# Patient Record
Sex: Female | Born: 1960
Health system: Southern US, Community
[De-identification: ages and names within clinical notes are randomized; demographics above are authoritative.]

## PROBLEM LIST (undated history)

## (undated) DIAGNOSIS — T7840XA Allergy, unspecified, initial encounter: Secondary | ICD-10-CM

## (undated) DIAGNOSIS — E079 Disorder of thyroid, unspecified: Secondary | ICD-10-CM

## (undated) DIAGNOSIS — E890 Postprocedural hypothyroidism: Secondary | ICD-10-CM

## (undated) DIAGNOSIS — G43909 Migraine, unspecified, not intractable, without status migrainosus: Secondary | ICD-10-CM

## (undated) DIAGNOSIS — E559 Vitamin D deficiency, unspecified: Secondary | ICD-10-CM

## (undated) DIAGNOSIS — K635 Polyp of colon: Secondary | ICD-10-CM

## (undated) DIAGNOSIS — E782 Mixed hyperlipidemia: Secondary | ICD-10-CM

## (undated) DIAGNOSIS — E042 Nontoxic multinodular goiter: Secondary | ICD-10-CM

## (undated) DIAGNOSIS — R7303 Prediabetes: Secondary | ICD-10-CM

## (undated) DIAGNOSIS — E049 Nontoxic goiter, unspecified: Secondary | ICD-10-CM

## (undated) DIAGNOSIS — Z8489 Family history of other specified conditions: Secondary | ICD-10-CM

## (undated) DIAGNOSIS — D649 Anemia, unspecified: Secondary | ICD-10-CM

## (undated) DIAGNOSIS — Z87442 Personal history of urinary calculi: Secondary | ICD-10-CM

## (undated) DIAGNOSIS — E0500 Thyrotoxicosis with diffuse goiter without thyrotoxic crisis or storm: Secondary | ICD-10-CM

## (undated) DIAGNOSIS — C3411 Malignant neoplasm of upper lobe, right bronchus or lung: Secondary | ICD-10-CM

## (undated) DIAGNOSIS — N200 Calculus of kidney: Secondary | ICD-10-CM

## (undated) HISTORY — DX: Nontoxic goiter, unspecified: E04.9

## (undated) HISTORY — DX: Disorder of thyroid, unspecified: E07.9

## (undated) HISTORY — PX: CHOLECYSTECTOMY: SHX55

## (undated) HISTORY — PX: ABDOMINAL HYSTERECTOMY: SHX81

## (undated) HISTORY — DX: Migraine, unspecified, not intractable, without status migrainosus: G43.909

## (undated) HISTORY — PX: LITHOTRIPSY: SUR834

## (undated) HISTORY — DX: Calculus of kidney: N20.0

## (undated) HISTORY — DX: Allergy, unspecified, initial encounter: T78.40XA

## (undated) HISTORY — PX: COLONOSCOPY: SHX174

---

## 1986-04-05 DIAGNOSIS — N200 Calculus of kidney: Secondary | ICD-10-CM

## 1986-04-05 HISTORY — DX: Calculus of kidney: N20.0

## 1998-04-05 HISTORY — PX: CHOLECYSTECTOMY: SHX55

## 2005-08-10 ENCOUNTER — Ambulatory Visit: Payer: Self-pay

## 2005-10-26 ENCOUNTER — Ambulatory Visit: Payer: Self-pay | Admitting: Gastroenterology

## 2006-09-15 ENCOUNTER — Ambulatory Visit: Payer: Self-pay

## 2007-09-19 ENCOUNTER — Ambulatory Visit: Payer: Self-pay

## 2008-09-24 ENCOUNTER — Ambulatory Visit: Payer: Self-pay

## 2009-03-10 ENCOUNTER — Ambulatory Visit: Payer: Self-pay | Admitting: Gastroenterology

## 2009-11-11 ENCOUNTER — Ambulatory Visit: Payer: Self-pay

## 2010-02-12 ENCOUNTER — Ambulatory Visit: Payer: Self-pay

## 2010-03-31 ENCOUNTER — Ambulatory Visit: Payer: Self-pay

## 2010-11-12 ENCOUNTER — Ambulatory Visit: Payer: Self-pay

## 2010-11-20 ENCOUNTER — Ambulatory Visit: Payer: Self-pay

## 2011-02-03 ENCOUNTER — Ambulatory Visit: Payer: Self-pay

## 2012-07-12 ENCOUNTER — Ambulatory Visit: Payer: Self-pay

## 2013-11-07 LAB — HM PAP SMEAR

## 2014-03-14 ENCOUNTER — Ambulatory Visit: Payer: Self-pay | Admitting: Gastroenterology

## 2014-03-14 LAB — HM COLONOSCOPY

## 2014-07-29 LAB — SURGICAL PATHOLOGY

## 2014-11-18 ENCOUNTER — Ambulatory Visit (INDEPENDENT_AMBULATORY_CARE_PROVIDER_SITE_OTHER): Payer: 59 | Admitting: Unknown Physician Specialty

## 2014-11-18 ENCOUNTER — Encounter: Payer: Self-pay | Admitting: Unknown Physician Specialty

## 2014-11-18 VITALS — BP 126/81 | HR 62 | Temp 98.2°F | Ht 67.1 in | Wt 292.0 lb

## 2014-11-18 DIAGNOSIS — R51 Headache: Secondary | ICD-10-CM

## 2014-11-18 DIAGNOSIS — E049 Nontoxic goiter, unspecified: Secondary | ICD-10-CM | POA: Insufficient documentation

## 2014-11-18 DIAGNOSIS — E039 Hypothyroidism, unspecified: Secondary | ICD-10-CM | POA: Insufficient documentation

## 2014-11-18 DIAGNOSIS — R519 Headache, unspecified: Secondary | ICD-10-CM | POA: Insufficient documentation

## 2014-11-18 DIAGNOSIS — G43909 Migraine, unspecified, not intractable, without status migrainosus: Secondary | ICD-10-CM | POA: Insufficient documentation

## 2014-11-18 DIAGNOSIS — E669 Obesity, unspecified: Secondary | ICD-10-CM

## 2014-11-18 DIAGNOSIS — K635 Polyp of colon: Secondary | ICD-10-CM

## 2014-11-18 NOTE — Progress Notes (Signed)
   BP 126/81 mmHg  Pulse 62  Temp(Src) 98.2 F (36.8 C)  Ht 5' 7.1" (1.704 m)  Wt 292 lb (132.45 kg)  BMI 45.62 kg/m2  SpO2 98%  LMP 09/17/2013 (Approximate)   Subjective:    Patient ID: Ann Robinson, female    DOB: 1960/08/14, 54 y.o.   MRN: [id number]  HPI: Ann Robinson is a 54 y.o. female  Chief Complaint  Patient presents with  . Weight Loss    pt wants to get help with weight loss   Obesity Pt has been trying to lose weight.  She has been trying since 2009.  She lost 30 pounds in the last year but 20 of it came back on.  She lost it by following weight watchers and dancing.  Her diet is now Oatmeal in the AM Lunch is a sandwich/fruit/cottage cheese, Supper is a meat and 2 vegetables and bread.     Relevant past medical, surgical, family and social history reviewed and updated as indicated. Interim medical history since our last visit reviewed. Allergies and medications reviewed and updated.  Review of Systems  Constitutional: Negative.   HENT: Negative.   Eyes: Negative.   Respiratory: Negative.   Cardiovascular: Negative.   Gastrointestinal: Negative.   Endocrine: Negative.   Genitourinary: Negative.   Musculoskeletal: Negative.   Skin: Negative.   Allergic/Immunologic: Negative.   Neurological: Negative.   Hematological: Negative.   Psychiatric/Behavioral: Negative.     Per HPI unless specifically indicated above     Objective:    BP 126/81 mmHg  Pulse 62  Temp(Src) 98.2 F (36.8 C)  Ht 5' 7.1" (1.704 m)  Wt 292 lb (132.45 kg)  BMI 45.62 kg/m2  SpO2 98%  LMP 09/17/2013 (Approximate)  Wt Readings from Last 3 Encounters:  11/18/14 292 lb (132.45 kg)  11/07/13 273 lb (123.832 kg)    Physical Exam  Constitutional: She is oriented to person, place, and time. She appears well-developed and well-nourished. No distress.  HENT:  Head: Normocephalic and atraumatic.  Eyes: Conjunctivae and lids are normal. Right eye exhibits no discharge. Left  eye exhibits no discharge. No scleral icterus.  Cardiovascular: Normal rate, regular rhythm and normal heart sounds.   Pulmonary/Chest: Effort normal and breath sounds normal. No respiratory distress.  Abdominal: Soft. Normal appearance and bowel sounds are normal. She exhibits no distension. There is no splenomegaly or hepatomegaly. There is no tenderness.  Musculoskeletal: Normal range of motion.  Neurological: She is alert and oriented to person, place, and time.  Skin: Skin is intact. No rash noted. No pallor.  Psychiatric: She has a normal mood and affect. Her behavior is normal. Judgment and thought content normal.      Assessment & Plan:   Problem List Items Addressed This Visit      Unprioritized   Obesity - Primary    Discussed diet changes with pt.  Handout given on HITT training.            Follow up plan: Return in about 3 months (around 02/18/2015) for f/u.

## 2014-11-18 NOTE — Assessment & Plan Note (Signed)
Discussed diet changes with pt.  Handout given on HITT training.

## 2014-11-18 NOTE — Patient Instructions (Signed)
Consider intermittent fasting.  Pick 2 days a week and eat 600 calories.   Consider just counting carbs and try to stay below 30 grams/day.    Think you're too busy to work out? We have the workout for you. In minutes, high-intensity interval training (H.I.I.T.) will have you sweating, breathing hard and maximizing the health benefits of exercise without the time commitment. Best of all, it's scientifically proven to work.  What Is H.I.I.T.? SHORT WORKOUTS 101 High-intensity interval training - referred to as H.I.I.T. - is based on the idea that short bursts of strenuous exercise can have a big impact on the body. If moderate exercise - like a 20-minute jog - is good for your heart, lungs and metabolism, H.I.I.T. packs the benefits of that workout and more into a few minutes. It may sound too good to be true, but learning this exercise technique and adapting it to your life can mean saving hours at the gym. If you think you don't have time to exercise, H.I.I.T. may be the workout for you.  You can try it with any aerobic activity you like. The principles of H.I.I.T. can be applied to running, biking, stair climbing, swimming, jumping rope, rowing, even hopping or skipping. (Yes, skipping!)  The downside? Even though H.I.I.T. lasts only minutes, the workouts are tough, requiring you to push your body near its limit.  HOW INTENSE IS HIGH INTENSITY? High-intensity exercise is obviously not a casual stroll down the street, but it's not a run-till-your-lungs-pop explosion, either. Think breathless, not winded. Heart-pounding, not exploding. Legs pumping, but not uncontrolled.  You don't need any fancy heart rate monitors to do these workouts. Use cues from your body as a guide. In the middle of a high-intensity workout you should be able to say single words, but not complete whole sentences. So, if you can keep chatting to your workout partner during this workout, pump it up a few notches.  01-22-29  Training This simple program will help you make the most of a short workout by improving heart health and endurance. Try it with your favorite cardiovascular activity. The essentials of 01-22-29 training are simple. Run, ride or perhaps row on a rowing machine gently for 30 seconds, accelerate to a moderate pace for 20 seconds, then sprint as hard as you can for 10 seconds. (It should be called 30-20-10 training, obviously, but that is not as catchy.) Repeat.  You don't even need a stopwatch to monitor the 30-, 20-, and 10-second time changes. You can just count to yourself, which seems to make the intervals pass more quickly.  Best of all? The grueling, all-out portion of the workout lasts for only 10 seconds. C'mon, you can do anything for 10 seconds, right?  Got 10 Minutes? A solitary minute of hard work buried in 10 minutes of activity can make a big difference.  The 10-Minute Workout If you like to run, bike, row or swim - just a little bit - this workout is a great option for you. Step 1 Warm up for 2 minutes Step 2 Pedal, run or swim all-out for 20 seconds. Repeat 2 more times Warm down for 3 minutes    GET STARTED To benefit the most from really, really short workouts, you need to build the habit of doing them into your hectic life. Ideally, you'll complete the workout three times a week. The best way to build that habit is to start small and be willing to tweak your schedule where you can  to accommodate your new workout.  First set up a spot in your house for your workout, equipped with whatever you need to get the job done: sneakers, a chair, a towel, etc. Then slot your workout in before you would normally shower. (You can even do it in the bathroom.) Or wake up five minutes earlier and do it first thing in the morning, so you can head off to work feeling accomplished. Or do it during your lunch hour. Run up your office's stairs or grab a private conference room for just a few  minutes. Or work it into your commute. If you walk or bike to work, add some heavy intervals on the way home.  GET A BOOST FROM MUSIC Creating a workout playlist of high-energy tunes you love will not make your workout feel easier, but it may cause you to exercise harder without even realizing it. Best of all, if you are doing a really short workout, you need only one or two great tunes to get you through. If you are willing to try something a bit different, make your own music as you exercise. Sing, hum, clap your hands, whatever you can do to jam along to your playlist. It may give you an extra boost to finish strong.  Find a song or podcast that's the length of your really, really short workout. By the time the song is over, you're done.  Excerpted from the [employer] Well column http://www.nytimes.com/well/guides/really-really-short-workouts?smid=fb-nytwell&smtyp=pay

## 2015-02-18 ENCOUNTER — Ambulatory Visit: Payer: 59 | Admitting: Unknown Physician Specialty

## 2015-02-25 ENCOUNTER — Ambulatory Visit (INDEPENDENT_AMBULATORY_CARE_PROVIDER_SITE_OTHER): Payer: 59 | Admitting: Unknown Physician Specialty

## 2015-02-25 ENCOUNTER — Encounter: Payer: Self-pay | Admitting: Unknown Physician Specialty

## 2015-02-25 VITALS — BP 118/73 | HR 64 | Temp 97.6°F | Ht 67.7 in | Wt 294.0 lb

## 2015-02-25 DIAGNOSIS — E669 Obesity, unspecified: Secondary | ICD-10-CM | POA: Diagnosis not present

## 2015-02-25 DIAGNOSIS — Z23 Encounter for immunization: Secondary | ICD-10-CM

## 2015-02-25 NOTE — Patient Instructions (Addendum)
Eat Fat Get Thin by Dr Mauro Kaufmann Why we get Fat and What to do About it Aleene Davidson Ketogenic diet

## 2015-02-25 NOTE — Assessment & Plan Note (Signed)
Discussed diets

## 2015-02-25 NOTE — Progress Notes (Signed)
   BP 118/73 mmHg  Pulse 64  Temp(Src) 97.6 F (36.4 C)  Ht 5' 7.7" (1.72 m)  Wt 294 lb (133.358 kg)  BMI 45.08 kg/m2  SpO2 99%  LMP 09/17/2013 (Approximate)   Subjective:    Patient ID: Ann Robinson, female    DOB: May 25, 1960, 54 y.o.   MRN: [id number]  HPI: Ann Robinson is a 54 y.o. female  Chief Complaint  Patient presents with  . Hypothyroidism   Hypothyroid Cared for by Dr Gabriel Carina  Weight Having a hard time losing weight.  She states she has had some foot problems and having trouble exercising.  She is planning on joining weight watchers.   She does not want medications.      Relevant past medical, surgical, family and social history reviewed and updated as indicated. Interim medical history since our last visit reviewed. Allergies and medications reviewed and updated.  Review of Systems  Constitutional: Negative.   HENT: Negative.   Eyes: Negative.   Respiratory: Negative.   Cardiovascular: Negative.   Gastrointestinal: Negative.   Endocrine: Negative.   Genitourinary: Negative.   Musculoskeletal: Negative.   Skin: Negative.   Allergic/Immunologic: Negative.   Neurological: Negative.   Hematological: Negative.   Psychiatric/Behavioral: Negative.     Per HPI unless specifically indicated above     Objective:    BP 118/73 mmHg  Pulse 64  Temp(Src) 97.6 F (36.4 C)  Ht 5' 7.7" (1.72 m)  Wt 294 lb (133.358 kg)  BMI 45.08 kg/m2  SpO2 99%  LMP 09/17/2013 (Approximate)  Wt Readings from Last 3 Encounters:  02/25/15 294 lb (133.358 kg)  11/18/14 292 lb (132.45 kg)  11/07/13 273 lb (123.832 kg)    Physical Exam  Constitutional: She is oriented to person, place, and time. She appears well-developed and well-nourished. No distress.  HENT:  Head: Normocephalic and atraumatic.  Eyes: Conjunctivae and lids are normal. Right eye exhibits no discharge. Left eye exhibits no discharge. No scleral icterus.  Cardiovascular: Normal rate and regular  rhythm.   Pulmonary/Chest: Effort normal and breath sounds normal. No respiratory distress.  Abdominal: Normal appearance. There is no splenomegaly or hepatomegaly.  Musculoskeletal: Normal range of motion.  Neurological: She is alert and oriented to person, place, and time.  Skin: Skin is intact. No rash noted. No pallor.  Psychiatric: She has a normal mood and affect. Her behavior is normal. Judgment and thought content normal.    Results for orders placed or performed in visit on 11/18/14  HM PAP SMEAR  Result Value Ref Range   HM Pap smear from [facility]   HM COLONOSCOPY  Result Value Ref Range   HM Colonoscopy from [facility]       Assessment & Plan:   Problem List Items Addressed This Visit      Unprioritized   Obesity    Discussed diets       Other Visit Diagnoses    Immunization due    -  Primary    Relevant Orders    Flu Vaccine QUAD 36+ mos IM (Completed)         Follow up plan: Return for schedule PE  .

## 2015-04-23 ENCOUNTER — Ambulatory Visit (INDEPENDENT_AMBULATORY_CARE_PROVIDER_SITE_OTHER): Payer: 59 | Admitting: Unknown Physician Specialty

## 2015-04-23 ENCOUNTER — Encounter: Payer: Self-pay | Admitting: Unknown Physician Specialty

## 2015-04-23 VITALS — BP 132/82 | HR 61 | Temp 97.5°F | Ht 67.2 in | Wt 304.4 lb

## 2015-04-23 DIAGNOSIS — E669 Obesity, unspecified: Secondary | ICD-10-CM | POA: Diagnosis not present

## 2015-04-23 DIAGNOSIS — E039 Hypothyroidism, unspecified: Secondary | ICD-10-CM | POA: Diagnosis not present

## 2015-04-23 DIAGNOSIS — H9192 Unspecified hearing loss, left ear: Secondary | ICD-10-CM

## 2015-04-23 DIAGNOSIS — Z0000 Encounter for general adult medical examination without abnormal findings: Secondary | ICD-10-CM

## 2015-04-23 NOTE — Assessment & Plan Note (Addendum)
Difficulty staying on diet.  Pt ed on HITT training

## 2015-04-23 NOTE — Patient Instructions (Signed)
Think you're too busy to work out? We have the workout for you. In minutes, high-intensity interval training (H.I.I.T.) will have you sweating, breathing hard and maximizing the health benefits of exercise without the time commitment. Best of all, it's scientifically proven to work.  What Is H.I.I.T.? SHORT WORKOUTS 101 High-intensity interval training - referred to as H.I.I.T. - is based on the idea that short bursts of strenuous exercise can have a big impact on the body. If moderate exercise - like a 20-minute jog - is good for your heart, lungs and metabolism, H.I.I.T. packs the benefits of that workout and more into a few minutes. It may sound too good to be true, but learning this exercise technique and adapting it to your life can mean saving hours at the gym. If you think you don't have time to exercise, H.I.I.T. may be the workout for you.  You can try it with any aerobic activity you like. The principles of H.I.I.T. can be applied to running, biking, stair climbing, swimming, jumping rope, rowing, even hopping or skipping. (Yes, skipping!)  The downside? Even though H.I.I.T. lasts only minutes, the workouts are tough, requiring you to push your body near its limit.  HOW INTENSE IS HIGH INTENSITY? High-intensity exercise is obviously not a casual stroll down the street, but it's not a run-till-your-lungs-pop explosion, either. Think breathless, not winded. Heart-pounding, not exploding. Legs pumping, but not uncontrolled.  You don't need any fancy heart rate monitors to do these workouts. Use cues from your body as a guide. In the middle of a high-intensity workout you should be able to say single words, but not complete whole sentences. So, if you can keep chatting to your workout partner during this workout, pump it up a few notches.  01-22-29 Training This simple program will help you make the most of a short workout by improving heart health and endurance. Try it with your favorite  cardiovascular activity. The essentials of 01-22-29 training are simple. Run, ride or perhaps row on a rowing machine gently for 30 seconds, accelerate to a moderate pace for 20 seconds, then sprint as hard as you can for 10 seconds. (It should be called 30-20-10 training, obviously, but that is not as catchy.) Repeat.  You don't even need a stopwatch to monitor the 30-, 20-, and 10-second time changes. You can just count to yourself, which seems to make the intervals pass more quickly.  Best of all? The grueling, all-out portion of the workout lasts for only 10 seconds. C'mon, you can do anything for 10 seconds, right?  Got 10 Minutes? A solitary minute of hard work buried in 10 minutes of activity can make a big difference.  The 10-Minute Workout If you like to run, bike, row or swim - just a little bit - this workout is a great option for you. Step 1 Warm up for 2 minutes Step 2 Pedal, run or swim all-out for 20 seconds. Repeat 2 more times Warm down for 3 minutes    GET STARTED To benefit the most from really, really short workouts, you need to build the habit of doing them into your hectic life. Ideally, you'll complete the workout three times a week. The best way to build that habit is to start small and be willing to tweak your schedule where you can to accommodate your new workout.  First set up a spot in your house for your workout, equipped with whatever you need to get the job done: sneakers, a  chair, a towel, etc. Then slot your workout in before you would normally shower. (You can even do it in the bathroom.) Or wake up five minutes earlier and do it first thing in the morning, so you can head off to work feeling accomplished. Or do it during your lunch hour. Run up your office's stairs or grab a private conference room for just a few minutes. Or work it into your commute. If you walk or bike to work, add some heavy intervals on the way home.  GET A BOOST FROM MUSIC Creating a  workout playlist of high-energy tunes you love will not make your workout feel easier, but it may cause you to exercise harder without even realizing it. Best of all, if you are doing a really short workout, you need only one or two great tunes to get you through. If you are willing to try something a bit different, make your own music as you exercise. Sing, hum, clap your hands, whatever you can do to jam along to your playlist. It may give you an extra boost to finish strong.  Find a song or podcast that's the length of your really, really short workout. By the time the song is over, you're done.  Excerpted from the [employer] Well column http://www.nytimes.com/well/guides/really-really-short-workouts?smid=fb-nytwell&smtyp=pay  

## 2015-04-23 NOTE — Progress Notes (Signed)
--+++++++++++++-  BP 132/82 mmHg  Pulse 61  Temp(Src) 97.5 F (36.4 C)  Ht 5' 7.2" (1.707 m)  Wt 304 lb 6.4 oz (138.075 kg)  BMI 47.39 kg/m2  SpO2 98%  LMP 09/17/2013 (Approximate)   Subjective:    Patient ID: Ann Robinson, female    DOB: 1961-02-03, 55 y.o.   MRN: [id number]  HPI: Ann Robinson is a 55 y.o. female  Chief Complaint  Patient presents with  . Annual Exam    pt states she has been having trouble hearing out of her left ear    Relevant past medical, surgical, family and social history reviewed and updated as indicated. Interim medical history since our last visit reviewed. Allergies and medications reviewed and updated.  Review of Systems  Constitutional: Positive for unexpected weight change.       Wants thyroid checked again  HENT:       Decreased hearing right ear  Eyes: Negative.   Respiratory: Negative.   Cardiovascular: Negative.   Gastrointestinal: Negative.   Endocrine: Negative.   Genitourinary: Negative.   Musculoskeletal: Negative.   Skin: Negative.   Allergic/Immunologic: Negative.   Neurological: Negative.   Hematological: Negative.   Psychiatric/Behavioral: Negative.     Per HPI unless specifically indicated above     Objective:    BP 132/82 mmHg  Pulse 61  Temp(Src) 97.5 F (36.4 C)  Ht 5' 7.2" (1.707 m)  Wt 304 lb 6.4 oz (138.075 kg)  BMI 47.39 kg/m2  SpO2 98%  LMP 09/17/2013 (Approximate)  Wt Readings from Last 3 Encounters:  04/23/15 304 lb 6.4 oz (138.075 kg)  02/25/15 294 lb (133.358 kg)  11/18/14 292 lb (132.45 kg)    Physical Exam  Constitutional: She is oriented to person, place, and time. She appears well-developed and well-nourished.  HENT:  Head: Normocephalic and atraumatic.  Eyes: Pupils are equal, round, and reactive to light. Right eye exhibits no discharge. Left eye exhibits no discharge. No scleral icterus.  Neck: Normal range of motion. Neck supple. Carotid bruit is not present. No thyromegaly  present.  Cardiovascular: Normal rate, regular rhythm and normal heart sounds.  Exam reveals no gallop and no friction rub.   No murmur heard. Pulmonary/Chest: Effort normal and breath sounds normal. No respiratory distress. She has no wheezes. She has no rales.  Abdominal: Soft. Bowel sounds are normal. There is no tenderness. There is no rebound.  Genitourinary: No breast swelling, tenderness or discharge.  Musculoskeletal: Normal range of motion.  Lymphadenopathy:    She has no cervical adenopathy.  Neurological: She is alert and oriented to person, place, and time.  Skin: Skin is warm, dry and intact. No rash noted.  Psychiatric: She has a normal mood and affect. Her speech is normal and behavior is normal. Judgment and thought content normal. Cognition and memory are normal.    Results for orders placed or performed in visit on 11/18/14  HM PAP SMEAR  Result Value Ref Range   HM Pap smear from [facility]   HM COLONOSCOPY  Result Value Ref Range   HM Colonoscopy from [facility]       Assessment & Plan:   Problem List Items Addressed This Visit      Unprioritized   Hypothyroidism   Relevant Orders   TSH   Obesity    Difficulty staying on diet.  Pt ed on HITT training       Other Visit Diagnoses    Routine general medical examination at a  health care facility    -  Primary    Relevant Orders    Hepatitis C antibody    HIV antibody    CBC with Differential/Platelet    Comprehensive metabolic panel    TSH    Lipid Panel w/o Chol/HDL Ratio    Hearing loss, left        Refer to ENT with normal ear exam    Relevant Orders    Ambulatory referral to ENT        Follow up plan: Return in about 1 year (around 04/22/2016).

## 2015-04-24 LAB — COMPREHENSIVE METABOLIC PANEL
ALBUMIN: 3.9 g/dL (ref 3.5–5.5)
ALK PHOS: 78 IU/L (ref 39–117)
ALT: 14 IU/L (ref 0–32)
AST: 15 IU/L (ref 0–40)
Albumin/Globulin Ratio: 1.5 (ref 1.1–2.5)
BUN/Creatinine Ratio: 10 (ref 9–23)
BUN: 6 mg/dL (ref 6–24)
Bilirubin Total: 0.2 mg/dL (ref 0.0–1.2)
CALCIUM: 9.3 mg/dL (ref 8.7–10.2)
CO2: 24 mmol/L (ref 18–29)
CREATININE: 0.59 mg/dL (ref 0.57–1.00)
Chloride: 102 mmol/L (ref 96–106)
GFR calc Af Amer: 119 mL/min/{1.73_m2} (ref >59)
GFR, EST NON AFRICAN AMERICAN: 104 mL/min/{1.73_m2} (ref >59)
GLOBULIN, TOTAL: 2.6 g/dL (ref 1.5–4.5)
GLUCOSE: 81 mg/dL (ref 65–99)
Potassium: 4.2 mmol/L (ref 3.5–5.2)
Sodium: 142 mmol/L (ref 134–144)
Total Protein: 6.5 g/dL (ref 6.0–8.5)

## 2015-04-24 LAB — CBC WITH DIFFERENTIAL/PLATELET
BASOS ABS: 0 10*3/uL (ref 0.0–0.2)
Basos: 1 %
EOS (ABSOLUTE): 0.2 10*3/uL (ref 0.0–0.4)
Eos: 3 %
HEMOGLOBIN: 13.1 g/dL (ref 11.1–15.9)
Hematocrit: 40.4 % (ref 34.0–46.6)
IMMATURE GRANS (ABS): 0 10*3/uL (ref 0.0–0.1)
Immature Granulocytes: 0 %
LYMPHS: 37 %
Lymphocytes Absolute: 2.5 10*3/uL (ref 0.7–3.1)
MCH: 26.7 pg (ref 26.6–33.0)
MCHC: 32.4 g/dL (ref 31.5–35.7)
MCV: 82 fL (ref 79–97)
MONOCYTES: 5 %
Monocytes Absolute: 0.3 10*3/uL (ref 0.1–0.9)
NEUTROS ABS: 3.7 10*3/uL (ref 1.4–7.0)
Neutrophils: 54 %
Platelets: 347 10*3/uL (ref 150–379)
RBC: 4.9 x10E6/uL (ref 3.77–5.28)
RDW: 16.1 % — ABNORMAL HIGH (ref 12.3–15.4)
WBC: 6.6 10*3/uL (ref 3.4–10.8)

## 2015-04-24 LAB — LIPID PANEL W/O CHOL/HDL RATIO
CHOLESTEROL TOTAL: 269 mg/dL — AB (ref 100–199)
HDL: 65 mg/dL (ref >39)
LDL Calculated: 192 mg/dL — ABNORMAL HIGH (ref 0–99)
TRIGLYCERIDES: 59 mg/dL (ref 0–149)
VLDL CHOLESTEROL CAL: 12 mg/dL (ref 5–40)

## 2015-04-24 LAB — HIV ANTIBODY (ROUTINE TESTING W REFLEX): HIV SCREEN 4TH GENERATION: NONREACTIVE

## 2015-04-24 LAB — TSH: TSH: 7.28 u[IU]/mL — AB (ref 0.450–4.500)

## 2015-04-24 LAB — HEPATITIS C ANTIBODY

## 2015-06-09 ENCOUNTER — Encounter: Payer: Self-pay | Admitting: Family Medicine

## 2015-06-09 ENCOUNTER — Ambulatory Visit (INDEPENDENT_AMBULATORY_CARE_PROVIDER_SITE_OTHER): Payer: 59 | Admitting: Family Medicine

## 2015-06-09 VITALS — BP 114/75 | HR 67 | Temp 98.0°F | Ht 67.9 in | Wt 308.0 lb

## 2015-06-09 DIAGNOSIS — J019 Acute sinusitis, unspecified: Secondary | ICD-10-CM

## 2015-06-09 MED ORDER — AMOXICILLIN 875 MG PO TABS: 875.0000 mg | ORAL_TABLET | Freq: Two times a day (BID) | ORAL | Status: DC

## 2015-06-09 NOTE — Progress Notes (Signed)
BP 114/75 mmHg  Pulse 67  Temp(Src) 98 F (36.7 C)  Ht 5' 7.9" (1.725 m)  Wt 308 lb (139.708 kg)  BMI 46.95 kg/m2  SpO2 99%  LMP 09/17/2013 (Approximate)   Subjective:    Patient ID: Ann Robinson, female    DOB: 1961/03/23, 55 y.o.   MRN: [id number]  HPI: Ann Robinson is a 55 y.o. female  Chief Complaint  Patient presents with  . URI    x 1 week - worsened over weekend   patient with sinus infection had pressure when bending over sloshing-type sensation a lot of congestion drainage cough cold fever or chills symptoms started To Slowly with Head Cold and Then Got Worse over This Last Week  Relevant past medical, surgical, family and social history reviewed and updated as indicated. Interim medical history since our last visit reviewed. Allergies and medications reviewed and updated.  Review of Systems  Constitutional: Positive for fever, chills, diaphoresis and fatigue.  HENT: Positive for congestion, postnasal drip, rhinorrhea, sinus pressure, sneezing and sore throat.   Respiratory: Positive for cough. Negative for shortness of breath.   Cardiovascular: Negative.     Per HPI unless specifically indicated above     Objective:    BP 114/75 mmHg  Pulse 67  Temp(Src) 98 F (36.7 C)  Ht 5' 7.9" (1.725 m)  Wt 308 lb (139.708 kg)  BMI 46.95 kg/m2  SpO2 99%  LMP 09/17/2013 (Approximate)  Wt Readings from Last 3 Encounters:  06/09/15 308 lb (139.708 kg)  04/23/15 304 lb 6.4 oz (138.075 kg)  02/25/15 294 lb (133.358 kg)    Physical Exam  Constitutional: She is oriented to person, place, and time. She appears well-developed and well-nourished. No distress.  HENT:  Head: Normocephalic and atraumatic.  Right Ear: Hearing and external ear normal.  Left Ear: Hearing and external ear normal.  Nose: Nose normal.  Mouth/Throat: Oropharyngeal exudate present.  Eyes: Conjunctivae and lids are normal. Right eye exhibits no discharge. Left eye exhibits no  discharge. No scleral icterus.  Neck: No tracheal deviation present. No thyromegaly present.  Cardiovascular: Normal rate, regular rhythm and normal heart sounds.   Pulmonary/Chest: Effort normal and breath sounds normal. No respiratory distress.  Musculoskeletal: Normal range of motion.  Lymphadenopathy:    She has no cervical adenopathy.  Neurological: She is alert and oriented to person, place, and time.  Skin: Skin is intact. No rash noted.  Psychiatric: She has a normal mood and affect. Her speech is normal and behavior is normal. Judgment and thought content normal. Cognition and memory are normal.    Results for orders placed or performed in visit on 04/23/15  Hepatitis C antibody  Result Value Ref Range   Hep C Virus Ab <0.1 0.0 - 0.9 s/co ratio  HIV antibody  Result Value Ref Range   HIV Screen 4th Generation wRfx Non Reactive Non Reactive  CBC with Differential/Platelet  Result Value Ref Range   WBC 6.6 3.4 - 10.8 x10E3/uL   RBC 4.90 3.77 - 5.28 x10E6/uL   Hemoglobin 13.1 11.1 - 15.9 g/dL   Hematocrit 40.4 34.0 - 46.6 %   MCV 82 79 - 97 fL   MCH 26.7 26.6 - 33.0 pg   MCHC 32.4 31.5 - 35.7 g/dL   RDW 16.1 (H) 12.3 - 15.4 %   Platelets 347 150 - 379 x10E3/uL   Neutrophils 54 %   Lymphs 37 %   Monocytes 5 %   Eos 3 %  Basos 1 %   Neutrophils Absolute 3.7 1.4 - 7.0 x10E3/uL   Lymphocytes Absolute 2.5 0.7 - 3.1 x10E3/uL   Monocytes Absolute 0.3 0.1 - 0.9 x10E3/uL   EOS (ABSOLUTE) 0.2 0.0 - 0.4 x10E3/uL   Basophils Absolute 0.0 0.0 - 0.2 x10E3/uL   Immature Granulocytes 0 %   Immature Grans (Abs) 0.0 0.0 - 0.1 x10E3/uL  Comprehensive metabolic panel  Result Value Ref Range   Glucose 81 65 - 99 mg/dL   BUN 6 6 - 24 mg/dL   Creatinine, Ser 0.59 0.57 - 1.00 mg/dL   GFR calc non Af Amer 104 >59 mL/min/1.73   GFR calc Af Amer 119 >59 mL/min/1.73   BUN/Creatinine Ratio 10 9 - 23   Sodium 142 134 - 144 mmol/L   Potassium 4.2 3.5 - 5.2 mmol/L   Chloride 102 96 - 106  mmol/L   CO2 24 18 - 29 mmol/L   Calcium 9.3 8.7 - 10.2 mg/dL   Total Protein 6.5 6.0 - 8.5 g/dL   Albumin 3.9 3.5 - 5.5 g/dL   Globulin, Total 2.6 1.5 - 4.5 g/dL   Albumin/Globulin Ratio 1.5 1.1 - 2.5   Bilirubin Total 0.2 0.0 - 1.2 mg/dL   Alkaline Phosphatase 78 39 - 117 IU/L   AST 15 0 - 40 IU/L   ALT 14 0 - 32 IU/L  TSH  Result Value Ref Range   TSH 7.280 (H) 0.450 - 4.500 uIU/mL  Lipid Panel w/o Chol/HDL Ratio  Result Value Ref Range   Cholesterol, Total 269 (H) 100 - 199 mg/dL   Triglycerides 59 0 - 149 mg/dL   HDL 65 >39 mg/dL   VLDL Cholesterol Cal 12 5 - 40 mg/dL   LDL Calculated 192 (H) 0 - 99 mg/dL   Comment: Comment       Assessment & Plan:   Problem List Items Addressed This Visit    None    Visit Diagnoses    Acute sinusitis, recurrence not specified, unspecified location    -  Primary    Relevant Medications    amoxicillin (AMOXIL) 875 MG tablet        Follow up plan: Return for As scheduled.

## 2015-12-12 ENCOUNTER — Ambulatory Visit: Payer: 59 | Admitting: Family Medicine

## 2015-12-15 ENCOUNTER — Ambulatory Visit: Payer: 59 | Admitting: Family Medicine

## 2015-12-17 ENCOUNTER — Ambulatory Visit (INDEPENDENT_AMBULATORY_CARE_PROVIDER_SITE_OTHER): Payer: 59 | Admitting: Family Medicine

## 2015-12-17 ENCOUNTER — Encounter: Payer: Self-pay | Admitting: Family Medicine

## 2015-12-17 VITALS — BP 103/68 | HR 58 | Temp 97.5°F | Ht 68.0 in | Wt 307.0 lb

## 2015-12-17 DIAGNOSIS — E669 Obesity, unspecified: Secondary | ICD-10-CM | POA: Diagnosis not present

## 2015-12-17 DIAGNOSIS — Z23 Encounter for immunization: Secondary | ICD-10-CM

## 2015-12-17 NOTE — Patient Instructions (Addendum)

## 2015-12-17 NOTE — Progress Notes (Signed)
BP 103/68 (BP Location: Right Arm, Patient Position: Sitting, Cuff Size: Large)   Pulse (!) 58   Temp 97.5 F (36.4 C)   Ht 5\' 8"  (1.727 m)   Wt (!) 307 lb (139.3 kg)   LMP 09/17/2013 (Approximate)   SpO2 100%   BMI 46.68 kg/m    Subjective:    Patient ID: Ann Robinson, female    DOB: [birth year], 55 y.o.   MRN: [id number]  HPI: Ann Robinson is a 55 y.o. female  Chief Complaint  Patient presents with  . weight management    pt states she needs a form signed for her job about her weight    Patient presents to discuss weight management. Has been trying to lose weight for several years with little success. Can't keep weight off when she does lose it historically. Started [employer] last week and that is going well so far. Has lost 5-7 lb over the past few weeks so far. Has not been doing much as far as physical activity due to her work hours. Is going to try and start back on the elliptical and swing dancing several times weekly. Lives alone. Does have a coworker who is also working on her weight, so they keep each other accountable and encourage each other.  Does feel fatigued and achy most of the time, but states she is already starting to have more energy with the modest weight loss she's had the past few weeks. Recently had thyroid medicine adjusted with endocrinology as well which could be playing a part.   Relevant past medical, surgical, family and social history reviewed and updated as indicated. Interim medical history since our last visit reviewed. Allergies and medications reviewed and updated.  Review of Systems  Constitutional: Positive for fatigue.  HENT: Negative.   Respiratory: Negative.   Cardiovascular: Negative.   Gastrointestinal: Negative.   Musculoskeletal: Negative.   Skin: Negative.   Neurological: Negative.   Psychiatric/Behavioral: Negative.     Per HPI unless specifically indicated above     Objective:    BP 103/68 (BP Location: Right  Arm, Patient Position: Sitting, Cuff Size: Large)   Pulse (!) 58   Temp 97.5 F (36.4 C)   Ht 5\' 8"  (1.727 m)   Wt (!) 307 lb (139.3 kg)   LMP 09/17/2013 (Approximate)   SpO2 100%   BMI 46.68 kg/m   Wt Readings from Last 3 Encounters:  12/17/15 (!) 307 lb (139.3 kg)  06/09/15 (!) 308 lb (139.7 kg)  04/23/15 (!) 304 lb 6.4 oz (138.1 kg)    Physical Exam  Constitutional: She is oriented to person, place, and time. She appears well-developed and well-nourished.  HENT:  Head: Atraumatic.  Eyes: Conjunctivae are normal. No scleral icterus.  Neck: Normal range of motion. Neck supple.  Cardiovascular: Normal rate, regular rhythm and normal heart sounds.   Pulmonary/Chest: Effort normal and breath sounds normal. No respiratory distress.  Musculoskeletal: Normal range of motion.  Neurological: She is alert and oriented to person, place, and time.  Skin: Skin is warm and dry.  Psychiatric: She has a normal mood and affect. Her behavior is normal.  Nursing note and vitals reviewed.     Assessment & Plan:   Problem List Items Addressed This Visit      Other   Obesity - Primary    Filled out paperwork for work regarding her plans to reduce her BMI. Discussed reasonable diet and exercise strategies, patient motivated and eager. Will be  getting back into dancing and using the elliptical, and discussed HIIT workouts for days when work prevents going to work out in gym.        Other Visit Diagnoses    Immunization due       Relevant Orders   Flu Vaccine QUAD 36+ mos IM (Completed)       Follow up plan: Return for as scheduled.

## 2015-12-17 NOTE — Assessment & Plan Note (Signed)
Filled out paperwork for work regarding her plans to reduce her BMI. Discussed reasonable diet and exercise strategies, patient motivated and eager. Will be getting back into dancing and using the elliptical, and discussed HIIT workouts for days when work prevents going to work out in gym.

## 2016-03-05 DIAGNOSIS — N8502 Endometrial intraepithelial neoplasia [EIN]: Secondary | ICD-10-CM

## 2016-03-05 HISTORY — DX: Endometrial intraepithelial neoplasia (EIN): N85.02

## 2016-03-17 ENCOUNTER — Inpatient Hospital Stay: Payer: 59

## 2016-03-17 ENCOUNTER — Inpatient Hospital Stay: Payer: 59 | Attending: Obstetrics and Gynecology | Admitting: Obstetrics and Gynecology

## 2016-03-17 VITALS — BP 143/95 | HR 63 | Temp 97.6°F | Ht 68.0 in | Wt 299.8 lb

## 2016-03-17 DIAGNOSIS — Z6841 Body Mass Index (BMI) 40.0 and over, adult: Secondary | ICD-10-CM | POA: Insufficient documentation

## 2016-03-17 DIAGNOSIS — Z79899 Other long term (current) drug therapy: Secondary | ICD-10-CM | POA: Diagnosis not present

## 2016-03-17 DIAGNOSIS — E049 Nontoxic goiter, unspecified: Secondary | ICD-10-CM

## 2016-03-17 DIAGNOSIS — N8502 Endometrial intraepithelial neoplasia [EIN]: Secondary | ICD-10-CM

## 2016-03-17 DIAGNOSIS — E669 Obesity, unspecified: Secondary | ICD-10-CM | POA: Diagnosis not present

## 2016-03-17 DIAGNOSIS — E039 Hypothyroidism, unspecified: Secondary | ICD-10-CM | POA: Diagnosis not present

## 2016-03-17 NOTE — H&P (Signed)
Gynecologic Oncology Consult Visit   Referring Provider: Dr. Malachy Mood, MD [facility]  Chief Concern: complex hyperplasia with atypia.  Subjective:  Ann Robinson is a 55 y.o. female who is seen in consultation from Dr. Georgianne Fick for complex hyperplasia with atypia.  She presented with postmenopausal vaginal bleeding. Evaluation included pelvic ultrasound on that was abnormal with thickened endometrial stripe. Uterus 7x4x5 cm. EMBx 9.65 mm. Left ovary normal size. Right ovary not visualized.   EMBx 03/02/2016: focal complex atypical hyperplasia probably arising in an endometrial polyp  She presents today to review management options.    Problem List: Patient Active Problem List   Diagnosis Date Noted  . Complex atypical endometrial hyperplasia 03/17/2016  . Polyp of colon 11/18/2014  . Hypothyroidism 11/18/2014  . Goiter 11/18/2014  . Headache 11/18/2014  . Migraine 11/18/2014  . Obesity 11/18/2014    Past Medical History: Past Medical History:  Diagnosis Date  . Goiter   . Migraines   . Nephrolithiasis 1988  . Thyroid disease     Past Surgical History: Past Surgical History:  Procedure Laterality Date  . CHOLECYSTECTOMY      Past Gynecologic History:  Menarche: 11 Last pap: 11/16/11 Pap negative   Health care screening: Colonoscopy nomral 01/2009, 2015  OB History:  OB History  Gravida Para Term Preterm AB Living  2 2       2   SAB TAB Ectopic Multiple Live Births               # Outcome Date GA Lbr Len/2nd Weight Sex Delivery Anes PTL Lv  2 Para           1 Para               Family History: Family History  Problem Relation Age of Onset  . Cancer Mother     colon and breast  . Diabetes Mother   . Alzheimer's disease Father   . Heart disease Father   . Hypertension Father   . Diabetes Father   . Diabetes Sister   . Hypertension Sister   . Diabetes Brother   . Liver disease Brother   . Heart disease Maternal  Grandfather     MI  . Stroke Paternal Grandmother   . Thyroid disease Sister   . Diabetes Brother     Social History: Social History   Social History  . Marital status: Widowed    Spouse name: N/A  . Number of children: N/A  . Years of education: N/A   Occupational History  . Not on file.   Social History Main Topics  . Smoking status: Never Smoker  . Smokeless tobacco: Never Used  . Alcohol use 0.0 oz/week     Comment: occasional  . Drug use: No  . Sexual activity: Yes   Other Topics Concern  . Not on file   Social History Narrative  . No narrative on file    Allergies: Allergies  Allergen Reactions  . Sulfa Antibiotics Rash    Current Medications: Current Outpatient Prescriptions  Medication Sig Dispense Refill  . cholecalciferol (VITAMIN D) 1000 UNITS tablet Take 1,000 Units by mouth 2 (two) times daily.    Marland Kitchen levothyroxine (SYNTHROID, LEVOTHROID) 125 MCG tablet Take 125 mcg by mouth daily.    . Multiple Vitamin (MULTI-VITAMINS) TABS Take 1 tablet by mouth daily.     . Omega-3 Fatty Acids (FISH OIL PO) Take by mouth daily.     No current facility-administered  medications for this visit.     Review of Systems General: no complaints  HEENT: no complaints  Lungs: no complaints  Cardiac: no complaints  GI: no complaints  GU: vaginal bleeding/spotting  Musculoskeletal: no complaints  Extremities: no complaints  Skin: no complaints  Neuro: no complaints  Endocrine: no complaints  Psych: no complaints       Objective:  Physical Examination:  BP (!) 143/95 (BP Location: Left Wrist, Patient Position: Sitting)   Pulse 63   Temp 97.6 F (36.4 C) (Tympanic)   Ht 5\' 8"  (1.727 m)   Wt 299 lb 13.2 oz (136 kg)   LMP 09/17/2013 (Approximate)   BMI 45.59 kg/m    ECOG Performance Status: 0 - Asymptomatic  General appearance: alert, cooperative and appears stated age HEENT:PERRLA, extra ocular movement intact and sclera clear, anicteric Lymph node  survey: non-palpable, axillary, inguinal, supraclavicular Cardiovascular: regular rate and rhythm Respiratory: normal air entry, lungs clear to auscultation Abdomen: soft, non-tender, without masses or organomegaly, protuberant, no hernias and well healed incision Back: inspection of back is normal Extremities: extremities normal, atraumatic, no cyanosis or edema Skin exam - normal coloration and turgor, no rashes, no suspicious skin lesions noted. Neurological exam reveals alert, oriented, normal speech, no focal findings or movement disorder noted.  Pelvic: exam chaperoned by nurse;  Vulva: normal appearing vulva with no masses, tenderness or lesions; Vagina: normal vagina; Adnexa: no masses, but limited by exam; Uterus: uterus is normal size, and nontender; Cervix: no lesions; Rectal: not indicated   Lab Review Labs on site today: [facility] ordered     Assessment:  Ann Robinson is a 55 y.o. female diagnosed with complex hyperplasia with atypia.    Medical co-morbidities complicating care: Body mass index is 45.59 kg/m.  Plan:   Problem List Items Addressed This Visit      Genitourinary   Complex atypical endometrial hyperplasia - Primary   Relevant Orders   Follicle stimulating hormone      We discussed options for management including surgery versus hormonal therapy. Based on completed fertility and desire for definitive therapy, I recommended definitive surgical evaluation with robotic assisted laparoscopic hysterectomy. She would like to preserve her ovaries as possible, she does not feel that she has completed menopause and would like to preserve hormonal function. Therefore if frozen negative for malignancy plan for bilateral salpingectomy, and if positive for malignancy then BSO. We will check [place] to assess for residual function as this may change her mind. Plan for SLN injection and mapping, with biopsy pending on frozen section results.   Risks were discussed in detail.  These include infection, anesthesia, bleeding, transfusion, wound separation, vaginal cuff dehiscence, medical issues (blood clots, stroke, heart attack, fluid in the lungs, pneumonia, abnormal heart rhythm, death), possible exploratory surgery with larger incision, lymphedema, lymphocyst, allergic reaction, injury to adjacent organs (bowel, bladder, blood vessels, nerves, ureters, uterus). Plan for OR 04/22/2015. I did offer her a progesterone therapy to prevent progression of CAH. She was given precautions today.   Suggested return to clinic in  4 weeks postop if she has a malignancy, otherwise she will follow up with Dr. Georgianne Fick.     We discussed that the etiology of this abnormality is due to obesity.   The patient's diagnosis, an outline of the further diagnostic and laboratory studies which will be required, the recommendation, and alternatives were discussed.  All questions were answered to the patient's satisfaction.   Gillis Ends, MD    CC:  Dr. Malachy Mood, MD [facility] [phone]

## 2016-03-17 NOTE — Progress Notes (Signed)
Gynecologic Oncology Consult Visit   Referring Provider: Dr. Malachy Mood, MD [facility]  Chief Concern: complex hyperplasia with atypia.  Subjective:  Ann Robinson is a 55 y.o. female who is seen in consultation from Dr. Georgianne Fick for complex hyperplasia with atypia.  She presented with postmenopausal vaginal bleeding. Evaluation included pelvic ultrasound on that was abnormal with thickened endometrial stripe. Uterus 7x4x5 cm. EMBx 9.65 mm. Left ovary normal size. Right ovary not visualized.   EMBx 03/02/2016: focal complex atypical hyperplasia probably arising in an endometrial polyp  She presents today to review management options.    Problem List: Patient Active Problem List   Diagnosis Date Noted  . Complex atypical endometrial hyperplasia 03/17/2016  . Polyp of colon 11/18/2014  . Hypothyroidism 11/18/2014  . Goiter 11/18/2014  . Headache 11/18/2014  . Migraine 11/18/2014  . Obesity 11/18/2014    Past Medical History: Past Medical History:  Diagnosis Date  . Goiter   . Migraines   . Nephrolithiasis 1988  . Thyroid disease     Past Surgical History: Past Surgical History:  Procedure Laterality Date  . CHOLECYSTECTOMY      Past Gynecologic History:  Menarche: 11 Last pap: 11/16/11 Pap negative   Health care screening: Colonoscopy nomral 01/2009, 2015  OB History:  OB History  Gravida Para Term Preterm AB Living  2 2       2   SAB TAB Ectopic Multiple Live Births               # Outcome Date GA Lbr Len/2nd Weight Sex Delivery Anes PTL Lv  2 Para           1 Para               Family History: Family History  Problem Relation Age of Onset  . Cancer Mother     colon and breast  . Diabetes Mother   . Alzheimer's disease Father   . Heart disease Father   . Hypertension Father   . Diabetes Father   . Diabetes Sister   . Hypertension Sister   . Diabetes Brother   . Liver disease Brother   . Heart disease Maternal  Grandfather     MI  . Stroke Paternal Grandmother   . Thyroid disease Sister   . Diabetes Brother     Social History: Social History   Social History  . Marital status: Widowed    Spouse name: N/A  . Number of children: N/A  . Years of education: N/A   Occupational History  . Not on file.   Social History Main Topics  . Smoking status: Never Smoker  . Smokeless tobacco: Never Used  . Alcohol use 0.0 oz/week     Comment: occasional  . Drug use: No  . Sexual activity: Yes   Other Topics Concern  . Not on file   Social History Narrative  . No narrative on file    Allergies: Allergies  Allergen Reactions  . Sulfa Antibiotics Rash    Current Medications: Current Outpatient Prescriptions  Medication Sig Dispense Refill  . cholecalciferol (VITAMIN D) 1000 UNITS tablet Take 1,000 Units by mouth 2 (two) times daily.    Marland Kitchen levothyroxine (SYNTHROID, LEVOTHROID) 125 MCG tablet Take 125 mcg by mouth daily.    . Multiple Vitamin (MULTI-VITAMINS) TABS Take 1 tablet by mouth daily.     . Omega-3 Fatty Acids (FISH OIL PO) Take by mouth daily.     No current facility-administered  medications for this visit.     Review of Systems General: no complaints  HEENT: no complaints  Lungs: no complaints  Cardiac: no complaints  GI: no complaints  GU: vaginal bleeding/spotting  Musculoskeletal: no complaints  Extremities: no complaints  Skin: no complaints  Neuro: no complaints  Endocrine: no complaints  Psych: no complaints       Objective:  Physical Examination:  BP (!) 143/95 (BP Location: Left Wrist, Patient Position: Sitting)   Pulse 63   Temp 97.6 F (36.4 C) (Tympanic)   Ht 5\' 8"  (1.727 m)   Wt 299 lb 13.2 oz (136 kg)   LMP 09/17/2013 (Approximate)   BMI 45.59 kg/m    ECOG Performance Status: 0 - Asymptomatic  General appearance: alert, cooperative and appears stated age HEENT:PERRLA, extra ocular movement intact and sclera clear, anicteric Lymph node  survey: non-palpable, axillary, inguinal, supraclavicular Cardiovascular: regular rate and rhythm Respiratory: normal air entry, lungs clear to auscultation Abdomen: soft, non-tender, without masses or organomegaly, protuberant, no hernias and well healed incision Back: inspection of back is normal Extremities: extremities normal, atraumatic, no cyanosis or edema Skin exam - normal coloration and turgor, no rashes, no suspicious skin lesions noted. Neurological exam reveals alert, oriented, normal speech, no focal findings or movement disorder noted.  Pelvic: exam chaperoned by nurse;  Vulva: normal appearing vulva with no masses, tenderness or lesions; Vagina: normal vagina; Adnexa: no masses, but limited by exam; Uterus: uterus is normal size, and nontender; Cervix: no lesions; Rectal: not indicated   Lab Review Labs on site today: [facility] ordered     Assessment:  Ann Robinson is a 55 y.o. female diagnosed with complex hyperplasia with atypia.    Medical co-morbidities complicating care: Body mass index is 45.59 kg/m.  Plan:   Problem List Items Addressed This Visit      Genitourinary   Complex atypical endometrial hyperplasia - Primary   Relevant Orders   Follicle stimulating hormone      We discussed options for management including surgery versus hormonal therapy. Based on completed fertility and desire for definitive therapy, I recommended definitive surgical evaluation with robotic assisted laparoscopic hysterectomy. She would like to preserve her ovaries as possible, she does not feel that she has completed menopause and would like to preserve hormonal function. Therefore if frozen negative for malignancy plan for bilateral salpingectomy, and if positive for malignancy then BSO. We will check [place] to assess for residual function as this may change her mind. Plan for SLN injection and mapping, with biopsy pending on frozen section results.   Risks were discussed in detail.  These include infection, anesthesia, bleeding, transfusion, wound separation, vaginal cuff dehiscence, medical issues (blood clots, stroke, heart attack, fluid in the lungs, pneumonia, abnormal heart rhythm, death), possible exploratory surgery with larger incision, lymphedema, lymphocyst, allergic reaction, injury to adjacent organs (bowel, bladder, blood vessels, nerves, ureters, uterus). Plan for OR 04/22/2015. I did offer her a progesterone therapy to prevent progression of CAH. She was given precautions today.   Suggested return to clinic in  4 weeks postop if she has a malignancy, otherwise she will follow up with Dr. Georgianne Fick.     We discussed that the etiology of this abnormality is due to obesity.   The patient's diagnosis, an outline of the further diagnostic and laboratory studies which will be required, the recommendation, and alternatives were discussed.  All questions were answered to the patient's satisfaction.   Gillis Ends, MD    CC:  Dr. Malachy Mood, MD [facility] [phone]

## 2016-03-17 NOTE — Progress Notes (Signed)
Patient here for referral. Has had spotting over the last month, tender abdomen, appetite fine, sleep no issues, no problems with bowel and bladder.Patients BP was elevated today she states that she is nervous and it is usually low/.

## 2016-03-17 NOTE — Patient Instructions (Signed)
Progesterone capsules What is this medicine? PROGESTERONE (proe JES ter one) is a female hormone. This medicine is used to prevent the overgrowth of the lining of the uterus in women who are taking estrogens for the symptoms of menopause. It is also used to treat secondary amenorrhea. This is when a woman stops getting menstrual periods due to low levels of progesterone. This medicine may be used for other purposes; ask your health care provider or pharmacist if you have questions. COMMON BRAND NAME(S): Prometrium What should I tell my health care provider before I take this medicine? They need to know if you have any of these conditions: -autoimmune disease like systemic lupus erythematosus (SLE) -blood vessel disease, blood clotting disorder, or suffered a stroke -breast, cervical or vaginal cancer -dementia -diabetes -kidney or liver disease -heart disease, high blood pressure or recent heart attack -high blood lipids or cholesterol -hysterectomy -recent miscarriage -tobacco smoker -vaginal bleeding -an unusual or allergic reaction to progesterone, peanuts, other medicines, foods, dyes, or preservatives -pregnant or trying to get pregnant -breast-feeding How should I use this medicine? Take this medicine by mouth with a glass of water. Follow the directions on the prescription label. Take your doses at regular intervals. Do not take your medicine more often than directed. Talk to your pediatrician regarding the use of this medicine in children. Special care may be needed. A patient package insert for the product will be given with each prescription and refill. Read this sheet carefully each time. The sheet may change frequently. Overdosage: If you think you have taken too much of this medicine contact a poison control center or emergency room at once. NOTE: This medicine is only for you. Do not share this medicine with others. What if I miss a dose? If you miss a dose, take it as soon  as you can. If it is almost time for your next dose, take only that dose. Do not take double or extra doses. What may interact with this medicine? Do not take this medicine with any of the following medications: -bosentan This medicine may also interact with the following medications: -barbiturate medicines for sleep or seizures -bexarotene -carbamazepine -ethotoin -ketoconazole -phenytoin -rifampin This list may not describe all possible interactions. Give your health care provider a list of all the medicines, herbs, non-prescription drugs, or dietary supplements you use. Also tell them if you smoke, drink alcohol, or use illegal drugs. Some items may interact with your medicine. What should I watch for while using this medicine? Visit your doctor or health care professional for regular checks on your progress. This medicine can cause swelling, tenderness, or bleeding of the gums. Be careful when brushing and flossing teeth. See your dentist regularly for routine dental care. You may get drowsy or dizzy. Do not drive, use machinery, or do anything that needs mental alertness until you know how this drug affects you. Do not stand or sit up quickly, especially if you are an older patient. This reduces the risk of dizzy or fainting spells. What side effects may I notice from receiving this medicine? Side effects that you should report to your doctor or health care professional as soon as possible: -allergic reactions like skin rash, itching or hives, swelling of the face, lips, or tongue -breast tissue changes or discharge -changes in vaginal bleeding during your period or between your periods -depression -muscle or bone pain -numbness or pain in the arm or leg -pain in the chest, groin or leg -seizures or tremors -severe  headache -stomach pain -sudden shortness of breath -unusually weak or tired -vision or speech problems -yellowing of skin or eyes Side effects that usually do not  require medical attention (report to your doctor or health care professional if they continue or are bothersome): -acne -fluid retention and swelling -increased in appetite -mood changes, anxiety, depression, frustration, anger, or emotional outbursts -nausea, vomiting -sweating or hot flashes This list may not describe all possible side effects. Call your doctor for medical advice about side effects. You may report side effects to FDA at 1-800-FDA-1088. Where should I keep my medicine? Keep out of the reach of children. Store at room temperature between 15 and 30 degrees C (59 and 86 degrees F). Protect from light. Keep container tightly closed. Throw away any unused medicine after the expiration date. NOTE: This sheet is a summary. It may not cover all possible information. If you have questions about this medicine, talk to your doctor, pharmacist, or health care provider.  2017 Elsevier/Gold Standard (2008-03-07 11:43:48)   Total Laparoscopic Hysterectomy, Care After Refer to this sheet in the next few weeks. These instructions provide you with information on caring for yourself after your procedure. Your health care provider may also give you more specific instructions. Your treatment has been planned according to current medical practices, but problems sometimes occur. Call your health care provider if you have any problems or questions after your procedure. What can I expect after the procedure?  Pain and bruising at the incision sites. You will be given pain medicine to control it.  Menopausal symptoms such as hot flashes, night sweats, and insomnia if your ovaries were removed.  Sore throat from the breathing tube that was inserted during surgery. Follow these instructions at home:  Only take over-the-counter or prescription medicines for pain, discomfort, or fever as directed by your health care provider.  Do not take aspirin. It can cause bleeding.  Do not drive when taking  pain medicine.  Follow your health care provider's advice regarding diet, exercise, lifting, driving, and general activities.  Resume your usual diet as directed and allowed.  Get plenty of rest and sleep.  Do not douche, use tampons, or have sexual intercourse for at least 6 weeks, or until your health care provider gives you permission.  Change your bandages (dressings) as directed by your health care provider.  Monitor your temperature and notify your health care provider of a fever.  Take showers instead of baths for 2-3 weeks.  Do not drink alcohol until your health care provider gives you permission.  If you develop constipation, you may take a mild laxative with your health care provider's permission. Bran foods may help with constipation problems. Drinking enough fluids to keep your urine clear or pale yellow may help as well.  Try to have someone home with you for 1-2 weeks to help around the house.  Keep all of your follow-up appointments as directed by your health care provider. Contact a health care provider if:  You have swelling, redness, or increasing pain around your incision sites.  You have pus coming from your incision.  You notice a bad smell coming from your incision.  Your incision breaks open.  You feel dizzy or lightheaded.  You have pain or bleeding when you urinate.  You have persistent diarrhea.  You have persistent nausea and vomiting.  You have abnormal vaginal discharge.  You have a rash.  You have any type of abnormal reaction or develop an allergy to your  medicine.  You have poor pain control with your prescribed medicine. Get help right away if:  You have chest pain or shortness of breath.  You have severe abdominal pain that is not relieved with pain medicine.  You have pain or swelling in your legs. This information is not intended to replace advice given to you by your health care provider. Make sure you discuss any questions  you have with your health care provider. Document Released: 01/10/2013 Document Revised: 08/28/2015 Document Reviewed: 10/10/2012 Elsevier Interactive Patient Education  2017 [employer].   Stop the Omega-3 Fatty Acids 5 days before surgery    Clear Liquid Diet for GYN Oncology Patients Day Before Surgery The day before your scheduled surgery DO NOT EAT any solid foods.  We do want you to drink enough liquids, but NO MILK products.  We do not want you to be dehydrated.  Clear liquids are defined as no milk products and no pieces of any solid food. The following are all approved for you to drink the day before you surgery. . Chicken, Beef or Vegetable Broth (bouillon or consomm) - NO BROTH AFTER MIDNIGHT . Plain Jello  (no fruit) . Water . Strained lemonade or fruit punch . Gatorade (any flavor) . CLEAR Ensure or Boost Breeze . Fruit juices without pulp, such as apple, grape, or cranberry juice . Clear sodas - NO SODA AFTER MIDNIGHT . Ice Pops without bits of fruit or fruit pulp . Honey . Tea or coffee without milk or cream Any foods not on the above list should be avoided.          DIVISION OF GYNECOLOGIC ONCOLOGY BOWEL PREP  The following instructions are extremely important to prepare for your surgery. Please follow them carefully  Step 1: Liquid Diet Instructions  The day before surgery, drink ONLY CLEAR LIQUIDS for breakfast, lunch, dinner and throughout the day.  Drink at least 64 oz of fluid.  CLEAR LIQUID EXAMPLES:            Beef, chicken or vegetable broth, sodas, coffee, tea (sugar, lemon  artificial sweeteners, honey are acceptable), juices (apple, grape, cranberry, any  mixture of clear juices). Kool-Aid, Gatorade, Jell-o (without fruit), popsicles    NO MILK, MILK PRODUCTS, NON-DAIRY CREAMERS   Step 2: Laxatives   The evening before surgery:   Time: around 5pm  Follow these instructions carefully.  Administer 1 Dulcolax suppository according to  manufacturer instructions on the box. You will need to purchase this laxative at a pharmacy or grocery store.   Individual responses to laxatives vary; this prep may cause multiple bowel movements. It often works in 30 minutes and may take as long as 3 hours. Stay near an available bathroom.   It is important to stay hydrated. Ensure you are still drinking clear liquids.    IMPORTANT: FOR YOUR SAFETY, WE WILL HAVE TO CANCEL YOUR SURGERY IF YOU DO NOT FOLLOW THESE INSTRUCTIONS.   Do not eat anything after midnight (including gum or candy) prior to your surgery.  Avoid drinking carbonated beverages after midnight.  You can have clear liquids up until one hour before you arrive at the hospital. "Nothing by mouth" means no liquids, gum, candy, etc for one hour before your arrival time.              Bowel Symptoms After Surgery After gynecologic surgery, women often have temporary changes in bowel function (constipation and gas pain).  Following are tips to help prevent and treat  common bowel problems.  It also tells you when to call the doctor.  This is important because some symptoms might be a sign of a more serious bowel problem such as obstruction (bowel blockage).  These problems are rare but can happen after gynecologic surgery.  Besides surgery, what can temporarily affect bowel function? 1. Dietary changes   2. Decreased physical activity   3.Antibiotics   4. Pain medication  How can I prevent constipation (three days or more without a stool)? 1. Include fiber in your diet: whole grains, raw or dried fruits & vegetables, prunes, prune/pear juiceDrink at least 8 glasses of liquid (preferably water) every day 2. Avoid: . Gas forming foods such as broccoli, beans, peas, salads, cabbage, sweet potatoes . Greasy, fatty, or fried foods 3. Activity helps bowel function return to normal, walk around the house at least 3-4 times each day for 15 minutes or longer, if tolerated.  Rocking in  a rocking chair is preferable to sitting still. 4. Stool softeners: these are not laxatives, but serve to soften the stool to avoid straining.  Take 2-4 times a day until normal bowel function returns         Examples: Colace or generic equivalent (Docusafe) 5. Bulk laxatives: provide a concentrated source of fiber.  They do not stimulate the bowel.  Take 1-2 times each day until normal bowel function return.              Examples: Citrucel, Metamucil, Fiberal, Fibercon  What can I take for "Gas Pains"? 1. Simethicone (Mylicon, Gas-X, Maalox-Gas, Mylanta-Gas) take 3-4 times a day 2. Maalox Regular - take 3-4 times a day 3. Mylanta Regular - take 3-4 times a day  What can I take if I become constipated? 1. Start with stool softeners and add additional laxatives below as needed to have a bowel movement every 1-2 days  2. Stool softeners 1-2 tablets, 2 times a day 3. Senakot 1-2 tablets, 1-2 times a day 4. Glycerin suppository can soften hard stool take once a day 5. Bisacodyl suppository once a day  6. Milk of Magnesia 30 mL 1-2 times a day 7. Fleets or tap water enema   What can I do for nausea?  1. Limit most solid foods for 24-48 hours 2. Continue eating small frequent amounts of liquids and/or bland soft foods . Toast, crackers, cooked cereal (grits, cream of wheat, rice) 3 Benadryl: a mild anti-nausea medicine can be obtained without a prescription. May cause drowsiness, especially if taken with narcotic pain medicines 4 Contact provider for prescription nausea medication    What can I do, or take for diarrhea (more than five loose stools per day)? 1. Drink plenty of clear fluids to prevent dehydration 2. May take Kaopectate, Pepto-Bismol, Immodium, or probiotics for 1-2 days 3. Annusol or Preparation-H can be helpful for hemorrhoids and irritated tissue around anus  When should I call the doctor?  CONSTIPATION:  . Not relieved after three days following the above  program VOMITING: . That contains blood, "coffee ground" material . More the three times/hour and unable to keep down nausea medication for more than eight hours . With dry mouth, dark or strong urine, feeling light-headed, dizzy, or confused . With severe abdominal pain or bloating for more than 24 hours DIARRHEA: . That continues for more then 24-48 hours despite treatment . That contains blood or tarry material . With dry mouth, dark or strong urine, feeling light~headed, dizzy, or confused FEVER: . 101 F  or higher along with nausea, vomiting, gas pain, diarrhea UNABLE TO: . Pass gas from rectum for more than 24 hours . Tolerate liquids by mouth for more than 24 hours           Laparoscopy Laparoscopy is a procedure to diagnose diseases in the abdomen. During the procedure, a thin, lighted, pencil-sized instrument called a laparoscope is inserted into the abdomen through an incision. The laparoscope allows your health care provider to look at the organs inside your body. LET [facility] CARE PROVIDER KNOW ABOUT:  Any allergies you have.  All medicines you are taking, including vitamins, herbs, eye drops, creams, and over-the-counter medicines.  Previous problems you or members of your family have had with the use of anesthetics.  Any blood disorders you have.  Previous surgeries you have had.  Medical conditions you have. RISKS AND COMPLICATIONS  Generally, this is a safe procedure. However, problems can occur, which may include:  Infection.  Bleeding.  Damage to other organs.  Allergic reaction to the anesthetics used during the procedure. BEFORE THE PROCEDURE  Do not eat or drink anything after midnight on the night before the procedure or as directed by your health care provider.  Ask your health care provider about:  Changing or stopping your regular medicines.  Taking medicines such as aspirin and ibuprofen. These medicines can thin your blood. Do not take  these medicines before your procedure if your health care provider instructs you not to.  Plan to have someone take you home after the procedure. PROCEDURE  You may be given a medicine to help you relax (sedative).  You will be given a medicine to make you sleep (general anesthetic).  Your abdomen will be inflated with a gas. This will make your organs easier to see.  Small incisions will be made in your abdomen.  A laparoscope and other small instruments will be inserted into the abdomen through the incisions.  A tissue sample may be removed from an organ in the abdomen for examination.  The instruments will be removed from the abdomen.  The gas will be released.  The incisions will be closed with stitches (sutures). AFTER THE PROCEDURE  Your blood pressure, heart rate, breathing rate, and blood oxygen level will be monitored often until the medicines you were given have worn off.   This information is not intended to replace advice given to you by your health care provider. Make sure you discuss any questions you have with your health care provider.   c.   Laparoscopy, Care After Refer to this sheet in the next few weeks. These instructions provide you with information about caring for yourself after your procedure. Your health care provider may also give you more specific instructions. Your treatment has been planned according to current medical practices, but problems sometimes occur. Call your health care provider if you have any problems or questions after your procedure. WHAT TO EXPECT AFTER THE PROCEDURE After your procedure, it is common to have mild discomfort in the throat and abdomen. HOME CARE INSTRUCTIONS  Take over-the-counter and prescription medicines only as told by your health care provider.  Do not drive for 24 hours if you received a sedative.  Return to your normal activities as told by your health care provider.  Do not take baths, swim, or use a hot  tub until your health care provider approves. You may shower.  Follow instructions from your health care provider about how to take care of your  incision. Make sure you:   Wash your hands with soap and water before you change your bandage (dressing). If soap and water are not available, use hand sanitizer.   Change your dressing as told by your health care provider.   Leave stitches (sutures), skin glue, or adhesive strips in place. These skin closures may need to stay in place for 2 weeks or longer. If adhesive strip edges start to loosen and curl up, you may trim the loose edges. Do not remove adhesive strips completely unless your health care provider tells you to do that.  Check your incision area every day for signs of infection. Check for:   More redness, swelling, or pain.   More fluid or blood.   Warmth.   Pus or a bad smell.  It is your responsibility to get the results of your procedure. Ask your health care provider or the department performing the procedure when your results will be ready. SEEK MEDICAL CARE IF:  There is new pain in your shoulders.  You feel light-headed or faint.  You are unable to pass gas or unable to have a bowel movement.  You feel nauseous or you vomit.  You develop a rash.  You have more redness, swelling, or pain around your incision.  You have more fluid or blood coming from your incision.  Your incision feels warm to the touch.  You have pus or a bad smell coming from your incision.  You have a fever or chills. SEEK IMMEDIATE MEDICAL CARE IF:  Your pain is getting worse.  You have ongoing vomiting.  The edges of your incision open up.  You have trouble breathing.  You have chest pain.   This information is not intended to replace advice given to you by your health care provider. Make sure you discuss any questions you have with your health care provider.

## 2016-03-18 ENCOUNTER — Telehealth: Payer: Self-pay

## 2016-03-18 DIAGNOSIS — N8502 Endometrial intraepithelial neoplasia [EIN]: Secondary | ICD-10-CM

## 2016-03-18 LAB — FOLLICLE STIMULATING HORMONE: FSH: 65.5 m[IU]/mL

## 2016-03-18 MED ORDER — MEGESTROL ACETATE 40 MG PO TABS: 40.0000 mg | ORAL_TABLET | Freq: Every day | ORAL | 5 refills | Status: DC

## 2016-03-18 NOTE — Telephone Encounter (Signed)
  Oncology Nurse Navigator Documentation Notified that Megace 40mg  daily has been sent to her pharmacy. Surgery has been scheduled for 04/21/16 Navigator Location: [facility] (03/18/16 1400)   )Navigator Encounter Type: Telephone (03/18/16 1400) Telephone: Outgoing Call (03/18/16 1400)     Surgery Date: 04/21/16 (03/18/16 1400)                                            Time Spent with Patient: 15 (03/18/16 1400)

## 2016-04-01 ENCOUNTER — Telehealth: Payer: Self-pay | Admitting: Obstetrics and Gynecology

## 2016-04-01 NOTE — Telephone Encounter (Signed)
Ms. Bauknecht was contacted about her [facility] level. This level is consistent with menopause and I have recommended removal of her ovaries at the time of surgery. She agrees with this plan.    Ref Range & Units 2wk ago  FSH mIU/mL 65.5   Comments: (NOTE)    Gillis Ends, MD

## 2016-04-01 NOTE — Telephone Encounter (Signed)
See documentation about [facility]

## 2016-04-14 ENCOUNTER — Encounter
Admission: RE | Admit: 2016-04-14 | Discharge: 2016-04-14 | Disposition: A | Discharge status: Home / Self Care | Payer: 59 | Source: Ambulatory Visit | Attending: Obstetrics and Gynecology | Admitting: Obstetrics and Gynecology

## 2016-04-14 DIAGNOSIS — Z01812 Encounter for preprocedural laboratory examination: Secondary | ICD-10-CM | POA: Insufficient documentation

## 2016-04-14 HISTORY — DX: Anemia, unspecified: D64.9

## 2016-04-14 HISTORY — DX: Personal history of urinary calculi: Z87.442

## 2016-04-14 LAB — CBC
HCT: 40 % (ref 35.0–47.0)
Hemoglobin: 13.4 g/dL (ref 12.0–16.0)
MCH: 28.3 pg (ref 26.0–34.0)
MCHC: 33.5 g/dL (ref 32.0–36.0)
MCV: 84.4 fL (ref 80.0–100.0)
PLATELETS: 251 10*3/uL (ref 150–440)
RBC: 4.74 MIL/uL (ref 3.80–5.20)
RDW: 15.2 % — ABNORMAL HIGH (ref 11.5–14.5)
WBC: 5.9 10*3/uL (ref 3.6–11.0)

## 2016-04-14 LAB — TYPE AND SCREEN
ABO/RH(D): O POS
Antibody Screen: NEGATIVE

## 2016-04-14 LAB — DIFFERENTIAL
BASOS ABS: 0 10*3/uL (ref 0–0.1)
BASOS PCT: 1 %
EOS ABS: 0.1 10*3/uL (ref 0–0.7)
Eosinophils Relative: 1 %
LYMPHS ABS: 2.1 10*3/uL (ref 1.0–3.6)
Lymphocytes Relative: 36 %
MONO ABS: 0.3 10*3/uL (ref 0.2–0.9)
MONOS PCT: 6 %
NEUTROS PCT: 56 %
Neutro Abs: 3.4 10*3/uL (ref 1.4–6.5)

## 2016-04-14 NOTE — Patient Instructions (Signed)
  Your procedure is scheduled on: April 21, 2016 (Wednesday) Report to Same Day Surgery 2nd floor medical mall [facility] Entrance-take elevator on left to 2nd floor.  Check in with surgery information desk.) To find out your arrival time please call [phone] between 1PM - 3PM on April 20, 2016 (Tuesday)  Remember: Instructions that are not followed completely may result in serious medical risk, up to and including death, or upon the discretion of your surgeon and anesthesiologist your surgery may need to be rescheduled.    _x___ 1. Do not eat food or drink liquids after midnight. No gum chewing or hard candies.     __x__ 2. No Alcohol for 24 hours before or after surgery.   __x__3. No Smoking for 24 prior to surgery.   ____  4. Bring all medications with you on the day of surgery if instructed.    __x__ 5. Notify your doctor if there is any change in your medical condition     (cold, fever, infections).     Do not wear jewelry, make-up, hairpins, clips or nail polish.  Do not wear lotions, powders, or perfumes. You may wear deodorant.  Do not shave 48 hours prior to surgery. Men may shave face and neck.  Do not bring valuables to the hospital.    [facility] is not responsible for any belongings or valuables.               Contacts, dentures or bridgework may not be worn into surgery.  Leave your suitcase in the car. After surgery it may be brought to your room.  For patients admitted to the hospital, discharge time is determined by your treatment team.   Patients discharged the day of surgery will not be allowed to drive home.  You will need someone to drive you home and stay with you the night of your procedure.    Please read over the following fact sheets that you were given:   [facility] Preparing for Surgery and or MRSA Information   _x___ Take these medicines the morning of surgery with A SIP OF WATER:    1. Synthroid  2.  3.  4.  5.  6.  ____Fleets  enema or Magnesium Citrate as directed.   _x___ Use CHG Soap or sage wipes as directed on instruction sheet   ____ Use inhalers on the day of surgery and bring to hospital day of surgery  ____ Stop metformin 2 days prior to surgery    ____ Take 1/2 of usual insulin dose the night before surgery and none on the morning of           surgery.   _x___ Stop Aspirin, Coumadin, Pllavix ,Eliquis, Effient, or Pradaxa (NO ASPIRIN)  x__ Stop Anti-inflammatories such as Advil, Aleve, Ibuprofen, Motrin, Naproxen,          Naprosyn, Goodies powders or aspirin products. Ok to take Tylenol.   _x__ Stop supplements until after surgery.  (STOP VITAMIN C AND FISH OIL NOW)  ____ Bring C-Pap to the hospital.

## 2016-04-20 ENCOUNTER — Telehealth: Payer: Self-pay

## 2016-04-20 NOTE — Telephone Encounter (Signed)
  Oncology Nurse Navigator Documentation Verified with Ann Robinson that she would arrive for her surgery 1/17. Navigator Location: [facility] (04/20/16 1600)   )Navigator Encounter Type: Telephone (04/20/16 1600) Telephone: Millen Call (04/20/16 1600)                                                  Time Spent with Patient: 15 (04/20/16 1600)

## 2016-04-21 ENCOUNTER — Ambulatory Visit: Payer: 59 | Admitting: Anesthesiology

## 2016-04-21 ENCOUNTER — Observation Stay
Admission: RE | Admit: 2016-04-21 | Discharge: 2016-04-22 | Disposition: A | Discharge status: Home / Self Care | Payer: 59 | Source: Ambulatory Visit | Attending: Obstetrics and Gynecology | Admitting: Obstetrics and Gynecology

## 2016-04-21 ENCOUNTER — Encounter: Payer: Self-pay | Admitting: Anesthesiology

## 2016-04-21 ENCOUNTER — Encounter
Admission: RE | Disposition: A | Discharge status: Home / Self Care | Payer: Self-pay | Source: Ambulatory Visit | Attending: Obstetrics and Gynecology

## 2016-04-21 DIAGNOSIS — N950 Postmenopausal bleeding: Secondary | ICD-10-CM | POA: Diagnosis present

## 2016-04-21 DIAGNOSIS — N840 Polyp of corpus uteri: Secondary | ICD-10-CM | POA: Diagnosis not present

## 2016-04-21 DIAGNOSIS — Z6841 Body Mass Index (BMI) 40.0 and over, adult: Secondary | ICD-10-CM | POA: Insufficient documentation

## 2016-04-21 DIAGNOSIS — Z90710 Acquired absence of both cervix and uterus: Secondary | ICD-10-CM

## 2016-04-21 DIAGNOSIS — N8502 Endometrial intraepithelial neoplasia [EIN]: Secondary | ICD-10-CM | POA: Diagnosis not present

## 2016-04-21 DIAGNOSIS — D649 Anemia, unspecified: Secondary | ICD-10-CM | POA: Diagnosis not present

## 2016-04-21 HISTORY — PX: CYSTOSCOPY: SHX5120

## 2016-04-21 HISTORY — PX: ROBOTIC ASSISTED TOTAL HYSTERECTOMY WITH BILATERAL SALPINGO OOPHERECTOMY: SHX6086

## 2016-04-21 HISTORY — PX: SENTINEL NODE BIOPSY: SHX6608

## 2016-04-21 LAB — ABO/RH: ABO/RH(D): O POS

## 2016-04-21 SURGERY — ROBOTIC ASSISTED TOTAL HYSTERECTOMY WITH BILATERAL SALPINGO OOPHORECTOMY
Anesthesia: General

## 2016-04-21 MED ORDER — LIDOCAINE HCL (CARDIAC) 20 MG/ML IV SOLN
INTRAVENOUS | Status: DC | PRN
  Administered 2016-04-21: 60 mg via INTRAVENOUS

## 2016-04-21 MED ORDER — FENTANYL CITRATE (PF) 100 MCG/2ML IJ SOLN
INTRAMUSCULAR | Status: AC
  Administered 2016-04-21: 25 ug via INTRAVENOUS
  Filled 2016-04-21: qty 2

## 2016-04-21 MED ORDER — HYDROMORPHONE HCL 1 MG/ML IJ SOLN
INTRAMUSCULAR | Status: AC
  Administered 2016-04-21: 0.5 mg via INTRAVENOUS
  Filled 2016-04-21: qty 1

## 2016-04-21 MED ORDER — GLYCOPYRROLATE 0.2 MG/ML IJ SOLN
INTRAMUSCULAR | Status: DC | PRN
  Administered 2016-04-21: 0.2 mg via INTRAVENOUS

## 2016-04-21 MED ORDER — HEMOSTATIC AGENTS (NO CHARGE) OPTIME
TOPICAL | Status: DC | PRN
  Administered 2016-04-21: 1 via TOPICAL

## 2016-04-21 MED ORDER — FAMOTIDINE 20 MG PO TABS
20.0000 mg | ORAL_TABLET | Freq: Once | ORAL | Status: AC
  Administered 2016-04-21: 20 mg via ORAL

## 2016-04-21 MED ORDER — BUPIVACAINE HCL (PF) 0.5 % IJ SOLN
INTRAMUSCULAR | Status: DC | PRN
  Administered 2016-04-21: 25 mL

## 2016-04-21 MED ORDER — MIDAZOLAM HCL 2 MG/2ML IJ SOLN
INTRAMUSCULAR | Status: DC | PRN
  Administered 2016-04-21: 2 mg via INTRAVENOUS

## 2016-04-21 MED ORDER — ONDANSETRON HCL 4 MG/2ML IJ SOLN
INTRAMUSCULAR | Status: AC
  Filled 2016-04-21: qty 2

## 2016-04-21 MED ORDER — LACTATED RINGERS IV SOLN
INTRAVENOUS | Status: DC
  Administered 2016-04-21: 11:00:00 via INTRAVENOUS

## 2016-04-21 MED ORDER — PHENYLEPHRINE 40 MCG/ML (10ML) SYRINGE FOR IV PUSH (FOR BLOOD PRESSURE SUPPORT)
PREFILLED_SYRINGE | INTRAVENOUS | Status: AC
  Filled 2016-04-21: qty 10

## 2016-04-21 MED ORDER — LIDOCAINE HCL (CARDIAC) 20 MG/ML IV SOLN: INTRAVENOUS | Status: DC | PRN

## 2016-04-21 MED ORDER — FENTANYL CITRATE (PF) 100 MCG/2ML IJ SOLN
INTRAMUSCULAR | Status: AC
  Filled 2016-04-21: qty 2

## 2016-04-21 MED ORDER — OXYCODONE-ACETAMINOPHEN 5-325 MG PO TABS
1.0000 | ORAL_TABLET | ORAL | Status: DC | PRN
  Administered 2016-04-22 (×2): 2 via ORAL
  Administered 2016-04-22: 1 via ORAL
  Filled 2016-04-21: qty 2
  Filled 2016-04-21: qty 1
  Filled 2016-04-21: qty 2

## 2016-04-21 MED ORDER — ONDANSETRON HCL 4 MG/2ML IJ SOLN
INTRAMUSCULAR | Status: DC | PRN
  Administered 2016-04-21: 4 mg via INTRAVENOUS

## 2016-04-21 MED ORDER — MENTHOL 3 MG MT LOZG
1.0000 | LOZENGE | OROMUCOSAL | Status: DC | PRN
  Administered 2016-04-21 – 2016-04-22 (×3): 3 mg via ORAL
  Filled 2016-04-21: qty 9

## 2016-04-21 MED ORDER — HYDROMORPHONE HCL 1 MG/ML IJ SOLN
INTRAMUSCULAR | Status: DC | PRN
  Administered 2016-04-21 (×2): .25 mg via INTRAVENOUS
  Administered 2016-04-21: 0.5 mg via INTRAVENOUS

## 2016-04-21 MED ORDER — ONDANSETRON HCL 4 MG PO TABS: 4.0000 mg | ORAL_TABLET | Freq: Four times a day (QID) | ORAL | Status: DC | PRN

## 2016-04-21 MED ORDER — SUGAMMADEX SODIUM 200 MG/2ML IV SOLN
INTRAVENOUS | Status: AC
Start: 2016-04-21 — End: 2016-04-21
  Filled 2016-04-21: qty 2

## 2016-04-21 MED ORDER — SUGAMMADEX SODIUM 200 MG/2ML IV SOLN
INTRAVENOUS | Status: DC | PRN
  Administered 2016-04-21: 300 mg via INTRAVENOUS

## 2016-04-21 MED ORDER — PROPOFOL 10 MG/ML IV BOLUS
INTRAVENOUS | Status: DC | PRN
  Administered 2016-04-21: 120 mg via INTRAVENOUS

## 2016-04-21 MED ORDER — KETOROLAC TROMETHAMINE 30 MG/ML IJ SOLN
INTRAMUSCULAR | Status: AC
  Filled 2016-04-21: qty 1

## 2016-04-21 MED ORDER — DEXAMETHASONE SODIUM PHOSPHATE 10 MG/ML IJ SOLN
INTRAMUSCULAR | Status: DC | PRN
  Administered 2016-04-21: 10 mg via INTRAVENOUS

## 2016-04-21 MED ORDER — PHENYLEPHRINE HCL 10 MG/ML IJ SOLN
INTRAMUSCULAR | Status: DC | PRN
  Administered 2016-04-21: 80 ug via INTRAVENOUS

## 2016-04-21 MED ORDER — SODIUM CHLORIDE 0.9 % IV SOLN
INTRAVENOUS | Status: DC
  Administered 2016-04-21 – 2016-04-22 (×2): via INTRAVENOUS

## 2016-04-21 MED ORDER — ACETAMINOPHEN 10 MG/ML IV SOLN
INTRAVENOUS | Status: AC
  Filled 2016-04-21: qty 100

## 2016-04-21 MED ORDER — HYDROMORPHONE HCL 1 MG/ML IJ SOLN
INTRAMUSCULAR | Status: AC
  Filled 2016-04-21: qty 1

## 2016-04-21 MED ORDER — ACETAMINOPHEN 10 MG/ML IV SOLN
INTRAVENOUS | Status: DC | PRN
  Administered 2016-04-21: 1000 mg via INTRAVENOUS

## 2016-04-21 MED ORDER — PROPOFOL 10 MG/ML IV BOLUS
INTRAVENOUS | Status: AC
  Filled 2016-04-21: qty 40

## 2016-04-21 MED ORDER — KETOROLAC TROMETHAMINE 30 MG/ML IJ SOLN: 30.0000 mg | Freq: Four times a day (QID) | INTRAMUSCULAR | Status: DC

## 2016-04-21 MED ORDER — INDOCYANINE GREEN 25 MG IV SOLR
INTRAVENOUS | Status: DC | PRN
  Administered 2016-04-21: 25 mg via TOPICAL

## 2016-04-21 MED ORDER — DEXAMETHASONE SODIUM PHOSPHATE 10 MG/ML IJ SOLN
INTRAMUSCULAR | Status: AC
  Filled 2016-04-21: qty 1

## 2016-04-21 MED ORDER — KETOROLAC TROMETHAMINE 30 MG/ML IJ SOLN
30.0000 mg | Freq: Four times a day (QID) | INTRAMUSCULAR | Status: DC
  Administered 2016-04-21 – 2016-04-22 (×2): 30 mg via INTRAVENOUS
  Filled 2016-04-21 (×2): qty 1

## 2016-04-21 MED ORDER — HYDROMORPHONE HCL 1 MG/ML IJ SOLN
0.5000 mg | INTRAMUSCULAR | Status: AC | PRN
  Administered 2016-04-21 (×4): 0.5 mg via INTRAVENOUS

## 2016-04-21 MED ORDER — FENTANYL CITRATE (PF) 100 MCG/2ML IJ SOLN
INTRAMUSCULAR | Status: DC | PRN
Start: 2016-04-21 — End: 2016-04-21
  Administered 2016-04-21 (×2): 50 ug via INTRAVENOUS

## 2016-04-21 MED ORDER — INDOCYANINE GREEN 25 MG IV SOLR
INTRAVENOUS | Status: AC
  Filled 2016-04-21: qty 25

## 2016-04-21 MED ORDER — ROCURONIUM BROMIDE 100 MG/10ML IV SOLN
INTRAVENOUS | Status: DC | PRN
  Administered 2016-04-21: 20 mg via INTRAVENOUS
  Administered 2016-04-21: 50 mg via INTRAVENOUS
  Administered 2016-04-21: 10 mg via INTRAVENOUS

## 2016-04-21 MED ORDER — MIDAZOLAM HCL 2 MG/2ML IJ SOLN
INTRAMUSCULAR | Status: AC
  Filled 2016-04-21: qty 2

## 2016-04-21 MED ORDER — SIMETHICONE 80 MG PO CHEW: 80.0000 mg | CHEWABLE_TABLET | Freq: Four times a day (QID) | ORAL | Status: DC | PRN

## 2016-04-21 MED ORDER — SUGAMMADEX SODIUM 200 MG/2ML IV SOLN
INTRAVENOUS | Status: AC
  Filled 2016-04-21: qty 2

## 2016-04-21 MED ORDER — KETOROLAC TROMETHAMINE 30 MG/ML IJ SOLN
INTRAMUSCULAR | Status: DC | PRN
  Administered 2016-04-21: 30 mg via INTRAVENOUS

## 2016-04-21 MED ORDER — LACTATED RINGERS IV SOLN
INTRAVENOUS | Status: DC | PRN
  Administered 2016-04-21: 13:00:00 via INTRAVENOUS

## 2016-04-21 MED ORDER — BUPIVACAINE HCL (PF) 0.5 % IJ SOLN
INTRAMUSCULAR | Status: AC
  Filled 2016-04-21: qty 30

## 2016-04-21 MED ORDER — ROCURONIUM BROMIDE 50 MG/5ML IV SOSY
PREFILLED_SYRINGE | INTRAVENOUS | Status: AC
  Filled 2016-04-21: qty 5

## 2016-04-21 MED ORDER — ONDANSETRON HCL 4 MG/2ML IJ SOLN: 4.0000 mg | Freq: Once | INTRAMUSCULAR | Status: DC | PRN

## 2016-04-21 MED ORDER — FENTANYL CITRATE (PF) 100 MCG/2ML IJ SOLN
25.0000 ug | INTRAMUSCULAR | Status: DC | PRN
  Administered 2016-04-21 (×4): 25 ug via INTRAVENOUS

## 2016-04-21 MED ORDER — DEXTROSE 5 % IV SOLN
3.0000 g | INTRAVENOUS | Status: AC
  Administered 2016-04-21: 3 g via INTRAVENOUS
  Filled 2016-04-21: qty 3000

## 2016-04-21 MED ORDER — FAMOTIDINE 20 MG PO TABS
ORAL_TABLET | ORAL | Status: AC
  Administered 2016-04-21: 20 mg via ORAL
  Filled 2016-04-21: qty 1

## 2016-04-21 MED ORDER — ONDANSETRON HCL 4 MG/2ML IJ SOLN: 4.0000 mg | Freq: Four times a day (QID) | INTRAMUSCULAR | Status: DC | PRN

## 2016-04-21 MED ORDER — LEVOTHYROXINE SODIUM 75 MCG PO TABS
125.0000 ug | ORAL_TABLET | Freq: Every day | ORAL | Status: DC
  Administered 2016-04-22: 125 ug via ORAL
  Filled 2016-04-21: qty 1

## 2016-04-21 SURGICAL SUPPLY — 73 items
BAG URO DRAIN 2000ML W/SPOUT (MISCELLANEOUS) ×3 IMPLANT
BLADE SURG SZ11 CARB STEEL (BLADE) ×3 IMPLANT
CANNULA SEALS 8.5MM (CANNULA) ×1
CATH FOLEY 2WAY  5CC 16FR (CATHETERS) ×1
CATH ROBINSON RED A/P 16FR (CATHETERS) ×3 IMPLANT
CATH URTH 16FR FL 2W BLN LF (CATHETERS) ×2 IMPLANT
CORD BIP STRL DISP 12FT (MISCELLANEOUS) ×3 IMPLANT
CORD MONOPOLAR M/FML 12FT (MISCELLANEOUS) ×3 IMPLANT
COVER LIGHT HANDLE STERIS (MISCELLANEOUS) ×3 IMPLANT
COVER TIP SHEARS 8 DVNC (MISCELLANEOUS) ×2 IMPLANT
COVER TIP SHEARS 8MM DA VINCI (MISCELLANEOUS) ×1
CRADLE LAMINECT ARM (MISCELLANEOUS) ×3 IMPLANT
DEFOGGER SCOPE WARMER CLEARIFY (MISCELLANEOUS) ×3 IMPLANT
DERMABOND ADVANCED (GAUZE/BANDAGES/DRESSINGS) ×1
DERMABOND ADVANCED .7 DNX12 (GAUZE/BANDAGES/DRESSINGS) ×2 IMPLANT
DRAPE 3 ARM ACCESS DA VINCI (DRAPES) ×1
DRAPE 3 ARM ACCESS DVNC (DRAPES) ×2 IMPLANT
DRAPE SHEET LG 3/4 BI-LAMINATE (DRAPES) ×3 IMPLANT
DRAPE UNDER BUTTOCK W/FLU (DRAPES) ×3 IMPLANT
DRESSING SURGICEL FIBRLLR 1X2 (HEMOSTASIS) ×2 IMPLANT
DRSG SURGICEL FIBRILLAR 1X2 (HEMOSTASIS) ×3
ELECT REM PT RETURN 9FT ADLT (ELECTROSURGICAL) ×3
ELECTRODE REM PT RTRN 9FT ADLT (ELECTROSURGICAL) ×2 IMPLANT
FILTER LAP SMOKE EVAC STRL (MISCELLANEOUS) ×3 IMPLANT
GLOVE BIO SURGEON STRL SZ7 (GLOVE) ×12 IMPLANT
GLOVE INDICATOR 7.5 STRL GRN (GLOVE) ×12 IMPLANT
GOWN STRL REUS W/ TWL LRG LVL3 (GOWN DISPOSABLE) ×12 IMPLANT
GOWN STRL REUS W/TWL LRG LVL3 (GOWN DISPOSABLE) ×6
GRASPER SUT TROCAR 14GX15 (MISCELLANEOUS) ×6 IMPLANT
IRRIGATION STRYKERFLOW (MISCELLANEOUS) ×4 IMPLANT
IRRIGATOR STRYKERFLOW (MISCELLANEOUS) ×6
IV NS 1000ML (IV SOLUTION) ×1
IV NS 1000ML BAXH (IV SOLUTION) ×2 IMPLANT
KIT PINK PAD W/HEAD ARE REST (MISCELLANEOUS) ×3
KIT PINK PAD W/HEAD ARM REST (MISCELLANEOUS) ×2 IMPLANT
KIT RM TURNOVER CYSTO AR (KITS) ×3 IMPLANT
LABEL OR SOLS (LABEL) ×3 IMPLANT
LIQUID BAND (GAUZE/BANDAGES/DRESSINGS) ×3 IMPLANT
MANIPULATOR VCARE LG CRV RETR (MISCELLANEOUS) ×3 IMPLANT
MANIPULATOR VCARE SML CRV RETR (MISCELLANEOUS) IMPLANT
MANIPULATOR VCARE STD CRV RETR (MISCELLANEOUS) IMPLANT
NDL INSUFF 14G 150MM VS150000 (NEEDLE) ×3 IMPLANT
NEEDLE VERESS 14GA 120MM (NEEDLE) ×3 IMPLANT
NS IRRIG 1000ML POUR BTL (IV SOLUTION) ×3 IMPLANT
OCCLUDER COLPOPNEUMO (BALLOONS) ×3 IMPLANT
PACK GYN LAPAROSCOPIC (MISCELLANEOUS) ×3 IMPLANT
PAD OB MATERNITY 4.3X12.25 (PERSONAL CARE ITEMS) ×3 IMPLANT
PAD PREP 24X41 OB/GYN DISP (PERSONAL CARE ITEMS) ×3 IMPLANT
POUCH ENDO CATCH 10MM SPEC (MISCELLANEOUS) ×3 IMPLANT
SCISSORS METZENBAUM CVD 33 (INSTRUMENTS) ×3 IMPLANT
SEAL CANN 8.5 DVNC (CANNULA) ×2 IMPLANT
SET CYSTO W/LG BORE CLAMP LF (SET/KITS/TRAYS/PACK) ×3 IMPLANT
SOLUTION ELECTROLUBE (MISCELLANEOUS) ×3 IMPLANT
SPONGE XRAY 4X4 16PLY STRL (MISCELLANEOUS) ×6 IMPLANT
SUCTION IRRIG MULTIPORT DISP (INSTRUMENTS) ×3 IMPLANT
SURGILUBE 2OZ TUBE FLIPTOP (MISCELLANEOUS) ×3 IMPLANT
SUT DVC VLOC 180 0 12IN GS21 (SUTURE) ×3
SUT ETHIBOND NAB CT1 #1 30IN (SUTURE) ×3 IMPLANT
SUT MNCRL 4-0 (SUTURE) ×3
SUT MNCRL 4-0 27XMFL (SUTURE) ×6
SUT VIC AB 0 CT2 27 (SUTURE) ×3 IMPLANT
SUT VIC AB 2-0 CT1 27 (SUTURE) ×1
SUT VIC AB 2-0 CT1 TAPERPNT 27 (SUTURE) ×2 IMPLANT
SUT VICRYL 0 AB UR-6 (SUTURE) ×12 IMPLANT
SUTURE DVC VLC 180 0 12IN GS21 (SUTURE) ×2 IMPLANT
SUTURE MNCRL 4-0 27XMF (SUTURE) ×6 IMPLANT
SYR 50ML LL SCALE MARK (SYRINGE) ×3 IMPLANT
SYRINGE 10CC LL (SYRINGE) ×3 IMPLANT
TROCAR DISP BLADELESS 8 DVNC (TROCAR) ×2 IMPLANT
TROCAR DISP BLADELESS 8MM (TROCAR) ×1
TROCAR VERSASTEP 12M LG PL (TROCAR) ×3 IMPLANT
TROCAR XCEL 12X100 BLDLESS (ENDOMECHANICALS) ×3 IMPLANT
TUBING INSUFFLATOR HEATED (MISCELLANEOUS) ×3 IMPLANT

## 2016-04-21 NOTE — Anesthesia Post-op Follow-up Note (Cosign Needed)
Anesthesia QCDR form completed.        

## 2016-04-21 NOTE — H&P (Signed)
Progress Notes Encounter Date: 03/17/2016 2:30 PM patient re-evaluated 04/21/2016 [place] Gaetana Michaelis, MD  GYN Oncology    [] Hide copied text [] Hover for attribution information Gynecologic Oncology Consult Visit   Referring Provider: Dr. Malachy Mood, MD [facility]  Chief Concern: complex hyperplasia with atypia.  Subjective:  Ann Robinson is a 56 y.o. female who is seen in consultation from Dr. Georgianne Fick for complex hyperplasia with atypia.  She presented with postmenopausal vaginal bleeding. Evaluation included pelvic ultrasound on that was abnormal with thickened endometrial stripe. Uterus 7x4x5 cm. EMBx 9.65 mm. Left ovary normal size. Right ovary not visualized.   EMBx 03/02/2016: focal complex atypical hyperplasia probably arising in an endometrial polyp  She presents today to review management options.    Problem List:     Patient Active Problem List   Diagnosis Date Noted  . Complex atypical endometrial hyperplasia 03/17/2016  . Polyp of colon 11/18/2014  . Hypothyroidism 11/18/2014  . Goiter 11/18/2014  . Headache 11/18/2014  . Migraine 11/18/2014  . Obesity 11/18/2014    Past Medical History:     Past Medical History:  Diagnosis Date  . Goiter   . Migraines   . Nephrolithiasis 1988  . Thyroid disease     Past Surgical History:      Past Surgical History:  Procedure Laterality Date  . CHOLECYSTECTOMY      Past Gynecologic History:  Menarche: 11 Last pap: 11/16/11 Pap negative   Health care screening: Colonoscopy nomral 01/2009, 2015  OB History:                  OB History  Gravida Para Term Preterm AB Living  2 2       2   SAB TAB Ectopic Multiple Live Births               # Outcome Date GA Lbr Len/2nd Weight Sex Delivery Anes PTL Lv  2 Para           1 Para               Family History:       Family History  Problem Relation Age of Onset  . Cancer Mother      colon and breast  . Diabetes Mother   . Alzheimer's disease Father   . Heart disease Father   . Hypertension Father   . Diabetes Father   . Diabetes Sister   . Hypertension Sister   . Diabetes Brother   . Liver disease Brother   . Heart disease Maternal Grandfather     MI  . Stroke Paternal Grandmother   . Thyroid disease Sister   . Diabetes Brother     Social History: Social History        Social History  . Marital status: Widowed    Spouse name: N/A  . Number of children: N/A  . Years of education: N/A      Occupational History  . Not on file.         Social History Main Topics  . Smoking status: Never Smoker  . Smokeless tobacco: Never Used  . Alcohol use 0.0 oz/week     Comment: occasional  . Drug use: No  . Sexual activity: Yes       Other Topics Concern  . Not on file      Social History Narrative  . No narrative on file    Allergies:     Allergies  Allergen Reactions  .  Sulfa Antibiotics Rash    Current Medications:       Current Outpatient Prescriptions  Medication Sig Dispense Refill  . cholecalciferol (VITAMIN D) 1000 UNITS tablet Take 1,000 Units by mouth 2 (two) times daily.    Marland Kitchen levothyroxine (SYNTHROID, LEVOTHROID) 125 MCG tablet Take 125 mcg by mouth daily.    . Multiple Vitamin (MULTI-VITAMINS) TABS Take 1 tablet by mouth daily.     . Omega-3 Fatty Acids (FISH OIL PO) Take by mouth daily.     No current facility-administered medications for this visit.     Review of Systems General: no complaints  HEENT: no complaints  Lungs: no complaints  Cardiac: no complaints  GI: no complaints  GU: vaginal bleeding/spotting  Musculoskeletal: no complaints  Extremities: no complaints  Skin: no complaints  Neuro: no complaints  Endocrine: no complaints  Psych: no complaints       Objective:  Physical Examination:  BP (!) 143/95 (BP Location: Left Wrist, Patient Position: Sitting)    Pulse 63   Temp 97.6 F (36.4 C) (Tympanic)   Ht 5\' 8"  (1.727 m)   Wt 299 lb 13.2 oz (136 kg)   LMP 09/17/2013 (Approximate)   BMI 45.59 kg/m    ECOG Performance Status: 0 - Asymptomatic  General appearance: alert, cooperative and appears stated age HEENT:PERRLA, extra ocular movement intact and sclera clear, anicteric Lymph node survey: non-palpable, axillary, inguinal, supraclavicular Cardiovascular: regular rate and rhythm Respiratory: normal air entry, lungs clear to auscultation Abdomen: soft, non-tender, without masses or organomegaly, protuberant, no hernias and well healed incision Back: inspection of back is normal Extremities: extremities normal, atraumatic, no cyanosis or edema Skin exam - normal coloration and turgor, no rashes, no suspicious skin lesions noted. Neurological exam reveals alert, oriented, normal speech, no focal findings or movement disorder noted.  Pelvic: exam chaperoned by nurse;  Vulva: normal appearing vulva with no masses, tenderness or lesions; Vagina: normal vagina; Adnexa: no masses, but limited by exam; Uterus: uterus is normal size, and nontender; Cervix: no lesions; Rectal: not indicated   Lab Review Labs on site today: [facility] ordered    Assessment:  Ann Robinson is a 56 y.o. female diagnosed with complex hyperplasia with atypia.    Medical co-morbidities complicating care: Body mass index is 45.59 kg/m.  Plan:   Problem List Items Addressed This Visit          Genitourinary   Complex atypical endometrial hyperplasia - Primary   Relevant Orders   Follicle stimulating hormone      We discussed options for management including surgery versus hormonal therapy. Based on completed fertility and desire for definitive therapy, I recommended definitive surgical evaluation with robotic assisted laparoscopic hysterectomy. She would like to preserve her ovaries as possible, she does not feel that she has completed  menopause and would like to preserve hormonal function. Therefore if frozen negative for malignancy plan for bilateral salpingectomy, and if positive for malignancy then BSO. We will check [place] to assess for residual function as this may change her mind. Plan for SLN injection and mapping, with biopsy pending on frozen section results.   Risks were discussed in detail. These include infection, anesthesia, bleeding, transfusion, wound separation, vaginal cuff dehiscence, medical issues (blood clots, stroke, heart attack, fluid in the lungs, pneumonia, abnormal heart rhythm, death), possible exploratory surgery with larger incision, lymphedema, lymphocyst, allergic reaction, injury to adjacent organs (bowel, bladder, blood vessels, nerves, ureters, uterus). Plan for OR 04/22/2015. I did offer her a  progesterone therapy to prevent progression of CAH. She was given precautions today.   Suggested return to clinic in 4 weeks postop if she has a malignancy, otherwise she will follow up with Dr. Georgianne Fick.    We discussed that the etiology of this abnormality is due to obesity.   The patient's diagnosis, an outline of the further diagnostic and laboratory studies which will be required, the recommendation, and alternatives were discussed.  All questions were answered to the patient's satisfaction.   Gillis Ends, MD    CC:  Dr. Malachy Mood, MD [facility] [phone]     Electronically signed by Gillis Ends, MD at 03/17/2016 4:18 PM    ADDENDUM: reviewed [facility] results and surgical plan.

## 2016-04-21 NOTE — Anesthesia Procedure Notes (Signed)
Procedure Name: Intubation Date/Time: 04/21/2016 12:43 PM Performed by: Darlyne Russian Pre-anesthesia Checklist: Patient identified, Emergency Drugs available, Suction available, Patient being monitored and Timeout performed Patient Re-evaluated:Patient Re-evaluated prior to inductionOxygen Delivery Method: Circle system utilized Preoxygenation: Pre-oxygenation with 100% oxygen Intubation Type: IV induction Ventilation: Mask ventilation without difficulty Laryngoscope Size: Mac and 3 Tube size: 7.0 mm Number of attempts: 1 Airway Equipment and Method: Stylet Placement Confirmation: ETT inserted through vocal cords under direct vision,  positive ETCO2 and breath sounds checked- equal and bilateral Secured at: 21 cm Tube secured with: Tape Dental Injury: Teeth and Oropharynx as per pre-operative assessment

## 2016-04-21 NOTE — H&P (Signed)
Date of Initial H&P: 04/21/16  History reviewed, patient examined, no change in status, stable for surgery.

## 2016-04-21 NOTE — Op Note (Signed)
Operative Note   Date 04/21/2016 Time 3:58 pm  PRE-OP DIAGNOSIS: Complex atypical hyperplasia    POST-OP DIAGNOSIS: Complex atypical hyperplasia SURGEON: Surgeon(s) and Role: Panel 1:    Dr. Malachy Mood  Panel 2:    * Zakeya Junker Gaetana Michaelis, MD - Secondary for Sentinel node injection, mapping. Biopsy not performed as frozen negative for cancer.   ASSISTANT:  Gillis Ends, MD for the [place] BSO  ANESTHESIA: Choice   PROCEDURE: Procedure(s): Sentinel node injection, mapping, and bilateral pelvic sentinel lymph node mapping  ESTIMATED BLOOD LOSS: Minimal  DRAINS: Foley  TOTAL IV FLUIDS: 1400 LR  SPECIMENS:  Hysterectomy, BSO, washings  COMPLICATIONS: None  DISPOSITION: stable to PACU  CONDITION: stable  INDICATIONS: Complex atypical hyperplasia  FINDINGS: Exam under anesthesia revealed a normal mobile anteverted uterus. There were no adnexal masses or nodularity. The parametria was smooth. The cervix was negative for gross lesions. Intraoperative findings included normal uterus. The adnexa were normal bilaterally. The upper abdomen was normal including omentum, bowel, liver, stomach, and diaphragmatic surfaces. There was no evidence of grossly malignant pelvic or right para-aortic lymph nodes. The sentinel node injection was successful with evidence of right primary sentinel external iliac vein node; and left primary sentinel external iliac vein node.  The patient has preoperative [place] that indicated menopause and thus she agreed with oophorectomy.    PROCEDURE IN DETAIL: After informed consent was obtained, the patient was taken to the operating room where anesthesia was obtained without difficulty. The patient was positioned in the dorsal lithotomy position in [place] and her arms were carefully tucked at her sides and the usual precautions were taken.  She was prepped and draped in normal sterile fashion.  Time-out was performed and a Foley catheter was  placed into the bladder and the cervix was infiltrated with 4 ml of ICG at 3 an 9 o'clock both superficial and deep injections. A standard VCare uterine manipulator was then placed in the uterus without incident.    An umbilical incision was made an carried down through the various layers. The Hasson port was placed and laparoscopic visualization confirmed entry. The bilateral robotic ports and a LUQ 5-12 port were placed.  The patient was placed in Trendelenburg and the bowel was displaced up into the upper abdomen.  Cytologic washings were obtained.  Round ligaments were divided on each side with the EndoShears and the retroperitoneal space was opened bilaterally.  The ureters were identified and preserved.  At this point the retroperitoneal spaces were developed and the lymphatic channels were mapped to each side.  The primary sentinel node on the right side was then identified. Similarly on the left side, the retroperitoneal spaces were developed, the lymphatic channels mapped to identify the sentinel node was identified. With hemostasis secured, the infundibulopelvic ligaments were skeletonized, sealed and divided with the LigaSure device.  A bladder flap was created and the bladder was dissected down off the lower uterine segment and cervix using endoshears and electrocautery.  The uterine arteries were skeletonized bilaterally, sealed and divided with the LigaSure device.  A colpotomy was performed circumferentially along the V-Care ring with electrocautery and the cervix was incised from the vagina and the specimen was removed through the vagina.  A pneumo balloon was placed in the vagina and the vaginal cuff was then closed in a running continuous fashion using the EndoStitch technique with 0 V-Lock suture with careful attention to include the vaginal cuff angles and the vaginal mucosa within the closure.  Intraoperative  pathologic evaluation negative for cancer.   Hemostasis was observed. The  intraperitoneal pressure was dropped, and all planes of dissection, vascular pedicles and the vaginal cuff were found to be hemostatic.  The LUQ trocar was removed and the fascia was closed with 0 Vicryl suture using the Endoclose technique. The lateral trocars were removed under visualization.   Before the umbilical trocar was removed the CO2 gas was released.  The fascia there was closed with 0 Vicryl suture in running fashion.   The skin incision at the umbilicus was closed with subcuticular stitch.  The remaining skin incisions were closed with Indermil glue.  The patient tolerated the procedure well.  Sponge, lap and needle counts were correct x2.  The patient was taken to recovery room in excellent condition.  Antibiotics: Given 1st or 2nd generation cephalosporin within an hour of the procedure. Discontinued within 24 hours.    VTE prophylaxis: was ordered perioperatively.   I assisted Dr. Georgianne Fick with the robotic [place] BSO. He assisted me with the SLN injection and mapping.  Gillis Ends, MD

## 2016-04-21 NOTE — Anesthesia Preprocedure Evaluation (Signed)
Anesthesia Evaluation  Patient identified by MRN, date of birth, ID band Patient awake    Reviewed: Allergy & Precautions, NPO status , Patient's Chart, lab work & pertinent test results, reviewed documented beta blocker date and time   Airway Mallampati: III  TM Distance: >3 FB     Dental  (+) Chipped   Pulmonary           Cardiovascular      Neuro/Psych  Headaches,    GI/Hepatic   Endo/Other  Hypothyroidism Morbid obesity  Renal/GU Renal disease     Musculoskeletal   Abdominal   Peds  Hematology  (+) anemia ,   Anesthesia Other Findings   Reproductive/Obstetrics                             Anesthesia Physical Anesthesia Plan  ASA: III  Anesthesia Plan: General   Post-op Pain Management:    Induction: Intravenous  Airway Management Planned: Oral ETT  Additional Equipment:   Intra-op Plan:   Post-operative Plan:   Informed Consent: I have reviewed the patients History and Physical, chart, labs and discussed the procedure including the risks, benefits and alternatives for the proposed anesthesia with the patient or authorized representative who has indicated his/her understanding and acceptance.     Plan Discussed with: CRNA  Anesthesia Plan Comments:         Anesthesia Quick Evaluation

## 2016-04-21 NOTE — Anesthesia Postprocedure Evaluation (Signed)
Anesthesia Post Note  Patient: Ann Robinson  Procedure(s) Performed: Procedure(s) (LRB): ROBOTIC ASSISTED TOTAL HYSTERECTOMY WITH BILATERAL SALPINGO OOPHORECTOMY (Bilateral) CYSTOSCOPY (N/A) SENTINEL NODE BIOPSY (N/A)  Patient location during evaluation: PACU Anesthesia Type: General Level of consciousness: awake and alert Pain management: pain level controlled Vital Signs Assessment: post-procedure vital signs reviewed and stable Respiratory status: spontaneous breathing, nonlabored ventilation, respiratory function stable and patient connected to nasal cannula oxygen Cardiovascular status: blood pressure returned to baseline and stable Postop Assessment: no signs of nausea or vomiting Anesthetic complications: no     Last Vitals:  Vitals:   04/21/16 2103 04/21/16 2200  BP: 121/65 (!) 124/59  Pulse: 64 (!) 52  Resp: 18 18  Temp: 36.2 C 36.9 C    Last Pain:  Vitals:   04/21/16 2222  TempSrc:   PainSc: 5                  Martha Clan

## 2016-04-21 NOTE — Transfer of Care (Signed)
Immediate Anesthesia Transfer of Care Note  Patient: Ann Robinson  Procedure(s) Performed: Procedure(s): ROBOTIC ASSISTED TOTAL HYSTERECTOMY WITH BILATERAL SALPINGO OOPHORECTOMY (Bilateral) CYSTOSCOPY (N/A) SENTINEL NODE BIOPSY (N/A)  Patient Location: PACU  Anesthesia Type:General  Level of Consciousness: awake and alert   Airway & Oxygen Therapy: Patient Spontanous Breathing and Patient connected to nasal cannula oxygen  Post-op Assessment: Report given to RN and Post -op Vital signs reviewed and stable  Post vital signs: Reviewed and stable  Last Vitals:  Vitals:   04/21/16 1040  BP: 139/84  Pulse: 77  Resp: 16  Temp: 36.4 C    Last Pain:  Vitals:   04/21/16 1040  TempSrc: Tympanic         Complications: No apparent anesthesia complications

## 2016-04-22 ENCOUNTER — Encounter: Payer: Self-pay | Admitting: Obstetrics and Gynecology

## 2016-04-22 DIAGNOSIS — N8502 Endometrial intraepithelial neoplasia [EIN]: Secondary | ICD-10-CM | POA: Diagnosis not present

## 2016-04-22 LAB — CBC
HCT: 36.3 % (ref 35.0–47.0)
Hemoglobin: 12.4 g/dL (ref 12.0–16.0)
MCH: 29 pg (ref 26.0–34.0)
MCHC: 34.3 g/dL (ref 32.0–36.0)
MCV: 84.7 fL (ref 80.0–100.0)
PLATELETS: 235 10*3/uL (ref 150–440)
RBC: 4.29 MIL/uL (ref 3.80–5.20)
RDW: 15.6 % — AB (ref 11.5–14.5)
WBC: 10.2 10*3/uL (ref 3.6–11.0)

## 2016-04-22 LAB — BASIC METABOLIC PANEL
ANION GAP: 5 (ref 5–15)
BUN: 5 mg/dL — AB (ref 6–20)
CALCIUM: 8.2 mg/dL — AB (ref 8.9–10.3)
CO2: 23 mmol/L (ref 22–32)
CREATININE: 0.64 mg/dL (ref 0.44–1.00)
Chloride: 112 mmol/L — ABNORMAL HIGH (ref 101–111)
GFR calc Af Amer: >60 mL/min (ref >60)
Glucose, Bld: 113 mg/dL — ABNORMAL HIGH (ref 65–99)
Potassium: 3.4 mmol/L — ABNORMAL LOW (ref 3.5–5.1)
Sodium: 140 mmol/L (ref 135–145)

## 2016-04-22 MED ORDER — IBUPROFEN 600 MG PO TABS
600.0000 mg | ORAL_TABLET | Freq: Four times a day (QID) | ORAL | Status: DC | PRN
  Administered 2016-04-22: 600 mg via ORAL
  Filled 2016-04-22: qty 1

## 2016-04-22 MED ORDER — OXYCODONE-ACETAMINOPHEN 5-325 MG PO TABS: 1.0000 | ORAL_TABLET | ORAL | 0 refills | Status: DC | PRN

## 2016-04-22 MED ORDER — IBUPROFEN 600 MG PO TABS: 600.0000 mg | ORAL_TABLET | Freq: Four times a day (QID) | ORAL | 0 refills | Status: DC | PRN

## 2016-04-22 NOTE — Discharge Summary (Signed)
Physician Discharge Summary  Patient ID: Ann Robinson MRN: [id number] DOB/AGE: 1960/04/19 56 y.o.  Admit date: 04/21/2016 Discharge date: 04/22/2016  Admission Diagnoses: Complex endometrial hyperplasia with atypia  Discharge Diagnoses:  Active Problems:   History of robot-assisted laparoscopic hysterectomy   Discharged Condition: good  Hospital Course: Patient underwent scheduled robotic TLH, BSO.  Frozen pathology without evidence of malignancy, lymph node dissection therefore not undertaken.  Patient did well overnight into POD1, tolerating po, voiding spontaneously, remained hemodynamically stable and afebrile.  Postoperative labs showed normal renal function and stable H&H.  Consults: None  Significant Diagnostic Studies: Results for orders placed or performed during the hospital encounter of 04/21/16 (from the past 24 hour(s))  ABO/Rh     Status: None   Collection Time: 04/21/16 10:47 AM  Result Value Ref Range   ABO/RH(D) O POS   CBC     Status: Abnormal   Collection Time: 04/22/16  5:17 AM  Result Value Ref Range   WBC 10.2 3.6 - 11.0 K/uL   RBC 4.29 3.80 - 5.20 MIL/uL   Hemoglobin 12.4 12.0 - 16.0 g/dL   HCT 36.3 35.0 - 47.0 %   MCV 84.7 80.0 - 100.0 fL   MCH 29.0 26.0 - 34.0 pg   MCHC 34.3 32.0 - 36.0 g/dL   RDW 15.6 (H) 11.5 - 14.5 %   Platelets 235 150 - 440 K/uL  Basic metabolic panel     Status: Abnormal   Collection Time: 04/22/16  5:17 AM  Result Value Ref Range   Sodium 140 135 - 145 mmol/L   Potassium 3.4 (L) 3.5 - 5.1 mmol/L   Chloride 112 (H) 101 - 111 mmol/L   CO2 23 22 - 32 mmol/L   Glucose, Bld 113 (H) 65 - 99 mg/dL   BUN 5 (L) 6 - 20 mg/dL   Creatinine, Ser 0.64 0.44 - 1.00 mg/dL   Calcium 8.2 (L) 8.9 - 10.3 mg/dL   GFR calc non Af Amer >60 >60 mL/min   GFR calc Af Amer >60 >60 mL/min   Anion gap 5 5 - 15     Treatments:  Sentinel lymph node injection, robotic TLH, BSO   Discharge Exam: Blood pressure (!) 102/49, pulse 60,  temperature 97.8 F (36.6 C), temperature source Oral, resp. rate 20, height 5\' 7"  (1.702 m), weight 299 lb (135.6 kg), last menstrual period 09/17/2013, SpO2 100 %. General appearance: alert, appears stated age and no distress Resp: no increased work of breathing GI: soft, non-tender; bowel sounds normal; no masses,  no organomegaly Extremities: extremities normal, atraumatic, no cyanosis or edema Incision/Wound: D/C/I trocar sites  Disposition: Final discharge disposition not confirmed  Discharge Instructions    Activity as tolerated    Complete by:  As directed    Call MD for:  difficulty breathing, headache or visual disturbances    Complete by:  As directed    Call MD for:  extreme fatigue    Complete by:  As directed    Call MD for:  hives    Complete by:  As directed    Call MD for:  persistant dizziness or light-headedness    Complete by:  As directed    Call MD for:  persistant nausea and vomiting    Complete by:  As directed    Call MD for:  redness, tenderness, or signs of infection (pain, swelling, redness, odor or green/yellow discharge around incision site)    Complete by:  As directed  Call MD for:  severe uncontrolled pain    Complete by:  As directed    Call MD for:  temperature >100.4    Complete by:  As directed    Diet - low sodium heart healthy    Complete by:  As directed    Driving restriction     Complete by:  As directed    Avoid driving for at least 1 weeks.   Lifting restrictions    Complete by:  As directed    Weight restriction of 10 lbs for 6 weeks.   No dressing needed    Complete by:  As directed    Other restrictions (specify):    Complete by:  As directed    No baths in the first 6 weeks, showers ok   Sexual acrtivity    Complete by:  As directed    No intercourse of tampons for 6 weeks     Allergies as of 04/22/2016      Reactions   Sulfa Antibiotics Rash      Medication List    STOP taking these medications   megestrol 40 MG  tablet Commonly known as:  MEGACE     TAKE these medications   cholecalciferol 1000 units tablet Commonly known as:  VITAMIN D Take 1,000 Units by mouth 2 (two) times a week.   FISH OIL PO Take 1 capsule by mouth daily.   ibuprofen 600 MG tablet Commonly known as:  ADVIL,MOTRIN Take 1 tablet (600 mg total) by mouth every 6 (six) hours as needed for fever or headache.   levothyroxine 125 MCG tablet Commonly known as:  SYNTHROID, LEVOTHROID Take 125 mcg by mouth daily before breakfast.   multivitamin with minerals Tabs tablet Take 1 tablet by mouth daily.   oxyCODONE-acetaminophen 5-325 MG tablet Commonly known as:  PERCOCET/ROXICET Take 1-2 tablets by mouth every 4 (four) hours as needed (moderate to severe pain (when tolerating fluids)).   Phenylephrine HCl 5 MG Tabs Take 10 mg by mouth as needed.      Follow-up Information    Kenlee Vogt, Stoney Bang, MD In 1 week.   Specialty:  Obstetrics and Gynecology Why:  For wound re-check Contact information: [address] [place] [address] [phone]           Signed: Dorthula Nettles 04/22/2016, 9:03 AM

## 2016-04-22 NOTE — Progress Notes (Signed)
Pt discharged home.  Discharge instructions, prescriptions and follow up appointment given to and reviewed with pt.  Pt verbalized understanding.  Escorted by auxillary. 

## 2016-04-23 LAB — SURGICAL PATHOLOGY

## 2016-04-23 LAB — CYTOLOGY - NON PAP

## 2016-04-27 ENCOUNTER — Encounter: Payer: 59 | Admitting: Unknown Physician Specialty

## 2016-04-29 ENCOUNTER — Telehealth: Payer: Self-pay

## 2016-04-29 NOTE — Telephone Encounter (Signed)
  Oncology Nurse Navigator Documentation Voicemail left with Ms.Hearne to return call. Would like to review her pathology with her. Navigator Location: [facility] (04/29/16 1100)   )Navigator Encounter Type: Telephone (04/29/16 1100) Telephone: Outgoing Call;Diagnostic Results (04/29/16 1100)                                                  Time Spent with Patient: 15 (04/29/16 1100)

## 2016-06-07 ENCOUNTER — Ambulatory Visit (INDEPENDENT_AMBULATORY_CARE_PROVIDER_SITE_OTHER): Payer: 59 | Admitting: Obstetrics and Gynecology

## 2016-06-07 DIAGNOSIS — Z09 Encounter for follow-up examination after completed treatment for conditions other than malignant neoplasm: Secondary | ICD-10-CM

## 2016-06-07 NOTE — Progress Notes (Signed)
      Postoperative Follow-up Patient presents post op from [place], BSO, cystoscopy 6weeks ago for endometrial hyperplasia.  Subjective: Patient reports marked improvement in her preop symptoms. Eating a regular diet without difficulty. The patient is not having any pain.  Activity: normal activities of daily living.  Objective: There were no vitals filed for this visit.   Assessment: 56 y.o. s/p TLH,BSO cystoscopy, stable  Plan: Patient has done well after surgery with no apparent complications.  I have discussed the post-operative course to date, and the expected progress moving forward.  The patient understands what complications to be concerned about.  I will see the patient in routine follow up, or sooner if needed.     Activity plan: No restriction.  Rondel Oh 06/07/2016, 2:21 PM

## 2016-06-07 NOTE — Patient Instructions (Signed)
Menopause Menopause is the normal time of life when menstrual periods stop completely. Menopause is complete when you have missed 12 consecutive menstrual periods. It usually occurs between the ages of 48 years and 55 years. Very rarely does a woman develop menopause before the age of 40 years. At menopause, your ovaries stop producing the female hormones estrogen and progesterone. This can cause undesirable symptoms and also affect your health. Sometimes the symptoms may occur 4-5 years before the menopause begins. There is no relationship between menopause and:  Oral contraceptives.  Number of children you had.  Race.  The age your menstrual periods started (menarche).  Heavy smokers and very thin women may develop menopause earlier in life. What are the causes?  The ovaries stop producing the female hormones estrogen and progesterone. Other causes include:  Surgery to remove both ovaries.  The ovaries stop functioning for no known reason.  Tumors of the pituitary gland in the brain.  Medical disease that affects the ovaries and hormone production.  Radiation treatment to the abdomen or pelvis.  Chemotherapy that affects the ovaries.  What are the signs or symptoms?  Hot flashes.  Night sweats.  Decrease in sex drive.  Vaginal dryness and thinning of the vagina causing painful intercourse.  Dryness of the skin and developing wrinkles.  Headaches.  Tiredness.  Irritability.  Memory problems.  Weight gain.  Bladder infections.  Hair growth of the face and chest.  Infertility. More serious symptoms include:  Loss of bone (osteoporosis) causing breaks (fractures).  Depression.  Hardening and narrowing of the arteries (atherosclerosis) causing heart attacks and strokes.  How is this diagnosed?  When the menstrual periods have stopped for 12 straight months.  Physical exam.  Hormone studies of the blood. How is this treated? There are many treatment  choices and nearly as many questions about them. The decisions to treat or not to treat menopausal changes is an individual choice made with your health care provider. Your health care provider can discuss the treatments with you. Together, you can decide which treatment will work best for you. Your treatment choices may include:  Hormone therapy (estrogen and progesterone).  Non-hormonal medicines.  Treating the individual symptoms with medicine (for example antidepressants for depression).  Herbal medicines that may help specific symptoms.  Counseling by a psychiatrist or psychologist.  Group therapy.  Lifestyle changes including: ? Eating healthy. ? Regular exercise. ? Limiting caffeine and alcohol. ? Stress management and meditation.  No treatment.  Follow these instructions at home:  Take the medicine your health care provider gives you as directed.  Get plenty of sleep and rest.  Exercise regularly.  Eat a diet that contains calcium (good for the bones) and soy products (acts like estrogen hormone).  Avoid alcoholic beverages.  Do not smoke.  If you have hot flashes, dress in layers.  Take supplements, calcium, and vitamin D to strengthen bones.  You can use over-the-counter lubricants or moisturizers for vaginal dryness.  Group therapy is sometimes very helpful.  Acupuncture may be helpful in some cases. Contact a health care provider if:  You are not sure you are in menopause.  You are having menopausal symptoms and need advice and treatment.  You are still having menstrual periods after age 55 years.  You have pain with intercourse.  Menopause is complete (no menstrual period for 12 months) and you develop vaginal bleeding.  You need a referral to a specialist (gynecologist, psychiatrist, or psychologist) for treatment. Get help right   away if:  You have severe depression.  You have excessive vaginal bleeding.  You fell and think you have a  broken bone.  You have pain when you urinate.  You develop leg or chest pain.  You have a fast pounding heart beat (palpitations).  You have severe headaches.  You develop vision problems.  You feel a lump in your breast.  You have abdominal pain or severe indigestion. This information is not intended to replace advice given to you by your health care provider. Make sure you discuss any questions you have with your health care provider. Document Released: 06/12/2003 Document Revised: 08/28/2015 Document Reviewed: 10/19/2012 Elsevier Interactive Patient Education  2017 Elsevier Inc.  

## 2016-06-08 DIAGNOSIS — E890 Postprocedural hypothyroidism: Secondary | ICD-10-CM | POA: Diagnosis not present

## 2016-06-11 DIAGNOSIS — E890 Postprocedural hypothyroidism: Secondary | ICD-10-CM | POA: Diagnosis not present

## 2016-07-20 DIAGNOSIS — H43812 Vitreous degeneration, left eye: Secondary | ICD-10-CM | POA: Diagnosis not present

## 2016-12-10 DIAGNOSIS — E039 Hypothyroidism, unspecified: Secondary | ICD-10-CM | POA: Diagnosis not present

## 2016-12-24 DIAGNOSIS — Z23 Encounter for immunization: Secondary | ICD-10-CM | POA: Diagnosis not present

## 2016-12-24 DIAGNOSIS — E890 Postprocedural hypothyroidism: Secondary | ICD-10-CM | POA: Diagnosis not present

## 2017-04-20 ENCOUNTER — Encounter: Payer: Self-pay | Admitting: Family Medicine

## 2017-04-20 ENCOUNTER — Ambulatory Visit: Payer: 59 | Admitting: Family Medicine

## 2017-04-20 VITALS — BP 123/83 | HR 74 | Temp 98.4°F | Wt 307.5 lb

## 2017-04-20 DIAGNOSIS — H66001 Acute suppurative otitis media without spontaneous rupture of ear drum, right ear: Secondary | ICD-10-CM

## 2017-04-20 MED ORDER — AMOXICILLIN 875 MG PO TABS: 875.0000 mg | ORAL_TABLET | Freq: Two times a day (BID) | ORAL | 0 refills | Status: DC

## 2017-04-20 NOTE — Progress Notes (Signed)
BP 123/83 (BP Location: Right Arm, Patient Position: Sitting, Cuff Size: Normal)   Pulse 74   Temp 98.4 F (36.9 C)   Wt (!) 307 lb 8 oz (139.5 kg)   LMP 09/17/2013 (Approximate)   SpO2 99%   BMI 48.16 kg/m    Subjective:    Patient ID: Ann Robinson, female    DOB: 1961/02/02, 57 y.o.   MRN: [id number]  HPI: Ann Robinson is a 57 y.o. female  Chief Complaint  Patient presents with  . URI  . Cough  . chest congestion   UPPER RESPIRATORY TRACT INFECTION Duration: about a week- worse yesterday Worst symptom: chest congestion and SOB Fever: no Cough: yes Shortness of breath: yes Wheezing: no Chest pain: yes, with cough Chest tightness: yes Chest congestion: yes Nasal congestion: yes Runny nose: yes Post nasal drip: yes Sneezing: yes Sore throat: yes Swollen glands: yes Sinus pressure: no Headache: no Face pain: no Toothache: no Ear pain: yes "right Ear pressure: yes "right Eyes red/itching:yes Eye drainage/crusting: no  Vomiting: no Rash: no Fatigue: yes Sick contacts: yes Strep contacts: no  Context: worse Recurrent sinusitis: no Relief with OTC cold/cough medications: no  Treatments attempted: cold/sinus   Relevant past medical, surgical, family and social history reviewed and updated as indicated. Interim medical history since our last visit reviewed. Allergies and medications reviewed and updated.  Review of Systems  Per HPI unless specifically indicated above     Objective:    BP 123/83 (BP Location: Right Arm, Patient Position: Sitting, Cuff Size: Normal)   Pulse 74   Temp 98.4 F (36.9 C)   Wt (!) 307 lb 8 oz (139.5 kg)   LMP 09/17/2013 (Approximate)   SpO2 99%   BMI 48.16 kg/m   Wt Readings from Last 3 Encounters:  04/20/17 (!) 307 lb 8 oz (139.5 kg)  04/30/16 291 lb (132 kg)  04/21/16 299 lb (135.6 kg)    Physical Exam  Constitutional: She is oriented to person, place, and time. She appears well-developed and  well-nourished. No distress.  HENT:  Head: Normocephalic and atraumatic.  Right Ear: Hearing, external ear and ear canal normal. Tympanic membrane is injected and bulging.  Left Ear: Hearing, tympanic membrane, external ear and ear canal normal.  Nose: Nose normal.  Mouth/Throat: Oropharynx is clear and moist. No oropharyngeal exudate.  Eyes: Conjunctivae, EOM and lids are normal. Pupils are equal, round, and reactive to light. Right eye exhibits no discharge. Left eye exhibits no discharge. No scleral icterus.  Neck: Normal range of motion. Neck supple. No JVD present. No tracheal deviation present. No thyromegaly present.  Cardiovascular: Normal rate, regular rhythm, normal heart sounds and intact distal pulses. Exam reveals no gallop and no friction rub.  No murmur heard. Pulmonary/Chest: Effort normal and breath sounds normal. No stridor. No respiratory distress. She has no wheezes. She has no rales. She exhibits no tenderness.  Musculoskeletal: Normal range of motion.  Lymphadenopathy:    She has no cervical adenopathy.  Neurological: She is alert and oriented to person, place, and time.  Skin: Skin is warm, dry and intact. No rash noted. She is not diaphoretic. No erythema. No pallor.  Psychiatric: She has a normal mood and affect. Her speech is normal and behavior is normal. Judgment and thought content normal. Cognition and memory are normal.  Nursing note and vitals reviewed.   Results for orders placed or performed during the hospital encounter of 04/21/16  CBC  Result Value Ref  Range   WBC 10.2 3.6 - 11.0 K/uL   RBC 4.29 3.80 - 5.20 MIL/uL   Hemoglobin 12.4 12.0 - 16.0 g/dL   HCT 36.3 35.0 - 47.0 %   MCV 84.7 80.0 - 100.0 fL   MCH 29.0 26.0 - 34.0 pg   MCHC 34.3 32.0 - 36.0 g/dL   RDW 15.6 (H) 11.5 - 14.5 %   Platelets 235 150 - 440 K/uL  Basic metabolic panel  Result Value Ref Range   Sodium 140 135 - 145 mmol/L   Potassium 3.4 (L) 3.5 - 5.1 mmol/L   Chloride 112 (H)  101 - 111 mmol/L   CO2 23 22 - 32 mmol/L   Glucose, Bld 113 (H) 65 - 99 mg/dL   BUN 5 (L) 6 - 20 mg/dL   Creatinine, Ser 0.64 0.44 - 1.00 mg/dL   Calcium 8.2 (L) 8.9 - 10.3 mg/dL   GFR calc non Af Amer >60 >60 mL/min   GFR calc Af Amer >60 >60 mL/min   Anion gap 5 5 - 15  ABO/Rh  Result Value Ref Range   ABO/RH(D) O POS   Cytology - Non PAP;  Result Value Ref Range   CYTOLOGY - NON GYN      Cytology - Non PAP CASE: [id number] PATIENT: Ann Robinson Non-Gyn Cytology Report     SPECIMEN SUBMITTED: A. Pelvic washings  CLINICAL HISTORY: None Provided  PRE-OPERATIVE DIAGNOSIS: Complex atypical endometrial hyperplasia  POST-OPERATIVE DIAGNOSIS: Same as pre-op     DIAGNOSIS: A. PELVIC WASHINGS: - NEGATIVE FOR MALIGNANCY. - MESOTHELIAL CELLS AND A FEW MACROPHAGES.  Comment: Slides reviewed: 2 cytospin slides, 1 ThinPrep slide, and 1 cell block   GROSS DESCRIPTION: A. Specimen Labeled: pelvic washings Volume: 550 mL Description: clear fluid in a clear plastic with blue Lid 1200 mL container Cell block(s): 1 and 1 ThinPrep Final Diagnosis performed by Bryan Lemma, MD.  Electronically signed 04/23/2016 4:32:00PM    The electronic signature indicates that the named Attending Pathologist has evaluated the specimen  Technical component performed at [facility], [address] [place], [place] [address] Lab: [phone] Dir: Darrick Penna. Evette Doffing , MD  Professional component performed at [facility], [facility], [place], [place], [place] [address] Lab: [phone] Dir: Dellia Nims. Reuel Derby, MD    Surgical pathology  Result Value Ref Range   SURGICAL PATHOLOGY      Surgical Pathology CASE: [id number] PATIENT: Ann Robinson Surgical Pathology Report     SPECIMEN SUBMITTED: A. Uterus, cervix, bilateral tubes and ovaries  CLINICAL HISTORY: None provided  PRE-OPERATIVE DIAGNOSIS: Complex atypical endometrial  hyperplasia  POST-OPERATIVE DIAGNOSIS: Same as pre-op     DIAGNOSIS: A. UTERUS WITH CERVIX; HYSTERECTOMY: - CERVIX WITHOUT PATHOLOGIC CHANGES. - ENDOMETRIAL POLYP WITH PROGESTIN EFFECT. - ENDOMETRIUM WITH PROGESTIN EFFECT. - ADENOMYOSIS AND SMALL INTRAMURAL LEIOMYOMAS (141 GRAM UTERUS). - NEGATIVE FOR ATYPIA AND MALIGNANCY.  FALLOPIAN TUBES AND OVARIES, BILATERAL; SALPINGO-OOPHORECTOMY: - ATROPHIC OVARIES. - 2.1 CM LEFT PARATUBAL CYST.   GROSS DESCRIPTION: A. Intraoperative Consultation:     Received: fresh     Specimen: Uterus, cervix, bilateral tubes and ovaries     Pathologic Evaluation: Frozen section     Diagnosis: Uterus with cervix; hysterectomy:     - Progestin effect.     - Negative for mal ignancy in one representative frozen section.     Communicated to: Dr. Theora Gianotti at 3:16 PM on 04/21/2016, Bryan Lemma M.D.     Tissue submitted: 1  Labeled: uterus, cervix, bilateral tubes  and ovaries Type of procedure: hysterectomy and bilateral salpingo-oophorectomy Adnexa: attached Weight of specimen: 141 grams Size of specimen:      Height: 4.5 cm      Anterior to posterior: 4.2 cm      Breadth at fundus: 4.5 cm Serosa: purple tan smooth Cervix: Ectocervix: 3.2 x 3.1 cm Endocervix: slightly trabecular pink-tan Length of endocervical canal: 3.1 cm  Endometrium:      Dimensions of endometrial cavity: 2.8 x 2.3 cm      Tumor findings:           Dimensions: No tumor seen           Appearance: Diffusely thickened endometrium.      Myometrial invasion: Not identified            Tumor involvement of cervix: thick endometrium is 2.9 cm from cervical os Thickness of myometrial wall: 2.1 cm Other findings and comments: 0.5 and 1.5 cm intramural white whorled nodules   Adnexa:      Right ovary:           Dimensions: 1.6 x 1.1 x 0.8 cm           External surface: lobulated pink-tan           Description: heterogeneous pink-tan      Right fallopian tube:            Dimensions: 6.2 cm long by 1.1 cm in diameter           Descriptions: fimbriated with a 0.5 cm paratubal cyst      Left ovary:           Dimensions: 2.5 x 1.2 x 0.8 cm           External surface: multilobulated purple tan           Description: heterogeneous pink-tan      Left fallopian tube:           Dimensions: 6.2 cm long and up to 1.5 cm in diameter           Description: fimbriated with a 2.1 cm paratubal cyst  Lymph nodes: none identified  Block summary: 1-frozen section remnant 2-representative anterior cervix 3-representative posterior cervix 4-8-anterior endomyometrium 9-13- posterior endomyometrium 14-representative cross-section and longitudinal fimbriated end right fallopian tube 15-representative right ovary 16- 17-representative cross-section long itudinal fimbriated end left fallopian tube with paratubal cyst 18-representative left ovary  Final Diagnosis performed by Bryan Lemma, MD.  Electronically signed 04/23/2016 4:31:14PM    The electronic signature indicates that the named Attending Pathologist has evaluated the specimen  Technical component performed at [facility], [address] [place], [place] [address] Lab: [phone] Dir: Darrick Penna. Evette Doffing, MD  Professional component performed at [facility], [facility], [place], [place], [place] [address] Lab: [phone] Dir: Dellia Nims. Rubinas, MD        Assessment & Plan:   Problem List Items Addressed This Visit    None    Visit Diagnoses    Acute suppurative otitis media of right ear without spontaneous rupture of tympanic membrane, recurrence not specified    -  Primary   Will treat with amoxicillin. Call with any concerns or if not getting better or worse.    Relevant Medications   amoxicillin (AMOXIL) 875 MG tablet       Follow up plan: Return if symptoms worsen or fail to improve.

## 2017-06-10 DIAGNOSIS — E890 Postprocedural hypothyroidism: Secondary | ICD-10-CM | POA: Diagnosis not present

## 2017-06-22 DIAGNOSIS — E890 Postprocedural hypothyroidism: Secondary | ICD-10-CM | POA: Diagnosis not present

## 2017-06-27 DIAGNOSIS — Z86010 Personal history of colonic polyps: Secondary | ICD-10-CM | POA: Diagnosis not present

## 2017-07-20 ENCOUNTER — Ambulatory Visit
Admission: RE | Admit: 2017-07-20 | Discharge: 2017-07-20 | Disposition: A | Discharge status: Home / Self Care | Payer: 59 | Source: Ambulatory Visit | Attending: Internal Medicine | Admitting: Internal Medicine

## 2017-07-20 ENCOUNTER — Other Ambulatory Visit: Payer: Self-pay

## 2017-07-20 ENCOUNTER — Ambulatory Visit: Payer: 59 | Admitting: Certified Registered Nurse Anesthetist

## 2017-07-20 ENCOUNTER — Encounter
Admission: RE | Disposition: A | Discharge status: Home / Self Care | Payer: Self-pay | Source: Ambulatory Visit | Attending: Internal Medicine

## 2017-07-20 ENCOUNTER — Encounter: Payer: Self-pay | Admitting: *Deleted

## 2017-07-20 DIAGNOSIS — K640 First degree hemorrhoids: Secondary | ICD-10-CM | POA: Diagnosis not present

## 2017-07-20 DIAGNOSIS — Z86010 Personal history of colonic polyps: Secondary | ICD-10-CM | POA: Diagnosis not present

## 2017-07-20 DIAGNOSIS — Z1211 Encounter for screening for malignant neoplasm of colon: Secondary | ICD-10-CM | POA: Insufficient documentation

## 2017-07-20 DIAGNOSIS — Z800 Family history of malignant neoplasm of digestive organs: Secondary | ICD-10-CM | POA: Diagnosis not present

## 2017-07-20 DIAGNOSIS — D123 Benign neoplasm of transverse colon: Secondary | ICD-10-CM | POA: Insufficient documentation

## 2017-07-20 DIAGNOSIS — E669 Obesity, unspecified: Secondary | ICD-10-CM | POA: Insufficient documentation

## 2017-07-20 DIAGNOSIS — Z882 Allergy status to sulfonamides status: Secondary | ICD-10-CM | POA: Insufficient documentation

## 2017-07-20 DIAGNOSIS — D126 Benign neoplasm of colon, unspecified: Secondary | ICD-10-CM | POA: Diagnosis not present

## 2017-07-20 DIAGNOSIS — Z79899 Other long term (current) drug therapy: Secondary | ICD-10-CM | POA: Insufficient documentation

## 2017-07-20 DIAGNOSIS — Z6841 Body Mass Index (BMI) 40.0 and over, adult: Secondary | ICD-10-CM | POA: Diagnosis not present

## 2017-07-20 DIAGNOSIS — E039 Hypothyroidism, unspecified: Secondary | ICD-10-CM | POA: Insufficient documentation

## 2017-07-20 DIAGNOSIS — K635 Polyp of colon: Secondary | ICD-10-CM | POA: Diagnosis not present

## 2017-07-20 DIAGNOSIS — D125 Benign neoplasm of sigmoid colon: Secondary | ICD-10-CM | POA: Insufficient documentation

## 2017-07-20 DIAGNOSIS — K648 Other hemorrhoids: Secondary | ICD-10-CM | POA: Diagnosis not present

## 2017-07-20 HISTORY — PX: COLONOSCOPY WITH PROPOFOL: SHX5780

## 2017-07-20 SURGERY — COLONOSCOPY WITH PROPOFOL
Anesthesia: General

## 2017-07-20 MED ORDER — LIDOCAINE HCL (PF) 2 % IJ SOLN
INTRAMUSCULAR | Status: AC
  Filled 2017-07-20: qty 10

## 2017-07-20 MED ORDER — SODIUM CHLORIDE 0.9 % IV SOLN
INTRAVENOUS | Status: DC
  Administered 2017-07-20: 15:00:00 via INTRAVENOUS

## 2017-07-20 MED ORDER — PROPOFOL 500 MG/50ML IV EMUL
INTRAVENOUS | Status: AC
  Filled 2017-07-20: qty 50

## 2017-07-20 MED ORDER — PROPOFOL 10 MG/ML IV BOLUS
INTRAVENOUS | Status: DC | PRN
  Administered 2017-07-20: 100 mg via INTRAVENOUS
  Administered 2017-07-20 (×4): 50 mg via INTRAVENOUS

## 2017-07-20 MED ORDER — PHENYLEPHRINE HCL 10 MG/ML IJ SOLN
INTRAMUSCULAR | Status: DC | PRN
  Administered 2017-07-20: 100 ug via INTRAVENOUS

## 2017-07-20 MED ORDER — LIDOCAINE HCL (CARDIAC) PF 100 MG/5ML IV SOSY
PREFILLED_SYRINGE | INTRAVENOUS | Status: DC | PRN
  Administered 2017-07-20: 20 mg via INTRATRACHEAL

## 2017-07-20 MED ORDER — PHENYLEPHRINE HCL 10 MG/ML IJ SOLN
INTRAMUSCULAR | Status: AC
  Filled 2017-07-20: qty 1

## 2017-07-20 NOTE — Transfer of Care (Signed)
Immediate Anesthesia Transfer of Care Note  Patient: Ann Robinson  Procedure(s) Performed: COLONOSCOPY WITH PROPOFOL (N/A )  Patient Location: PACU  Anesthesia Type:General  Level of Consciousness: awake, alert , oriented and patient cooperative  Airway & Oxygen Therapy: Patient Spontanous Breathing  Post-op Assessment: Report given to RN, Post -op Vital signs reviewed and stable and Patient moving all extremities  Post vital signs: Reviewed and stable  Last Vitals:  Vitals Value Taken Time  BP    Temp    Pulse    Resp    SpO2      Last Pain:  Vitals:   07/20/17 1418  TempSrc: Tympanic  PainSc: 0-No pain         Complications: No apparent anesthesia complications

## 2017-07-20 NOTE — Anesthesia Postprocedure Evaluation (Signed)
Anesthesia Post Note  Patient: ROYLENE HEATON  Procedure(s) Performed: COLONOSCOPY WITH PROPOFOL (N/A )  Patient location during evaluation: Endoscopy Anesthesia Type: General Level of consciousness: awake and alert, oriented and patient cooperative Pain management: satisfactory to patient Vital Signs Assessment: post-procedure vital signs reviewed and stable Respiratory status: spontaneous breathing and respiratory function stable Cardiovascular status: blood pressure returned to baseline and stable Postop Assessment: no headache, no backache, adequate PO intake, no apparent nausea or vomiting and patient able to bend at knees Anesthetic complications: no     Last Vitals:  Vitals:   07/20/17 1418 07/20/17 1616  BP: 128/85 (!) 93/48  Pulse: 82 67  Resp: 18 18  Temp: (!) 35.9 C (!) 36.2 C  SpO2: 97% 95%    Last Pain:  Vitals:   07/20/17 1616  TempSrc:   PainSc: 0-No pain                 Hellen Shanley H Ryka Beighley

## 2017-07-20 NOTE — Anesthesia Preprocedure Evaluation (Signed)
Anesthesia Evaluation  Patient identified by MRN, date of birth, ID band Patient awake    Reviewed: Allergy & Precautions, NPO status , Patient's Chart, lab work & pertinent test results  History of Anesthesia Complications Negative for: history of anesthetic complications  Airway Mallampati: II  TM Distance: >3 FB Neck ROM: Full    Dental no notable dental hx.    Pulmonary neg pulmonary ROS, neg sleep apnea, neg COPD,    breath sounds clear to auscultation- rhonchi (-) wheezing      Cardiovascular Exercise Tolerance: Good (-) hypertension(-) CAD, (-) Past MI, (-) Cardiac Stents and (-) CABG  Rhythm:Regular Rate:Normal - Systolic murmurs and - Diastolic murmurs    Neuro/Psych  Headaches, negative psych ROS   GI/Hepatic negative GI ROS, Neg liver ROS,   Endo/Other  neg diabetesHypothyroidism   Renal/GU Renal disease: hx of nephrolithiasis.     Musculoskeletal negative musculoskeletal ROS (+)   Abdominal (+) + obese,   Peds  Hematology  (+) anemia ,   Anesthesia Other Findings Past Medical History: No date: Anemia     Comment:  In the past No date: Goiter No date: History of kidney stones No date: Migraines 1988: Nephrolithiasis No date: Thyroid disease   Reproductive/Obstetrics                             Anesthesia Physical Anesthesia Plan  ASA: II  Anesthesia Plan: General   Post-op Pain Management:    Induction: Intravenous  PONV Risk Score and Plan: 2 and Propofol infusion  Airway Management Planned: Natural Airway  Additional Equipment:   Intra-op Plan:   Post-operative Plan:   Informed Consent: I have reviewed the patients History and Physical, chart, labs and discussed the procedure including the risks, benefits and alternatives for the proposed anesthesia with the patient or authorized representative who has indicated his/her understanding and acceptance.    Dental advisory given  Plan Discussed with: CRNA and Anesthesiologist  Anesthesia Plan Comments:         Anesthesia Quick Evaluation

## 2017-07-20 NOTE — Op Note (Signed)
[facility] Patient Name: Ann Robinson Procedure Date: 07/20/2017 3:51 PM MRN: [id number] Account #: [id number] Date of Birth: [birth year] Admit Type: Outpatient Age: 57 Room: [facility] ENDO ROOM 4 Gender: Female Note Status: Finalized Procedure:            Colonoscopy Indications:          High risk colon cancer surveillance: Personal history                        of adenoma with villous component, Last colonoscopy:                        December 2015 Providers:            Benay Pike. Alice Reichert MD, MD Referring MD:         [name] (Referring MD) Medicines:            Propofol per Anesthesia Complications:        No immediate complications. Procedure:            Pre-Anesthesia Assessment:                       - The risks and benefits of the procedure and the                        sedation options and risks were discussed with the                        patient. All questions were answered and informed                        consent was obtained.                       - Patient identification and proposed procedure were                        verified prior to the procedure by the nurse. The                        procedure was verified in the procedure room.                       - ASA Grade Assessment: II - A patient with mild                        systemic disease.                       - After reviewing the risks and benefits, the patient                        was deemed in satisfactory condition to undergo the                        procedure.                       After obtaining informed consent, the colonoscope was                        passed  under direct vision. Throughout the procedure,                        the patient's blood pressure, pulse, and oxygen                        saturations were monitored continuously. The                        Colonoscope was introduced through the anus and                        advanced to  the the cecum, identified by appendiceal                        orifice and ileocecal valve. The colonoscopy was                        performed without difficulty. The patient tolerated the                        procedure well. The quality of the bowel preparation                        was good. The ileocecal valve, appendiceal orifice, and                        rectum were photographed. Findings:      The perianal and digital rectal examinations were normal. Pertinent       negatives include normal sphincter tone and no palpable rectal lesions.      Three sessile polyps were found in the distal sigmoid colon. The polyps       were 2 to 3 mm in size. These polyps were removed with a jumbo cold       forceps. Resection and retrieval were complete.      A 4 mm polyp was found in the distal transverse colon. The polyp was       sessile. The polyp was removed with a jumbo cold forceps. Resection and       retrieval were complete.      Non-bleeding internal hemorrhoids were found during retroflexion. The       hemorrhoids were Grade I (internal hemorrhoids that do not prolapse).      The exam was otherwise without abnormality. Impression:           - Three 2 to 3 mm polyps in the distal sigmoid colon,                        removed with a jumbo cold forceps. Resected and                        retrieved.                       - One 4 mm polyp in the distal transverse colon,                        removed with a jumbo cold forceps. Resected and  retrieved.                       - Non-bleeding internal hemorrhoids.                       - The examination was otherwise normal. Recommendation:       - Patient has a contact number available for                        emergencies. The signs and symptoms of potential                        delayed complications were discussed with the patient.                        Return to normal activities tomorrow. Written discharge                         instructions were provided to the patient.                       - Resume previous diet.                       - Continue present medications.                       - Repeat colonoscopy is recommended for surveillance.                        The colonoscopy date will be determined after pathology                        results from today's exam become available for review.                       - Return to GI clinic PRN.                       - The findings and recommendations were discussed with                        the patient and their family. Procedure Code(s):    --- Professional ---                       [phone], Colonoscopy, flexible; with biopsy, single or                        multiple Diagnosis Code(s):    --- Professional ---                       K64.0, First degree hemorrhoids                       D12.3, Benign neoplasm of transverse colon (hepatic                        flexure or splenic flexure)                       D12.5, Benign neoplasm of sigmoid colon  Z86.010, Personal history of colonic polyps CPT copyright 2017 [employer]. All rights reserved. The codes documented in this report are preliminary and upon coder review may  be revised to meet current compliance requirements. Efrain Sella MD, MD 07/20/2017 4:16:38 PM This report has been signed electronically. Number of Addenda: 0 Note Initiated On: 07/20/2017 3:51 PM Scope Withdrawal Time: 0 hours 7 minutes 43 seconds  Total Procedure Duration: 0 hours 11 minutes 24 seconds       [facility]

## 2017-07-20 NOTE — H&P (Signed)
Outpatient short stay form Pre-procedure 07/20/2017 3:07 PM Ann Robinson, M.D.  Primary Physician: [name], M.D.  Reason for visit:  Personal hx of colon polyps.  History of present illness: 57 year old female who presents for personal history of colon polyps.  Patient denies any abdominal pain, change in bowel habits or rectal bleeding. Patient also has a family hx of colon cancer in her mother.  Last colonoscopy from 03/14/2014 revealed one villous adenomatous polyp.     Current Facility-Administered Medications:  .  0.9 %  sodium chloride infusion, , Intravenous, Continuous, [place], Benay Pike, MD, Last Rate: 20 mL/hr at 07/20/17 1432  Medications Prior to Admission  Medication Sig Dispense Refill Last Dose  . Ascorbic Acid (VITAMIN C) 100 MG tablet Take 100 mg by mouth daily.   Past Week at Unknown time  . Biotin 10 MG TABS Take by mouth.   Past Week at Unknown time  . Calcium-Magnesium-Vitamin D 300-150-400 MG-MG-UNIT TABS Take by mouth.   Past Week at Unknown time  . cholecalciferol (VITAMIN D) 1000 UNITS tablet Take 1,000 Units by mouth 2 (two) times a week.    Past Week at Unknown time  . ergocalciferol (VITAMIN D2) 50000 units capsule Take 50,000 Units by mouth once a week.   Past Week at Unknown time  . ibuprofen (ADVIL,MOTRIN) 600 MG tablet Take 1 tablet (600 mg total) by mouth every 6 (six) hours as needed for fever or headache. 30 tablet 0 Past Week at Unknown time  . levothyroxine (SYNTHROID, LEVOTHROID) 125 MCG tablet Take 125 mcg by mouth daily before breakfast.   07/20/2017 at 1200  . magnesium oxide (MAG-OX) 400 MG tablet Take 400 mg by mouth daily.   Past Week at Unknown time  . Multiple Vitamin (MULTIVITAMIN WITH MINERALS) TABS tablet Take 1 tablet by mouth daily.   Past Week at Unknown time  . Phenylephrine HCl 5 MG TABS Take 10 mg by mouth as needed.    Past Week at Unknown time  . amoxicillin (AMOXIL) 875 MG tablet Take 1 tablet (875 mg total) by mouth 2  (two) times daily. (Patient not taking: Reported on 07/20/2017) 20 tablet 0 Completed Course at Unknown time     Allergies  Allergen Reactions  . Sulfa Antibiotics Rash     Past Medical History:  Diagnosis Date  . Anemia    In the past  . Goiter   . History of kidney stones   . Migraines   . Nephrolithiasis 1988  . Thyroid disease     Review of systems:   Otherwise negative.    Physical Exam  Gen: Alert, oriented. Appears stated age.  HEENT: [place]/AT. PERRLA. Lungs: CTA, no wheezes. CV: RR nl S1, S2. Abd: soft, benign, no masses. BS+ Ext: No edema. Pulses 2+    Planned procedures: Colonoscopy. The patient understands the nature of the planned procedure, indications, risks, alternatives and potential complications including but not limited to bleeding, infection, perforation, damage to internal organs and possible oversedation/side effects from anesthesia. The patient agrees and gives consent to proceed.  Please refer to procedure notes for findings, recommendations and patient disposition/instructions.    Ann Robinson, M.D. Gastroenterology 07/20/2017  3:07 PM

## 2017-07-20 NOTE — Anesthesia Post-op Follow-up Note (Signed)
Anesthesia QCDR form completed.        

## 2017-07-22 LAB — SURGICAL PATHOLOGY

## 2017-09-28 ENCOUNTER — Encounter: Payer: Self-pay | Admitting: Unknown Physician Specialty

## 2017-09-28 ENCOUNTER — Ambulatory Visit (INDEPENDENT_AMBULATORY_CARE_PROVIDER_SITE_OTHER): Payer: 59 | Admitting: Unknown Physician Specialty

## 2017-09-28 VITALS — BP 113/72 | HR 64 | Temp 98.3°F | Ht 66.25 in | Wt 307.4 lb

## 2017-09-28 DIAGNOSIS — Z0000 Encounter for general adult medical examination without abnormal findings: Secondary | ICD-10-CM

## 2017-09-28 DIAGNOSIS — Z6841 Body Mass Index (BMI) 40.0 and over, adult: Secondary | ICD-10-CM

## 2017-09-28 DIAGNOSIS — E039 Hypothyroidism, unspecified: Secondary | ICD-10-CM | POA: Diagnosis not present

## 2017-09-28 NOTE — Progress Notes (Signed)
BP 113/72   Pulse 64   Temp 98.3 F (36.8 C) (Oral)   Ht 5' 6.25" (1.683 m)   Wt (!) 307 lb 6.4 oz (139.4 kg)   LMP 09/17/2013 (Approximate)   SpO2 98%   BMI 49.24 kg/m    Subjective:    Patient ID: Ann Robinson, female    DOB: Nov 12, 1960, 56 y.o.   MRN: [id number]  HPI: Ann Robinson is a 57 y.o. female  Chief Complaint  Patient presents with  . Annual Exam   Hypothyroid Pt sees an endocrinologist for her thyroid.  Stable dose for several years.    Social History   Socioeconomic History  . Marital status: Widowed    Spouse name: Not on file  . Number of children: Not on file  . Years of education: Not on file  . Highest education level: Not on file  Occupational History  . Not on file  Social Needs  . Financial resource strain: Not on file  . Food insecurity:    Worry: Not on file    Inability: Not on file  . Transportation needs:    Medical: Not on file    Non-medical: Not on file  Tobacco Use  . Smoking status: Never Smoker  . Smokeless tobacco: Never Used  Substance and Sexual Activity  . Alcohol use: Yes    Alcohol/week: 0.0 oz    Comment: occasional  . Drug use: No  . Sexual activity: Yes  Lifestyle  . Physical activity:    Days per week: Not on file    Minutes per session: Not on file  . Stress: Not on file  Relationships  . Social connections:    Talks on phone: Not on file    Gets together: Not on file    Attends religious service: Not on file    Active member of club or organization: Not on file    Attends meetings of clubs or organizations: Not on file    Relationship status: Not on file  . Intimate partner violence:    Fear of current or ex partner: Not on file    Emotionally abused: Not on file    Physically abused: Not on file    Forced sexual activity: Not on file  Other Topics Concern  . Not on file  Social History Narrative  . Not on file   Family History  Problem Relation Age of Onset  . Cancer Mother    colon and breast  . Diabetes Mother   . Alzheimer's disease Father   . Heart disease Father   . Hypertension Father   . Diabetes Father   . Diabetes Sister   . Hypertension Sister   . Diabetes Brother   . Liver disease Brother   . Heart disease Maternal Grandfather        MI  . Stroke Paternal Grandmother   . Thyroid disease Sister   . Diabetes Brother    Past Medical History:  Diagnosis Date  . Anemia    In the past  . Goiter   . History of kidney stones   . Migraines   . Nephrolithiasis 1988  . Thyroid disease    Past Surgical History:  Procedure Laterality Date  . ABDOMINAL HYSTERECTOMY    . CHOLECYSTECTOMY    . COLONOSCOPY WITH PROPOFOL N/A 07/20/2017   Procedure: COLONOSCOPY WITH PROPOFOL;  Surgeon: [place], Benay Pike, MD;  Location: [facility];  Service: Gastroenterology;  Laterality: N/A;  . CYSTOSCOPY  N/A 04/21/2016   Procedure: CYSTOSCOPY;  Surgeon: Gillis Ends, MD;  Location: [facility] ORS;  Service: Gynecology;  Laterality: N/A;  . ROBOTIC ASSISTED TOTAL HYSTERECTOMY WITH BILATERAL SALPINGO OOPHERECTOMY Bilateral 04/21/2016   Procedure: ROBOTIC ASSISTED TOTAL HYSTERECTOMY WITH BILATERAL SALPINGO OOPHORECTOMY;  Surgeon: Gillis Ends, MD;  Location: [facility] ORS;  Service: Gynecology;  Laterality: Bilateral;  . SENTINEL NODE BIOPSY N/A 04/21/2016   Procedure: SENTINEL NODE BIOPSY;  Surgeon: Gillis Ends, MD;  Location: [facility] ORS;  Service: Gynecology;  Laterality: N/A;     Relevant past medical, surgical, family and social history reviewed and updated as indicated. Interim medical history since our last visit reviewed. Allergies and medications reviewed and updated.  Review of Systems  Constitutional: Negative.   HENT: Negative.   Eyes: Negative.   Respiratory: Negative.   Cardiovascular: Negative.   Gastrointestinal: Negative.   Endocrine: Negative.   Genitourinary: Negative.   Musculoskeletal: Negative.   Skin: Negative.     Allergic/Immunologic: Negative.   Neurological: Negative.   Hematological: Negative.   Psychiatric/Behavioral: Negative.     Per HPI unless specifically indicated above     Objective:    BP 113/72   Pulse 64   Temp 98.3 F (36.8 C) (Oral)   Ht 5' 6.25" (1.683 m)   Wt (!) 307 lb 6.4 oz (139.4 kg)   LMP 09/17/2013 (Approximate)   SpO2 98%   BMI 49.24 kg/m   Wt Readings from Last 3 Encounters:  09/28/17 (!) 307 lb 6.4 oz (139.4 kg)  07/20/17 (!) 310 lb (140.6 kg)  04/20/17 (!) 307 lb 8 oz (139.5 kg)    Physical Exam  Constitutional: She is oriented to person, place, and time. She appears well-developed and well-nourished. No distress.  HENT:  Head: Normocephalic and atraumatic.  Eyes: Pupils are equal, round, and reactive to light. Conjunctivae and lids are normal. Right eye exhibits no discharge. Left eye exhibits no discharge. No scleral icterus.  Neck: Normal range of motion. Neck supple. No JVD present. Carotid bruit is not present. No thyromegaly present.  Cardiovascular: Normal rate, regular rhythm and normal heart sounds. Exam reveals no gallop and no friction rub.  No murmur heard. Pulmonary/Chest: Effort normal and breath sounds normal. No respiratory distress. She has no wheezes. She has no rales. No breast tenderness or discharge.  Abdominal: Soft. Normal appearance and bowel sounds are normal. There is no splenomegaly or hepatomegaly. There is no tenderness. There is no rebound.  Musculoskeletal: Normal range of motion.  Lymphadenopathy:    She has no cervical adenopathy.  Neurological: She is alert and oriented to person, place, and time.  Skin: Skin is warm, dry and intact. No rash noted. No pallor.  Psychiatric: She has a normal mood and affect. Her speech is normal and behavior is normal. Judgment and thought content normal. Cognition and memory are normal.     Assessment & Plan:   Problem List Items Addressed This Visit      Unprioritized    Hypothyroidism    Seeing Endocrine.  They check her thyroid levels      Obesity    Discussed dietary changes which she started.  Starting to exercise       Other Visit Diagnoses    Annual physical exam    -  Primary   Rx for [facility] iven   Relevant Orders   CBC with Differential/Platelet   Comprehensive metabolic panel   Lipid Panel w/o Chol/HDL Ratio   MM DIGITAL SCREENING BILATERAL  Follow up plan: Return in about 1 year (around 09/29/2018).

## 2017-09-28 NOTE — Assessment & Plan Note (Signed)
Seeing Endocrine.  They check her thyroid levels

## 2017-09-28 NOTE — Assessment & Plan Note (Addendum)
Discussed dietary changes which she started.  Starting to exercise

## 2017-09-28 NOTE — Patient Instructions (Addendum)
Please do call to schedule your mammogram; the number to schedule one at either [facility] or [facility] is [phone] ----------------------------------------------------------------    Preventive Care 40-64 Years, Female Preventive care refers to lifestyle choices and visits with your health care provider that can promote health and wellness. What does preventive care include?  A yearly physical exam. This is also called an annual well check.  Dental exams once or twice a year.  Routine eye exams. Ask your health care provider how often you should have your eyes checked.  Personal lifestyle choices, including: ? Daily care of your teeth and gums. ? Regular physical activity. ? Eating a healthy diet. ? Avoiding tobacco and drug use. ? Limiting alcohol use. ? Practicing safe sex. ? Taking low-dose aspirin daily starting at age 50. ? Taking vitamin and mineral supplements as recommended by your health care provider. What happens during an annual well check? The services and screenings done by your health care provider during your annual well check will depend on your age, overall health, lifestyle risk factors, and family history of disease. Counseling Your health care provider may ask you questions about your:  Alcohol use.  Tobacco use.  Drug use.  Emotional well-being.  Home and relationship well-being.  Sexual activity.  Eating habits.  Work and work environment.  Method of birth control.  Menstrual cycle.  Pregnancy history.  Screening You may have the following tests or measurements:  Height, weight, and BMI.  Blood pressure.  Lipid and cholesterol levels. These may be checked every 5 years, or more frequently if you are over 50 years old.  Skin check.  Lung cancer screening. You may have this screening every year starting at age 55 if you have a 30-pack-year history of smoking and currently smoke or have quit within  the past 15 years.  Fecal occult blood test (FOBT) of the stool. You may have this test every year starting at age 50.  Flexible sigmoidoscopy or colonoscopy. You may have a sigmoidoscopy every 5 years or a colonoscopy every 10 years starting at age 50.  Hepatitis C blood test.  Hepatitis B blood test.  Sexually transmitted disease (STD) testing.  Diabetes screening. This is done by checking your blood sugar (glucose) after you have not eaten for a while (fasting). You may have this done every 1-3 years.  Mammogram. This may be done every 1-2 years. Talk to your health care provider about when you should start having regular mammograms. This may depend on whether you have a family history of breast cancer.  BRCA-related cancer screening. This may be done if you have a family history of breast, ovarian, tubal, or peritoneal cancers.  Pelvic exam and Pap test. This may be done every 3 years starting at age 21. Starting at age 30, this may be done every 5 years if you have a Pap test in combination with an HPV test.  Bone density scan. This is done to screen for osteoporosis. You may have this scan if you are at high risk for osteoporosis.  Discuss your test results, treatment options, and if necessary, the need for more tests with your health care provider. Vaccines Your health care provider may recommend certain vaccines, such as:  Influenza vaccine. This is recommended every year.  Tetanus, diphtheria, and acellular pertussis (Tdap, Td) vaccine. You may need a Td booster every 10 years.  Varicella vaccine. You may need this if you have not been vaccinated.  Zoster vaccine.   You may need this after age 60.  Measles, mumps, and rubella (MMR) vaccine. You may need at least one dose of MMR if you were born in 1957 or later. You may also need a second dose.  Pneumococcal 13-valent conjugate (PCV13) vaccine. You may need this if you have certain conditions and were not previously  vaccinated.  Pneumococcal polysaccharide (PPSV23) vaccine. You may need one or two doses if you smoke cigarettes or if you have certain conditions.  Meningococcal vaccine. You may need this if you have certain conditions.  Hepatitis A vaccine. You may need this if you have certain conditions or if you travel or work in places where you may be exposed to hepatitis A.  Hepatitis B vaccine. You may need this if you have certain conditions or if you travel or work in places where you may be exposed to hepatitis B.  Haemophilus influenzae type b (Hib) vaccine. You may need this if you have certain conditions.  Talk to your health care provider about which screenings and vaccines you need and how often you need them. This information is not intended to replace advice given to you by your health care provider. Make sure you discuss any questions you have with your health care provider. Document Released: 04/18/2015 Document Revised: 12/10/2015 Document Reviewed: 01/21/2015 Elsevier Interactive Patient Education  2018 Elsevier Inc.  

## 2017-09-29 LAB — COMPREHENSIVE METABOLIC PANEL
A/G RATIO: 2 (ref 1.2–2.2)
ALBUMIN: 4.1 g/dL (ref 3.5–5.5)
ALK PHOS: 70 IU/L (ref 39–117)
ALT: 15 IU/L (ref 0–32)
AST: 13 IU/L (ref 0–40)
BUN / CREAT RATIO: 19 (ref 9–23)
BUN: 13 mg/dL (ref 6–24)
Bilirubin Total: 0.3 mg/dL (ref 0.0–1.2)
CO2: 23 mmol/L (ref 20–29)
CREATININE: 0.67 mg/dL (ref 0.57–1.00)
Calcium: 9.2 mg/dL (ref 8.7–10.2)
Chloride: 105 mmol/L (ref 96–106)
GFR calc Af Amer: 113 mL/min/{1.73_m2} (ref >59)
GFR calc non Af Amer: 98 mL/min/{1.73_m2} (ref >59)
GLOBULIN, TOTAL: 2.1 g/dL (ref 1.5–4.5)
Glucose: 83 mg/dL (ref 65–99)
POTASSIUM: 4.1 mmol/L (ref 3.5–5.2)
SODIUM: 141 mmol/L (ref 134–144)
Total Protein: 6.2 g/dL (ref 6.0–8.5)

## 2017-09-29 LAB — CBC WITH DIFFERENTIAL/PLATELET
BASOS: 1 %
Basophils Absolute: 0 10*3/uL (ref 0.0–0.2)
EOS (ABSOLUTE): 0.1 10*3/uL (ref 0.0–0.4)
EOS: 1 %
HEMATOCRIT: 37.2 % (ref 34.0–46.6)
HEMOGLOBIN: 12.6 g/dL (ref 11.1–15.9)
Immature Grans (Abs): 0 10*3/uL (ref 0.0–0.1)
Immature Granulocytes: 0 %
LYMPHS ABS: 2.3 10*3/uL (ref 0.7–3.1)
Lymphs: 38 %
MCH: 28.2 pg (ref 26.6–33.0)
MCHC: 33.9 g/dL (ref 31.5–35.7)
MCV: 83 fL (ref 79–97)
MONOCYTES: 5 %
MONOS ABS: 0.3 10*3/uL (ref 0.1–0.9)
NEUTROS ABS: 3.4 10*3/uL (ref 1.4–7.0)
Neutrophils: 55 %
Platelets: 322 10*3/uL (ref 150–450)
RBC: 4.47 x10E6/uL (ref 3.77–5.28)
RDW: 15.7 % — AB (ref 12.3–15.4)
WBC: 6.1 10*3/uL (ref 3.4–10.8)

## 2017-09-29 LAB — LIPID PANEL W/O CHOL/HDL RATIO
CHOLESTEROL TOTAL: 243 mg/dL — AB (ref 100–199)
HDL: 51 mg/dL (ref >39)
LDL CALC: 174 mg/dL — AB (ref 0–99)
Triglycerides: 90 mg/dL (ref 0–149)
VLDL CHOLESTEROL CAL: 18 mg/dL (ref 5–40)

## 2017-09-30 ENCOUNTER — Encounter: Payer: Self-pay | Admitting: Unknown Physician Specialty

## 2017-10-12 ENCOUNTER — Encounter: Payer: Self-pay | Admitting: Unknown Physician Specialty

## 2017-11-07 ENCOUNTER — Ambulatory Visit
Admission: RE | Admit: 2017-11-07 | Discharge: 2017-11-07 | Disposition: A | Discharge status: Home / Self Care | Payer: 59 | Source: Ambulatory Visit | Attending: Unknown Physician Specialty | Admitting: Unknown Physician Specialty

## 2017-11-07 DIAGNOSIS — Z0000 Encounter for general adult medical examination without abnormal findings: Secondary | ICD-10-CM | POA: Insufficient documentation

## 2017-11-07 DIAGNOSIS — Z1231 Encounter for screening mammogram for malignant neoplasm of breast: Secondary | ICD-10-CM | POA: Diagnosis not present

## 2017-12-16 DIAGNOSIS — E890 Postprocedural hypothyroidism: Secondary | ICD-10-CM | POA: Diagnosis not present

## 2017-12-27 DIAGNOSIS — E890 Postprocedural hypothyroidism: Secondary | ICD-10-CM | POA: Diagnosis not present

## 2018-02-01 DIAGNOSIS — Z23 Encounter for immunization: Secondary | ICD-10-CM | POA: Diagnosis not present

## 2019-01-29 ENCOUNTER — Telehealth: Payer: Self-pay | Admitting: Nurse Practitioner

## 2019-01-29 DIAGNOSIS — Z1231 Encounter for screening mammogram for malignant neoplasm of breast: Secondary | ICD-10-CM

## 2019-01-29 NOTE — Telephone Encounter (Signed)
Mammogram order placed, but patient due to annual physical.

## 2019-01-29 NOTE — Telephone Encounter (Signed)
Copied from [place] [id number]. Topic: General - Other >> Jan 29, 2019  3:01 PM Rayann Heman wrote: Reason for CRM: pt called and stated that she would like an order put in for a mammogram. Please advise

## 2019-01-29 NOTE — Telephone Encounter (Signed)
Called and LVM for patient letting her know that Jolene put in the order for her mammogram. Also asked for patient to please call back to schedule her annual physical.

## 2019-02-13 ENCOUNTER — Other Ambulatory Visit: Payer: Self-pay

## 2019-02-13 ENCOUNTER — Ambulatory Visit (INDEPENDENT_AMBULATORY_CARE_PROVIDER_SITE_OTHER): Payer: 59 | Admitting: Nurse Practitioner

## 2019-02-13 ENCOUNTER — Encounter: Payer: Self-pay | Admitting: Nurse Practitioner

## 2019-02-13 VITALS — BP 123/82 | HR 73 | Temp 98.7°F | Ht 67.0 in | Wt 335.0 lb

## 2019-02-13 DIAGNOSIS — Z0000 Encounter for general adult medical examination without abnormal findings: Secondary | ICD-10-CM

## 2019-02-13 DIAGNOSIS — G43909 Migraine, unspecified, not intractable, without status migrainosus: Secondary | ICD-10-CM | POA: Diagnosis not present

## 2019-02-13 DIAGNOSIS — E559 Vitamin D deficiency, unspecified: Secondary | ICD-10-CM | POA: Insufficient documentation

## 2019-02-13 DIAGNOSIS — E039 Hypothyroidism, unspecified: Secondary | ICD-10-CM

## 2019-02-13 MED ORDER — SHINGRIX 50 MCG/0.5ML IM SUSR: 0.5000 mL | Freq: Once | INTRAMUSCULAR | 0 refills | Status: AC

## 2019-02-13 MED ORDER — LEVOTHYROXINE SODIUM 137 MCG PO TABS: 137.0000 ug | ORAL_TABLET | Freq: Every day | ORAL | 3 refills | Status: DC

## 2019-02-13 NOTE — Assessment & Plan Note (Signed)
Recommend continued focus on health diet choices and regular physical activity (30 minutes 5 days a week). Work towards [employer].

## 2019-02-13 NOTE — Assessment & Plan Note (Signed)
Chronic, ongoing.  Followed by endocrinology.  Recommend continuing current dose and will review Dr. Gabriel Carina notes when available.  No thyroid labs today, are assessed with endo.

## 2019-02-13 NOTE — Patient Instructions (Signed)
Hypothyroidism  Hypothyroidism is when the thyroid gland does not make enough of certain hormones (it is underactive). The thyroid gland is a small gland located in the lower front part of the neck, just in front of the windpipe (trachea). This gland makes hormones that help control how the body uses food for energy (metabolism) as well as how the heart and brain function. These hormones also play a role in keeping your bones strong. When the thyroid is underactive, it produces too little of the hormones thyroxine (T4) and triiodothyronine (T3). What are the causes? This condition may be caused by:  Hashimoto's disease. This is a disease in which the body's disease-fighting system (immune system) attacks the thyroid gland. This is the most common cause.  Viral infections.  Pregnancy.  Certain medicines.  Birth defects.  Past radiation treatments to the head or neck for cancer.  Past treatment with radioactive iodine.  Past exposure to radiation in the environment.  Past surgical removal of part or all of the thyroid.  Problems with a gland in the center of the brain (pituitary gland).  Lack of enough iodine in the diet. What increases the risk? You are more likely to develop this condition if:  You are female.  You have a family history of thyroid conditions.  You use a medicine called lithium.  You take medicines that affect the immune system (immunosuppressants). What are the signs or symptoms? Symptoms of this condition include:  Feeling as though you have no energy (lethargy).  Not being able to tolerate cold.  Weight gain that is not explained by a change in diet or exercise habits.  Lack of appetite.  Dry skin.  Coarse hair.  Menstrual irregularity.  Slowing of thought processes.  Constipation.  Sadness or depression. How is this diagnosed? This condition may be diagnosed based on:  Your symptoms, your medical history, and a physical exam.  Blood  tests. You may also have imaging tests, such as an ultrasound or MRI. How is this treated? This condition is treated with medicine that replaces the thyroid hormones that your body does not make. After you begin treatment, it may take several weeks for symptoms to go away. Follow these instructions at home:  Take over-the-counter and prescription medicines only as told by your health care provider.  If you start taking any new medicines, tell your health care provider.  Keep all follow-up visits as told by your health care provider. This is important. ? As your condition improves, your dosage of thyroid hormone medicine may change. ? You will need to have blood tests regularly so that your health care provider can monitor your condition. Contact a health care provider if:  Your symptoms do not get better with treatment.  You are taking thyroid replacement medicine and you: ? Sweat a lot. ? Have tremors. ? Feel anxious. ? Lose weight rapidly. ? Cannot tolerate heat. ? Have emotional swings. ? Have diarrhea. ? Feel weak. Get help right away if you have:  Chest pain.  An irregular heartbeat.  A rapid heartbeat.  Difficulty breathing. Summary  Hypothyroidism is when the thyroid gland does not make enough of certain hormones (it is underactive).  When the thyroid is underactive, it produces too little of the hormones thyroxine (T4) and triiodothyronine (T3).  The most common cause is Hashimoto's disease, a disease in which the body's disease-fighting system (immune system) attacks the thyroid gland. The condition can also be caused by viral infections, medicine, pregnancy, or past   radiation treatment to the head or neck.  Symptoms may include weight gain, dry skin, constipation, feeling as though you do not have energy, and not being able to tolerate cold.  This condition is treated with medicine to replace the thyroid hormones that your body does not make. This information  is not intended to replace advice given to you by your health care provider. Make sure you discuss any questions you have with your health care provider. Document Released: 03/22/2005 Document Revised: 03/04/2017 Document Reviewed: 03/02/2017 Elsevier Patient Education  2020 Elsevier Inc.  

## 2019-02-13 NOTE — Assessment & Plan Note (Signed)
Recheck Vit D level today and continue daily supplement.

## 2019-02-13 NOTE — Assessment & Plan Note (Signed)
Improved, none in 7 years.  If continues to be migraine free will resolved this issue in upcoming years. 

## 2019-02-13 NOTE — Progress Notes (Signed)
BP 123/82   Pulse 73   Temp 98.7 F (37.1 C) (Oral)   Ht 5\' 7"  (1.702 m)   Wt (!) 335 lb (152 kg)   LMP 09/17/2013 (Approximate)   SpO2 97%   BMI 52.47 kg/m    Subjective:    Patient ID: Ann Robinson, female    DOB: 22-Feb-1961, 58 y.o.   MRN: [id number]  HPI: Ann Robinson is a 58 y.o. female presenting on 02/13/2019 for comprehensive medical examination. Current medical complaints include:none  She currently lives with: significant other Menopausal Symptoms: no   HYPOTHYROIDISM Sees Dr. Gabriel Carina for this and takes Levothyroxine 137 MCG. Thyroid control status:stable Satisfied with current treatment? yes Medication side effects: no Medication compliance: good compliance Etiology of hypothyroidism:  Recent dose adjustment:yes Fatigue: no Cold intolerance: no Heat intolerance: no Weight gain: no Weight loss: no Constipation: no Diarrhea/loose stools: no Palpitations: no Lower extremity edema: no Anxiety/depressed mood: no   MIGRAINES: Has had none since going through menopause, went into menopause 7 years ago.  Functional Status Survey: Is the patient deaf or have difficulty hearing?: No Does the patient have difficulty seeing, even when wearing glasses/contacts?: No Does the patient have difficulty concentrating, remembering, or making decisions?: No Does the patient have difficulty walking or climbing stairs?: Yes Does the patient have difficulty dressing or bathing?: No Does the patient have difficulty doing errands alone such as visiting a doctor's office or shopping?: No  Depression Screen done today and results listed below:  Depression screen [facility] 2/9 02/13/2019 09/28/2017 04/20/2017 04/23/2015  Decreased Interest 0 0 0 0  Down, Depressed, Hopeless 0 0 0 0  PHQ - 2 Score 0 0 0 0  Altered sleeping 0 - - -  Tired, decreased energy 1 - - -  Change in appetite 1 - - -  Feeling bad or failure about yourself  0 - - -  Trouble concentrating 0 - - -   Moving slowly or fidgety/restless 0 - - -  Suicidal thoughts 0 - - -  PHQ-9 Score 2 - - -  Difficult doing work/chores Not difficult at all - - -   GAD 7 : Generalized Anxiety Score 02/13/2019  Nervous, Anxious, on Edge 0  Control/stop worrying 0  Worry too much - different things 0  Trouble relaxing 0  Restless 0  Easily annoyed or irritable 0  Afraid - awful might happen 0  Total GAD 7 Score 0    The patient does not have a history of falls. I did not complete a risk assessment for falls. A plan of care for falls was not documented.   Past Medical History:  Past Medical History:  Diagnosis Date  . Anemia    In the past  . Goiter   . History of kidney stones   . Migraines   . Nephrolithiasis 1988  . Thyroid disease     Surgical History:  Past Surgical History:  Procedure Laterality Date  . ABDOMINAL HYSTERECTOMY    . CHOLECYSTECTOMY    . COLONOSCOPY WITH PROPOFOL N/A 07/20/2017   Procedure: COLONOSCOPY WITH PROPOFOL;  Surgeon: Toledo, Benay Pike, MD;  Location: [facility] ENDOSCOPY;  Service: Gastroenterology;  Laterality: N/A;  . CYSTOSCOPY N/A 04/21/2016   Procedure: CYSTOSCOPY;  Surgeon: Gillis Ends, MD;  Location: [facility] ORS;  Service: Gynecology;  Laterality: N/A;  . ROBOTIC ASSISTED TOTAL HYSTERECTOMY WITH BILATERAL SALPINGO OOPHERECTOMY Bilateral 04/21/2016   Procedure: ROBOTIC ASSISTED TOTAL HYSTERECTOMY WITH BILATERAL SALPINGO OOPHORECTOMY;  Surgeon:  Angeles Gaetana Michaelis, MD;  Location: [facility] ORS;  Service: Gynecology;  Laterality: Bilateral;  . SENTINEL NODE BIOPSY N/A 04/21/2016   Procedure: SENTINEL NODE BIOPSY;  Surgeon: Gillis Ends, MD;  Location: [facility] ORS;  Service: Gynecology;  Laterality: N/A;    Medications:  Current Outpatient Medications on File Prior to Visit  Medication Sig  . Biotin 10 MG TABS Take by mouth.  . Calcium-Magnesium-Vitamin D 300-150-400 MG-MG-UNIT TABS Take by mouth.  . cholecalciferol (VITAMIN D) 1000 UNITS tablet  Take 1,000 Units by mouth 2 (two) times a week.   Marland Kitchen ibuprofen (ADVIL,MOTRIN) 600 MG tablet Take 1 tablet (600 mg total) by mouth every 6 (six) hours as needed for fever or headache.  . magnesium oxide (MAG-OX) 400 MG tablet Take 400 mg by mouth daily.  . Multiple Vitamin (MULTIVITAMIN WITH MINERALS) TABS tablet Take 1 tablet by mouth daily.  Marland Kitchen Phenylephrine HCl 5 MG TABS Take 10 mg by mouth as needed.    No current facility-administered medications on file prior to visit.     Allergies:  Allergies  Allergen Reactions  . Sulfa Antibiotics Rash    Social History:  Social History   Socioeconomic History  . Marital status: Widowed    Spouse name: Not on file  . Number of children: Not on file  . Years of education: Not on file  . Highest education level: Not on file  Occupational History  . Not on file  Social Needs  . Financial resource strain: Not on file  . Food insecurity    Worry: Not on file    Inability: Not on file  . Transportation needs    Medical: Not on file    Non-medical: Not on file  Tobacco Use  . Smoking status: Never Smoker  . Smokeless tobacco: Never Used  Substance and Sexual Activity  . Alcohol use: Yes    Alcohol/week: 0.0 standard drinks    Comment: occasional  . Drug use: No  . Sexual activity: Yes  Lifestyle  . Physical activity    Days per week: Not on file    Minutes per session: Not on file  . Stress: Not on file  Relationships  . Social Herbalist on phone: Not on file    Gets together: Not on file    Attends religious service: Not on file    Active member of club or organization: Not on file    Attends meetings of clubs or organizations: Not on file    Relationship status: Not on file  . Intimate partner violence    Fear of current or ex partner: Not on file    Emotionally abused: Not on file    Physically abused: Not on file    Forced sexual activity: Not on file  Other Topics Concern  . Not on file  Social History  Narrative  . Not on file   Social History   Tobacco Use  Smoking Status Never Smoker  Smokeless Tobacco Never Used   Social History   Substance and Sexual Activity  Alcohol Use Yes  . Alcohol/week: 0.0 standard drinks   Comment: occasional    Family History:  Family History  Problem Relation Age of Onset  . Cancer Mother        colon and breast  . Diabetes Mother   . Breast cancer Mother 39  . Alzheimer's disease Father   . Heart disease Father   . Hypertension Father   . Diabetes  Father   . Diabetes Sister   . Hypertension Sister   . Diabetes Brother   . Liver disease Brother   . Heart disease Maternal Grandfather        MI  . Stroke Paternal Grandmother   . Thyroid disease Sister   . Diabetes Brother   . Breast cancer Maternal Aunt        late 77's early 45's  . Breast cancer Cousin 81    Past medical history, surgical history, medications, allergies, family history and social history reviewed with patient today and changes made to appropriate areas of the chart.   Review of Systems - negative All other ROS negative except what is listed above and in the HPI.      Objective:    BP 123/82   Pulse 73   Temp 98.7 F (37.1 C) (Oral)   Ht 5\' 7"  (1.702 m)   Wt (!) 335 lb (152 kg)   LMP 09/17/2013 (Approximate)   SpO2 97%   BMI 52.47 kg/m   Wt Readings from Last 3 Encounters:  02/13/19 (!) 335 lb (152 kg)  09/28/17 (!) 307 lb 6.4 oz (139.4 kg)  07/20/17 (!) 310 lb (140.6 kg)    Physical Exam Constitutional:      General: She is awake. She is not in acute distress.    Appearance: She is well-developed. She is not ill-appearing.  HENT:     Head: Normocephalic and atraumatic.     Right Ear: Hearing, tympanic membrane, ear canal and external ear normal. No drainage.     Left Ear: Hearing, tympanic membrane, ear canal and external ear normal. No drainage.     Nose: Nose normal.     Right Sinus: No maxillary sinus tenderness or frontal sinus tenderness.      Left Sinus: No maxillary sinus tenderness or frontal sinus tenderness.     Mouth/Throat:     Mouth: Mucous membranes are moist.     Pharynx: Oropharynx is clear. Uvula midline. No pharyngeal swelling, oropharyngeal exudate or posterior oropharyngeal erythema.  Eyes:     General: Lids are normal.        Right eye: No discharge.        Left eye: No discharge.     Extraocular Movements: Extraocular movements intact.     Conjunctiva/sclera: Conjunctivae normal.     Pupils: Pupils are equal, round, and reactive to light.     Visual Fields: Right eye visual fields normal and left eye visual fields normal.  Neck:     Musculoskeletal: Normal range of motion and neck supple.     Thyroid: No thyromegaly.     Vascular: No carotid bruit.     Trachea: Trachea normal.  Cardiovascular:     Rate and Rhythm: Normal rate and regular rhythm.     Heart sounds: Normal heart sounds. No murmur. No gallop.   Pulmonary:     Effort: Pulmonary effort is normal. No accessory muscle usage or respiratory distress.     Breath sounds: Normal breath sounds.  Chest:     Breasts:        Right: Normal.        Left: Normal.  Abdominal:     General: Bowel sounds are normal.     Palpations: Abdomen is soft. There is no hepatomegaly or splenomegaly.     Tenderness: There is no abdominal tenderness.  Musculoskeletal: Normal range of motion.     Right lower leg: No edema.  Left lower leg: No edema.  Lymphadenopathy:     Head:     Right side of head: No submental, submandibular, tonsillar, preauricular or posterior auricular adenopathy.     Left side of head: No submental, submandibular, tonsillar, preauricular or posterior auricular adenopathy.     Cervical: No cervical adenopathy.     Upper Body:     Right upper body: No supraclavicular, axillary or pectoral adenopathy.     Left upper body: No supraclavicular, axillary or pectoral adenopathy.  Skin:    General: Skin is warm and dry.     Capillary Refill:  Capillary refill takes less than 2 seconds.     Findings: No rash.  Neurological:     Mental Status: She is alert and oriented to person, place, and time.     Cranial Nerves: Cranial nerves are intact.     Gait: Gait is intact.     Deep Tendon Reflexes: Reflexes are normal and symmetric.     Reflex Scores:      Brachioradialis reflexes are 2+ on the right side and 2+ on the left side.      Patellar reflexes are 2+ on the right side and 2+ on the left side. Psychiatric:        Attention and Perception: Attention normal.        Mood and Affect: Mood normal.        Speech: Speech normal.        Behavior: Behavior normal. Behavior is cooperative.        Thought Content: Thought content normal.        Judgment: Judgment normal.     Results for orders placed or performed in visit on 09/28/17  CBC with Differential/Platelet  Result Value Ref Range   WBC 6.1 3.4 - 10.8 x10E3/uL   RBC 4.47 3.77 - 5.28 x10E6/uL   Hemoglobin 12.6 11.1 - 15.9 g/dL   Hematocrit 37.2 34.0 - 46.6 %   MCV 83 79 - 97 fL   MCH 28.2 26.6 - 33.0 pg   MCHC 33.9 31.5 - 35.7 g/dL   RDW 15.7 (H) 12.3 - 15.4 %   Platelets 322 150 - 450 x10E3/uL   Neutrophils 55 Not Estab. %   Lymphs 38 Not Estab. %   Monocytes 5 Not Estab. %   Eos 1 Not Estab. %   Basos 1 Not Estab. %   Neutrophils Absolute 3.4 1.4 - 7.0 x10E3/uL   Lymphocytes Absolute 2.3 0.7 - 3.1 x10E3/uL   Monocytes Absolute 0.3 0.1 - 0.9 x10E3/uL   EOS (ABSOLUTE) 0.1 0.0 - 0.4 x10E3/uL   Basophils Absolute 0.0 0.0 - 0.2 x10E3/uL   Immature Granulocytes 0 Not Estab. %   Immature Grans (Abs) 0.0 0.0 - 0.1 x10E3/uL  Comprehensive metabolic panel  Result Value Ref Range   Glucose 83 65 - 99 mg/dL   BUN 13 6 - 24 mg/dL   Creatinine, Ser 0.67 0.57 - 1.00 mg/dL   GFR calc non Af Amer 98 >59 mL/min/1.73   GFR calc Af Amer 113 >59 mL/min/1.73   BUN/Creatinine Ratio 19 9 - 23   Sodium 141 134 - 144 mmol/L   Potassium 4.1 3.5 - 5.2 mmol/L   Chloride 105 96 -  106 mmol/L   CO2 23 20 - 29 mmol/L   Calcium 9.2 8.7 - 10.2 mg/dL   Total Protein 6.2 6.0 - 8.5 g/dL   Albumin 4.1 3.5 - 5.5 g/dL   Globulin, Total 2.1 1.5 -  4.5 g/dL   Albumin/Globulin Ratio 2.0 1.2 - 2.2   Bilirubin Total 0.3 0.0 - 1.2 mg/dL   Alkaline Phosphatase 70 39 - 117 IU/L   AST 13 0 - 40 IU/L   ALT 15 0 - 32 IU/L  Lipid Panel w/o Chol/HDL Ratio  Result Value Ref Range   Cholesterol, Total 243 (H) 100 - 199 mg/dL   Triglycerides 90 0 - 149 mg/dL   HDL 51 >39 mg/dL   VLDL Cholesterol Cal 18 5 - 40 mg/dL   LDL Calculated 174 (H) 0 - 99 mg/dL      Assessment & Plan:   Problem List Items Addressed This Visit      Cardiovascular and Mediastinum   Migraine    Improved, none in 7 years.  If continues to be migraine free will resolved this issue in upcoming years.      Relevant Orders   CBC with Differential/Platelet out   Comprehensive metabolic panel   Magnesium     Endocrine   Hypothyroidism    Chronic, ongoing.  Followed by endocrinology.  Recommend continuing current dose and will review Dr. Gabriel Carina notes when available.  No thyroid labs today, are assessed with endo.        Relevant Medications   levothyroxine (SYNTHROID) 137 MCG tablet     Other   Morbid obesity ([place])    Recommend continued focus on health diet choices and regular physical activity (30 minutes 5 days a week). Work towards [employer].       Vitamin D deficiency    Recheck Vit D level today and continue daily supplement.      Relevant Orders   Vit D  25 hydroxy (rtn osteoporosis monitoring)    Other Visit Diagnoses    Annual physical exam    -  Primary   Annual physical labs to include CBC, CMP, lipid, and mag level (due to daily mag supplement)   Relevant Orders   CBC with Differential/Platelet out   Lipid Panel w/o Chol/HDL Ratio out       Follow up plan: Return in about 1 year (around 02/13/2020) for Annual physical.   LABORATORY TESTING:  - Pap smear: not applicable   IMMUNIZATIONS:   - Tdap: Tetanus vaccination status reviewed: last tetanus booster within 10 years. - Influenza: Up to date - Pneumovax: Not applicable - Prevnar: Not applicable - HPV: Not applicable - Zostavax vaccine: ordered  SCREENING: -Mammogram: Ordered today  - Colonoscopy: Up to date  - Bone Density: Not applicable  -Hearing Test: Not applicable  -Spirometry: Not applicable   PATIENT COUNSELING:   Advised to take 1 mg of folate supplement per day if capable of pregnancy.   Sexuality: Discussed sexually transmitted diseases, partner selection, use of condoms, avoidance of unintended pregnancy  and contraceptive alternatives.   Advised to avoid cigarette smoking.  I discussed with the patient that most people either abstain from alcohol or drink within safe limits (<=14/week and <=4 drinks/occasion for males, <=7/weeks and <= 3 drinks/occasion for females) and that the risk for alcohol disorders and other health effects rises proportionally with the number of drinks per week and how often a drinker exceeds daily limits.  Discussed cessation/primary prevention of drug use and availability of treatment for abuse.   Diet: Encouraged to adjust caloric intake to maintain  or achieve ideal body weight, to reduce intake of dietary saturated fat and total fat, to limit sodium intake by avoiding high sodium foods and not adding  table salt, and to maintain adequate dietary potassium and calcium preferably from fresh fruits, vegetables, and low-fat dairy products.    stressed the importance of regular exercise  Injury prevention: Discussed safety belts, safety helmets, smoke detector, smoking near bedding or upholstery.   Dental health: Discussed importance of regular tooth brushing, flossing, and dental visits.    NEXT PREVENTATIVE PHYSICAL DUE IN 1 YEAR. Return in about 1 year (around 02/13/2020) for Annual physical.

## 2019-02-14 LAB — COMPREHENSIVE METABOLIC PANEL
ALT: 15 IU/L (ref 0–32)
AST: 15 IU/L (ref 0–40)
Albumin/Globulin Ratio: 1.6 (ref 1.2–2.2)
Albumin: 3.9 g/dL (ref 3.8–4.9)
Alkaline Phosphatase: 91 IU/L (ref 39–117)
BUN/Creatinine Ratio: 13 (ref 9–23)
BUN: 10 mg/dL (ref 6–24)
Bilirubin Total: 0.2 mg/dL (ref 0.0–1.2)
CO2: 23 mmol/L (ref 20–29)
Calcium: 9.5 mg/dL (ref 8.7–10.2)
Chloride: 103 mmol/L (ref 96–106)
Creatinine, Ser: 0.77 mg/dL (ref 0.57–1.00)
GFR calc Af Amer: 98 mL/min/{1.73_m2} (ref >59)
GFR calc non Af Amer: 85 mL/min/{1.73_m2} (ref >59)
Globulin, Total: 2.4 g/dL (ref 1.5–4.5)
Glucose: 81 mg/dL (ref 65–99)
Potassium: 4.3 mmol/L (ref 3.5–5.2)
Sodium: 140 mmol/L (ref 134–144)
Total Protein: 6.3 g/dL (ref 6.0–8.5)

## 2019-02-14 LAB — MAGNESIUM: Magnesium: 1.9 mg/dL (ref 1.6–2.3)

## 2019-02-14 LAB — CBC WITH DIFFERENTIAL/PLATELET
Basophils Absolute: 0.1 10*3/uL (ref 0.0–0.2)
Basos: 1 %
EOS (ABSOLUTE): 0.1 10*3/uL (ref 0.0–0.4)
Eos: 1 %
Hematocrit: 40.1 % (ref 34.0–46.6)
Hemoglobin: 13.5 g/dL (ref 11.1–15.9)
Immature Grans (Abs): 0 10*3/uL (ref 0.0–0.1)
Immature Granulocytes: 0 %
Lymphocytes Absolute: 2.8 10*3/uL (ref 0.7–3.1)
Lymphs: 30 %
MCH: 27.4 pg (ref 26.6–33.0)
MCHC: 33.7 g/dL (ref 31.5–35.7)
MCV: 81 fL (ref 79–97)
Monocytes Absolute: 0.6 10*3/uL (ref 0.1–0.9)
Monocytes: 7 %
Neutrophils Absolute: 5.5 10*3/uL (ref 1.4–7.0)
Neutrophils: 61 %
Platelets: 347 10*3/uL (ref 150–450)
RBC: 4.93 x10E6/uL (ref 3.77–5.28)
RDW: 14.2 % (ref 11.7–15.4)
WBC: 9.1 10*3/uL (ref 3.4–10.8)

## 2019-02-14 LAB — VITAMIN D 25 HYDROXY (VIT D DEFICIENCY, FRACTURES): Vit D, 25-Hydroxy: 24.1 ng/mL — ABNORMAL LOW (ref 30.0–100.0)

## 2019-02-14 LAB — LIPID PANEL W/O CHOL/HDL RATIO
Cholesterol, Total: 252 mg/dL — ABNORMAL HIGH (ref 100–199)
HDL: 53 mg/dL (ref >39)
LDL Chol Calc (NIH): 177 mg/dL — ABNORMAL HIGH (ref 0–99)
Triglycerides: 123 mg/dL (ref 0–149)
VLDL Cholesterol Cal: 22 mg/dL (ref 5–40)

## 2019-05-29 ENCOUNTER — Ambulatory Visit
Admission: RE | Admit: 2019-05-29 | Discharge: 2019-05-29 | Disposition: A | Discharge status: Home / Self Care | Payer: 59 | Source: Ambulatory Visit | Attending: Nurse Practitioner | Admitting: Nurse Practitioner

## 2019-05-29 DIAGNOSIS — Z1231 Encounter for screening mammogram for malignant neoplasm of breast: Secondary | ICD-10-CM | POA: Diagnosis not present

## 2019-05-29 NOTE — Progress Notes (Signed)
Contacted via MyChart

## 2020-02-15 ENCOUNTER — Encounter: Payer: 59 | Admitting: Nurse Practitioner

## 2020-02-19 ENCOUNTER — Other Ambulatory Visit: Payer: Self-pay

## 2020-02-19 ENCOUNTER — Encounter: Payer: Self-pay | Admitting: Nurse Practitioner

## 2020-02-19 ENCOUNTER — Ambulatory Visit (INDEPENDENT_AMBULATORY_CARE_PROVIDER_SITE_OTHER): Payer: 59 | Admitting: Nurse Practitioner

## 2020-02-19 DIAGNOSIS — Z1231 Encounter for screening mammogram for malignant neoplasm of breast: Secondary | ICD-10-CM

## 2020-02-19 DIAGNOSIS — G43909 Migraine, unspecified, not intractable, without status migrainosus: Secondary | ICD-10-CM

## 2020-02-19 DIAGNOSIS — E559 Vitamin D deficiency, unspecified: Secondary | ICD-10-CM | POA: Diagnosis not present

## 2020-02-19 DIAGNOSIS — Z13220 Encounter for screening for lipoid disorders: Secondary | ICD-10-CM

## 2020-02-19 DIAGNOSIS — Z0000 Encounter for general adult medical examination without abnormal findings: Secondary | ICD-10-CM

## 2020-02-19 DIAGNOSIS — Z1159 Encounter for screening for other viral diseases: Secondary | ICD-10-CM | POA: Diagnosis not present

## 2020-02-19 DIAGNOSIS — E039 Hypothyroidism, unspecified: Secondary | ICD-10-CM

## 2020-02-19 NOTE — Patient Instructions (Signed)
Healthy Eating Following a healthy eating pattern may help you to achieve and maintain a healthy body weight, reduce the risk of chronic disease, and live a long and productive life. It is important to follow a healthy eating pattern at an appropriate calorie level for your body. Your nutritional needs should be met primarily through food by choosing a variety of nutrient-rich foods. What are tips for following this plan? Reading food labels  Read labels and choose the following: ? Reduced or low sodium. ? Juices with 100% fruit juice. ? Foods with low saturated fats and high polyunsaturated and monounsaturated fats. ? Foods with whole grains, such as whole wheat, cracked wheat, brown rice, and wild rice. ? Whole grains that are fortified with folic acid. This is recommended for women who are pregnant or who want to become pregnant.  Read labels and avoid the following: ? Foods with a lot of added sugars. These include foods that contain brown sugar, corn sweetener, corn syrup, dextrose, fructose, glucose, high-fructose corn syrup, honey, invert sugar, lactose, malt syrup, maltose, molasses, raw sugar, sucrose, trehalose, or turbinado sugar.  Do not eat more than the following amounts of added sugar per day:  6 teaspoons (25 g) for women.  9 teaspoons (38 g) for men. ? Foods that contain processed or refined starches and grains. ? Refined grain products, such as white flour, degermed cornmeal, white bread, and white rice. Shopping  Choose nutrient-rich snacks, such as vegetables, whole fruits, and nuts. Avoid high-calorie and high-sugar snacks, such as potato chips, fruit snacks, and candy.  Use oil-based dressings and spreads on foods instead of solid fats such as butter, stick margarine, or cream cheese.  Limit pre-made sauces, mixes, and "instant" products such as flavored rice, instant noodles, and ready-made pasta.  Try more plant-protein sources, such as tofu, tempeh, black beans,  edamame, lentils, nuts, and seeds.  Explore eating plans such as the Mediterranean diet or vegetarian diet. Cooking  Use oil to saut or stir-fry foods instead of solid fats such as butter, stick margarine, or lard.  Try baking, boiling, grilling, or broiling instead of frying.  Remove the fatty part of meats before cooking.  Steam vegetables in water or broth. Meal planning   At meals, imagine dividing your plate into fourths: ? One-half of your plate is fruits and vegetables. ? One-fourth of your plate is whole grains. ? One-fourth of your plate is protein, especially lean meats, poultry, eggs, tofu, beans, or nuts.  Include low-fat dairy as part of your daily diet. Lifestyle  Choose healthy options in all settings, including home, work, school, restaurants, or stores.  Prepare your food safely: ? Wash your hands after handling raw meats. ? Keep food preparation surfaces clean by regularly washing with hot, soapy water. ? Keep raw meats separate from ready-to-eat foods, such as fruits and vegetables. ? Cook seafood, meat, poultry, and eggs to the recommended internal temperature. ? Store foods at safe temperatures. In general:  Keep cold foods at 59F (4.4C) or below.  Keep hot foods at 159F (60C) or above.  Keep your freezer at [facility] (-17.8C) or below.  Foods are no longer safe to eat when they have been between the temperatures of 40-159F (4.4-60C) for more than 2 hours. What foods should I eat? Fruits Aim to eat 2 cup-equivalents of fresh, canned (in natural juice), or frozen fruits each day. Examples of 1 cup-equivalent of fruit include 1 small apple, 8 large strawberries, 1 cup canned fruit,  cup  dried fruit, or 1 cup 100% juice. Vegetables Aim to eat 2-3 cup-equivalents of fresh and frozen vegetables each day, including different varieties and colors. Examples of 1 cup-equivalent of vegetables include 2 medium carrots, 2 cups raw, leafy greens, 1 cup chopped  vegetable (raw or cooked), or 1 medium baked potato. Grains Aim to eat 6 ounce-equivalents of whole grains each day. Examples of 1 ounce-equivalent of grains include 1 slice of bread, 1 cup ready-to-eat cereal, 3 cups popcorn, or  cup cooked rice, pasta, or cereal. Meats and other proteins Aim to eat 5-6 ounce-equivalents of protein each day. Examples of 1 ounce-equivalent of protein include 1 egg, 1/2 cup nuts or seeds, or 1 tablespoon (16 g) peanut butter. A cut of meat or fish that is the size of a deck of cards is about 3-4 ounce-equivalents.  Of the protein you eat each week, try to have at least 8 ounces come from seafood. This includes salmon, trout, herring, and anchovies. Dairy Aim to eat 3 cup-equivalents of fat-free or low-fat dairy each day. Examples of 1 cup-equivalent of dairy include 1 cup (240 mL) milk, 8 ounces (250 g) yogurt, 1 ounces (44 g) natural cheese, or 1 cup (240 mL) fortified soy milk. Fats and oils  Aim for about 5 teaspoons (21 g) per day. Choose monounsaturated fats, such as canola and olive oils, avocados, peanut butter, and most nuts, or polyunsaturated fats, such as sunflower, corn, and soybean oils, walnuts, pine nuts, sesame seeds, sunflower seeds, and flaxseed. Beverages  Aim for six 8-oz glasses of water per day. Limit coffee to three to five 8-oz cups per day.  Limit caffeinated beverages that have added calories, such as soda and energy drinks.  Limit alcohol intake to no more than 1 drink a day for nonpregnant women and 2 drinks a day for men. One drink equals 12 oz of beer (355 mL), 5 oz of wine (148 mL), or 1 oz of hard liquor (44 mL). Seasoning and other foods  Avoid adding excess amounts of salt to your foods. Try flavoring foods with herbs and spices instead of salt.  Avoid adding sugar to foods.  Try using oil-based dressings, sauces, and spreads instead of solid fats. This information is based on general [place] nutrition guidelines. For more  information, visit [employer]. Exact amounts may vary based on your nutrition needs. Summary  A healthy eating plan may help you to maintain a healthy weight, reduce the risk of chronic diseases, and stay active throughout your life.  Plan your meals. Make sure you eat the right portions of a variety of nutrient-rich foods.  Try baking, boiling, grilling, or broiling instead of frying.  Choose healthy options in all settings, including home, work, school, restaurants, or stores. This information is not intended to replace advice given to you by your health care provider. Make sure you discuss any questions you have with your health care provider. Document Revised: 07/04/2017 Document Reviewed: 07/04/2017 Elsevier Patient Education  [place].

## 2020-02-19 NOTE — Progress Notes (Signed)
BP 131/82   Pulse 61   Temp 98.1 F (36.7 C)   Ht 5' 7.75" (1.721 m)   Wt (!) 335 lb (152 kg)   LMP 09/17/2013 (Approximate)   SpO2 98%   BMI 51.31 kg/m    Subjective:    Patient ID: Ann Robinson, female    DOB: [birth year], 59 y.o.   MRN: [id number]  HPI: Ann Robinson is a 59 y.o. female presenting on 02/19/2020 for comprehensive medical examination. Current medical complaints include:none  She currently lives with: significant other Menopausal Symptoms: no   HYPOTHYROIDISM Sees Dr. Gabriel Carina for this and takes Levothyroxine 137 MCG. Last saw Dr. Gabriel Carina on 01/07/20. Thyroid control status:stable Satisfied with current treatment? yes Medication side effects: no Medication compliance: good compliance Etiology of hypothyroidism:  Recent dose adjustment:yes Fatigue: no Cold intolerance: no Heat intolerance: no Weight gain: no Weight loss: no Constipation: no Diarrhea/loose stools: no Palpitations: no Lower extremity edema: no Anxiety/depressed mood: no   MIGRAINES: Has had none since going through menopause, went into menopause 7 years ago.  Functional Status Survey: Is the patient deaf or have difficulty hearing?: No Does the patient have difficulty seeing, even when wearing glasses/contacts?: No Does the patient have difficulty concentrating, remembering, or making decisions?: No Does the patient have difficulty walking or climbing stairs?: No Does the patient have difficulty dressing or bathing?: No Does the patient have difficulty doing errands alone such as visiting a doctor's office or shopping?: No  Depression Screen done today and results listed below:  Depression screen [facility] 2/9 02/19/2020 02/13/2019 09/28/2017 04/20/2017 04/23/2015  Decreased Interest 0 0 0 0 0  Down, Depressed, Hopeless 0 0 0 0 0  PHQ - 2 Score 0 0 0 0 0  Altered sleeping - 0 - - -  Tired, decreased energy - 1 - - -  Change in appetite - 1 - - -  Feeling bad or failure about  yourself  - 0 - - -  Trouble concentrating - 0 - - -  Moving slowly or fidgety/restless - 0 - - -  Suicidal thoughts - 0 - - -  PHQ-9 Score - 2 - - -  Difficult doing work/chores - Not difficult at all - - -   GAD 7 : Generalized Anxiety Score 02/13/2019  Nervous, Anxious, on Edge 0  Control/stop worrying 0  Worry too much - different things 0  Trouble relaxing 0  Restless 0  Easily annoyed or irritable 0  Afraid - awful might happen 0  Total GAD 7 Score 0    The patient does not have a history of falls. I did not complete a risk assessment for falls. A plan of care for falls was not documented.   Past Medical History:  Past Medical History:  Diagnosis Date  . Anemia    In the past  . Goiter   . History of kidney stones   . Migraines   . Nephrolithiasis 1988  . Thyroid disease     Surgical History:  Past Surgical History:  Procedure Laterality Date  . ABDOMINAL HYSTERECTOMY    . CHOLECYSTECTOMY    . COLONOSCOPY WITH PROPOFOL N/A 07/20/2017   Procedure: COLONOSCOPY WITH PROPOFOL;  Surgeon: Toledo, Benay Pike, MD;  Location: [facility] ENDOSCOPY;  Service: Gastroenterology;  Laterality: N/A;  . CYSTOSCOPY N/A 04/21/2016   Procedure: CYSTOSCOPY;  Surgeon: Gillis Ends, MD;  Location: [facility] ORS;  Service: Gynecology;  Laterality: N/A;  . ROBOTIC ASSISTED TOTAL HYSTERECTOMY WITH  BILATERAL SALPINGO OOPHERECTOMY Bilateral 04/21/2016   Procedure: ROBOTIC ASSISTED TOTAL HYSTERECTOMY WITH BILATERAL SALPINGO OOPHORECTOMY;  Surgeon: Gillis Ends, MD;  Location: [facility] ORS;  Service: Gynecology;  Laterality: Bilateral;  . SENTINEL NODE BIOPSY N/A 04/21/2016   Procedure: SENTINEL NODE BIOPSY;  Surgeon: Gillis Ends, MD;  Location: [facility] ORS;  Service: Gynecology;  Laterality: N/A;    Medications:  Current Outpatient Medications on File Prior to Visit  Medication Sig  . Biotin 1000 MCG tablet Take by mouth.   . Calcium-Magnesium-Vitamin D 300-150-400 MG-MG-UNIT  TABS Take by mouth.  . Cholecalciferol (VITAMIN D) 125 MCG (5000 UT) CAPS Take 1,000 Units by mouth 2 (two) times a week.   Marland Kitchen ibuprofen (ADVIL,MOTRIN) 600 MG tablet Take 1 tablet (600 mg total) by mouth every 6 (six) hours as needed for fever or headache.  . levothyroxine (SYNTHROID) 137 MCG tablet Take 1 tablet (137 mcg total) by mouth daily before breakfast.  . Magnesium Oxide 250 MG TABS Take 400 mg by mouth in the morning and at bedtime.   . Multiple Vitamin (MULTIVITAMIN WITH MINERALS) TABS tablet Take 1 tablet by mouth daily.  Marland Kitchen Phenylephrine HCl 5 MG TABS Take 10 mg by mouth as needed.    No current facility-administered medications on file prior to visit.    Allergies:  Allergies  Allergen Reactions  . Sulfa Antibiotics Rash    Social History:  Social History   Socioeconomic History  . Marital status: Widowed    Spouse name: Not on file  . Number of children: Not on file  . Years of education: Not on file  . Highest education level: Not on file  Occupational History  . Not on file  Tobacco Use  . Smoking status: Never Smoker  . Smokeless tobacco: Never Used  Vaping Use  . Vaping Use: Never used  Substance and Sexual Activity  . Alcohol use: Yes    Alcohol/week: 0.0 standard drinks    Comment: occasional  . Drug use: No  . Sexual activity: Yes  Other Topics Concern  . Not on file  Social History Narrative  . Not on file   Social Determinants of Health   Financial Resource Strain:   . Difficulty of Paying Living Expenses: Not on file  Food Insecurity:   . Worried About Charity fundraiser in the Last Year: Not on file  . Ran Out of Food in the Last Year: Not on file  Transportation Needs:   . Lack of Transportation (Medical): Not on file  . Lack of Transportation (Non-Medical): Not on file  Physical Activity:   . Days of Exercise per Week: Not on file  . Minutes of Exercise per Session: Not on file  Stress:   . Feeling of Stress : Not on file  Social  Connections:   . Frequency of Communication with Friends and Family: Not on file  . Frequency of Social Gatherings with Friends and Family: Not on file  . Attends Religious Services: Not on file  . Active Member of Clubs or Organizations: Not on file  . Attends Archivist Meetings: Not on file  . Marital Status: Not on file  Intimate Partner Violence:   . Fear of Current or Ex-Partner: Not on file  . Emotionally Abused: Not on file  . Physically Abused: Not on file  . Sexually Abused: Not on file   Social History   Tobacco Use  Smoking Status Never Smoker  Smokeless Tobacco Never Used  Social History   Substance and Sexual Activity  Alcohol Use Yes  . Alcohol/week: 0.0 standard drinks   Comment: occasional    Family History:  Family History  Problem Relation Age of Onset  . Cancer Mother        colon and breast  . Diabetes Mother   . Breast cancer Mother 44  . Alzheimer's disease Father   . Heart disease Father   . Hypertension Father   . Diabetes Father   . Diabetes Sister   . Hypertension Sister   . Diabetes Brother   . Liver disease Brother   . Heart disease Maternal Grandfather        MI  . Stroke Paternal Grandmother   . Thyroid disease Sister   . Diabetes Brother   . Breast cancer Maternal Aunt        late 47's early 35's  . Breast cancer Cousin 37    Past medical history, surgical history, medications, allergies, family history and social history reviewed with patient today and changes made to appropriate areas of the chart.   Review of Systems - negative All other ROS negative except what is listed above and in the HPI.      Objective:    BP 131/82   Pulse 61   Temp 98.1 F (36.7 C)   Ht 5' 7.75" (1.721 m)   Wt (!) 335 lb (152 kg)   LMP 09/17/2013 (Approximate)   SpO2 98%   BMI 51.31 kg/m   Wt Readings from Last 3 Encounters:  02/19/20 (!) 335 lb (152 kg)  02/13/19 (!) 335 lb (152 kg)  09/28/17 (!) 307 lb 6.4 oz (139.4 kg)     Physical Exam Constitutional:      General: She is awake. She is not in acute distress.    Appearance: She is well-developed. She is not ill-appearing.  HENT:     Head: Normocephalic and atraumatic.     Right Ear: Hearing, tympanic membrane, ear canal and external ear normal. No drainage.     Left Ear: Hearing, tympanic membrane, ear canal and external ear normal. No drainage.     Nose: Nose normal.     Right Sinus: No maxillary sinus tenderness or frontal sinus tenderness.     Left Sinus: No maxillary sinus tenderness or frontal sinus tenderness.     Mouth/Throat:     Mouth: Mucous membranes are moist.     Pharynx: Oropharynx is clear. Uvula midline. No pharyngeal swelling, oropharyngeal exudate or posterior oropharyngeal erythema.  Eyes:     General: Lids are normal.        Right eye: No discharge.        Left eye: No discharge.     Extraocular Movements: Extraocular movements intact.     Conjunctiva/sclera: Conjunctivae normal.     Pupils: Pupils are equal, round, and reactive to light.     Visual Fields: Right eye visual fields normal and left eye visual fields normal.  Neck:     Thyroid: No thyromegaly.     Vascular: No carotid bruit.     Trachea: Trachea normal.  Cardiovascular:     Rate and Rhythm: Normal rate and regular rhythm.     Heart sounds: Normal heart sounds. No murmur heard.  No gallop.   Pulmonary:     Effort: Pulmonary effort is normal. No accessory muscle usage or respiratory distress.     Breath sounds: Normal breath sounds.  Chest:     Breasts:  Right: Normal.        Left: Normal.  Abdominal:     General: Bowel sounds are normal.     Palpations: Abdomen is soft. There is no hepatomegaly or splenomegaly.     Tenderness: There is no abdominal tenderness.  Musculoskeletal:        General: Normal range of motion.     Cervical back: Normal range of motion and neck supple.     Right lower leg: No edema.     Left lower leg: No edema.    Lymphadenopathy:     Head:     Right side of head: No submental, submandibular, tonsillar, preauricular or posterior auricular adenopathy.     Left side of head: No submental, submandibular, tonsillar, preauricular or posterior auricular adenopathy.     Cervical: No cervical adenopathy.     Upper Body:     Right upper body: No supraclavicular, axillary or pectoral adenopathy.     Left upper body: No supraclavicular, axillary or pectoral adenopathy.  Skin:    General: Skin is warm and dry.     Capillary Refill: Capillary refill takes less than 2 seconds.     Findings: No rash.  Neurological:     Mental Status: She is alert and oriented to person, place, and time.     Cranial Nerves: Cranial nerves are intact.     Gait: Gait is intact.     Deep Tendon Reflexes: Reflexes are normal and symmetric.     Reflex Scores:      Brachioradialis reflexes are 2+ on the right side and 2+ on the left side.      Patellar reflexes are 2+ on the right side and 2+ on the left side. Psychiatric:        Attention and Perception: Attention normal.        Mood and Affect: Mood normal.        Speech: Speech normal.        Behavior: Behavior normal. Behavior is cooperative.        Thought Content: Thought content normal.        Judgment: Judgment normal.     Results for orders placed or performed in visit on 02/13/19  CBC with Differential/Platelet out  Result Value Ref Range   WBC 9.1 3.4 - 10.8 x10E3/uL   RBC 4.93 3.77 - 5.28 x10E6/uL   Hemoglobin 13.5 11.1 - 15.9 g/dL   Hematocrit 40.1 34.0 - 46.6 %   MCV 81 79 - 97 fL   MCH 27.4 26.6 - 33.0 pg   MCHC 33.7 31 - 35 g/dL   RDW 14.2 11.7 - 15.4 %   Platelets 347 150 - 450 x10E3/uL   Neutrophils 61 Not Estab. %   Lymphs 30 Not Estab. %   Monocytes 7 Not Estab. %   Eos 1 Not Estab. %   Basos 1 Not Estab. %   Neutrophils Absolute 5.5 1.40 - 7.00 x10E3/uL   Lymphocytes Absolute 2.8 0 - 3 x10E3/uL   Monocytes Absolute 0.6 0 - 0 x10E3/uL   EOS  (ABSOLUTE) 0.1 0.0 - 0.4 x10E3/uL   Basophils Absolute 0.1 0 - 0 x10E3/uL   Immature Granulocytes 0 Not Estab. %   Immature Grans (Abs) 0.0 0.0 - 0.1 x10E3/uL  Comprehensive metabolic panel  Result Value Ref Range   Glucose 81 65 - 99 mg/dL   BUN 10 6 - 24 mg/dL   Creatinine, Ser 0.77 0.57 - 1.00 mg/dL   GFR calc  non Af Amer 85 >59 mL/min/1.73   GFR calc Af Amer 98 >59 mL/min/1.73   BUN/Creatinine Ratio 13 9 - 23   Sodium 140 134 - 144 mmol/L   Potassium 4.3 3.5 - 5.2 mmol/L   Chloride 103 96 - 106 mmol/L   CO2 23 20 - 29 mmol/L   Calcium 9.5 8.7 - 10.2 mg/dL   Total Protein 6.3 6.0 - 8.5 g/dL   Albumin 3.9 3.8 - 4.9 g/dL   Globulin, Total 2.4 1.5 - 4.5 g/dL   Albumin/Globulin Ratio 1.6 1.2 - 2.2   Bilirubin Total 0.2 0.0 - 1.2 mg/dL   Alkaline Phosphatase 91 39 - 117 IU/L   AST 15 0 - 40 IU/L   ALT 15 0 - 32 IU/L  Lipid Panel w/o Chol/HDL Ratio out  Result Value Ref Range   Cholesterol, Total 252 (H) 100 - 199 mg/dL   Triglycerides 123 0 - 149 mg/dL   HDL 53 >39 mg/dL   VLDL Cholesterol Cal 22 5 - 40 mg/dL   LDL Chol Calc (NIH) 177 (H) 0 - 99 mg/dL  Magnesium  Result Value Ref Range   Magnesium 1.9 1.6 - 2.3 mg/dL  Vit D  25 hydroxy (rtn osteoporosis monitoring)  Result Value Ref Range   Vit D, 25-Hydroxy 24.1 (L) 30.0 - 100.0 ng/mL      Assessment & Plan:   Problem List Items Addressed This Visit      Cardiovascular and Mediastinum   Migraine    Improved, none in 7 years.  If continues to be migraine free will resolved this issue in upcoming years.        Endocrine   Hypothyroidism    Chronic, ongoing.  Followed by endocrinology.  Recommend continuing current dose and reviewed Dr. Joycie Peek recent note.  No thyroid labs today, are assessed with endo.          Other   Morbid obesity ([place]) - Primary    BMI 51.31.  Recommended eating smaller high protein, low fat meals more frequently and exercising 30 mins a day 5 times a week with a goal of 10-15lb weight loss  in the next 3 months. Patient voiced their understanding and motivation to adhere to these recommendations.       Vitamin D deficiency    Recheck Vit D level today and continue daily supplement.  DEXA at age 72.      Relevant Orders   VITAMIN D 25 Hydroxy (Vit-D Deficiency, Fractures)    Other Visit Diagnoses    Encounter for annual physical exam       Annual labs today to include CBC, CMP, lipid   Relevant Orders   CBC with Differential/Platelet   Comprehensive metabolic panel   Screening cholesterol level       Lipid panel today   Relevant Orders   Lipid Panel w/o Chol/HDL Ratio   Need for hepatitis C screening test       Patient requests repeat Hep C screening today   Relevant Orders   Hepatitis C antibody   Encounter for screening mammogram for malignant neoplasm of breast       Mammogram ordered for new year   Relevant Orders   MM DIGITAL SCREENING BILATERAL       Follow up plan: Return in about 1 year (around 02/18/2021) for Annual exam.   LABORATORY TESTING:  - Pap smear: not applicable  IMMUNIZATIONS:   - Tdap: Tetanus vaccination status reviewed: last tetanus booster within 10  years. - Influenza: Up to date - Pneumovax: Not applicable - Prevnar: Not applicable - HPV: Not applicable - Zostavax vaccine: ordered  SCREENING: -Mammogram: Up To Date -- February 2021 - Colonoscopy: Up to date  - Bone Density: Not applicable  -Hearing Test: Not applicable  -Spirometry: Not applicable   PATIENT COUNSELING:   Advised to take 1 mg of folate supplement per day if capable of pregnancy.   Sexuality: Discussed sexually transmitted diseases, partner selection, use of condoms, avoidance of unintended pregnancy  and contraceptive alternatives.   Advised to avoid cigarette smoking.  I discussed with the patient that most people either abstain from alcohol or drink within safe limits (<=14/week and <=4 drinks/occasion for males, <=7/weeks and <= 3 drinks/occasion for  females) and that the risk for alcohol disorders and other health effects rises proportionally with the number of drinks per week and how often a drinker exceeds daily limits.  Discussed cessation/primary prevention of drug use and availability of treatment for abuse.   Diet: Encouraged to adjust caloric intake to maintain  or achieve ideal body weight, to reduce intake of dietary saturated fat and total fat, to limit sodium intake by avoiding high sodium foods and not adding table salt, and to maintain adequate dietary potassium and calcium preferably from fresh fruits, vegetables, and low-fat dairy products.    Stressed the importance of regular exercise  Injury prevention: Discussed safety belts, safety helmets, smoke detector, smoking near bedding or upholstery.   Dental health: Discussed importance of regular tooth brushing, flossing, and dental visits.    NEXT PREVENTATIVE PHYSICAL DUE IN 1 YEAR. Return in about 1 year (around 02/18/2021) for Annual exam.

## 2020-02-19 NOTE — Assessment & Plan Note (Signed)
BMI 51.31.  Recommended eating smaller high protein, low fat meals more frequently and exercising 30 mins a day 5 times a week with a goal of 10-15lb weight loss in the next 3 months. Patient voiced their understanding and motivation to adhere to these recommendations.

## 2020-02-19 NOTE — Assessment & Plan Note (Signed)
Improved, none in 7 years.  If continues to be migraine free will resolved this issue in upcoming years.

## 2020-02-19 NOTE — Assessment & Plan Note (Signed)
Chronic, ongoing.  Followed by endocrinology.  Recommend continuing current dose and reviewed Dr. Joycie Peek recent note.  No thyroid labs today, are assessed with endo.

## 2020-02-19 NOTE — Assessment & Plan Note (Addendum)
Recheck Vit D level today and continue daily supplement.  DEXA at age 59. 

## 2020-02-20 ENCOUNTER — Other Ambulatory Visit: Payer: Self-pay | Admitting: Nurse Practitioner

## 2020-02-20 DIAGNOSIS — E7800 Pure hypercholesterolemia, unspecified: Secondary | ICD-10-CM

## 2020-02-20 DIAGNOSIS — E870 Hyperosmolality and hypernatremia: Secondary | ICD-10-CM

## 2020-02-20 DIAGNOSIS — R7309 Other abnormal glucose: Secondary | ICD-10-CM

## 2020-02-20 LAB — COMPREHENSIVE METABOLIC PANEL
ALT: 22 IU/L (ref 0–32)
AST: 16 IU/L (ref 0–40)
Albumin/Globulin Ratio: 1.6 (ref 1.2–2.2)
Albumin: 4 g/dL (ref 3.8–4.9)
Alkaline Phosphatase: 92 IU/L (ref 44–121)
BUN/Creatinine Ratio: 11 — ABNORMAL LOW (ref 12–28)
BUN: 7 mg/dL — ABNORMAL LOW (ref 8–27)
Bilirubin Total: <0.2 mg/dL (ref 0.0–1.2)
CO2: 26 mmol/L (ref 20–29)
Calcium: 9.7 mg/dL (ref 8.7–10.3)
Chloride: 107 mmol/L — ABNORMAL HIGH (ref 96–106)
Creatinine, Ser: 0.64 mg/dL (ref 0.57–1.00)
GFR calc Af Amer: 112 mL/min/{1.73_m2} (ref >59)
GFR calc non Af Amer: 97 mL/min/{1.73_m2} (ref >59)
Globulin, Total: 2.5 g/dL (ref 1.5–4.5)
Glucose: 100 mg/dL — ABNORMAL HIGH (ref 65–99)
Potassium: 4.4 mmol/L (ref 3.5–5.2)
Sodium: 145 mmol/L — ABNORMAL HIGH (ref 134–144)
Total Protein: 6.5 g/dL (ref 6.0–8.5)

## 2020-02-20 LAB — CBC WITH DIFFERENTIAL/PLATELET
Basophils Absolute: 0 10*3/uL (ref 0.0–0.2)
Basos: 1 %
EOS (ABSOLUTE): 0.1 10*3/uL (ref 0.0–0.4)
Eos: 2 %
Hematocrit: 42 % (ref 34.0–46.6)
Hemoglobin: 13.6 g/dL (ref 11.1–15.9)
Immature Grans (Abs): 0 10*3/uL (ref 0.0–0.1)
Immature Granulocytes: 0 %
Lymphocytes Absolute: 2.6 10*3/uL (ref 0.7–3.1)
Lymphs: 35 %
MCH: 27.1 pg (ref 26.6–33.0)
MCHC: 32.4 g/dL (ref 31.5–35.7)
MCV: 84 fL (ref 79–97)
Monocytes Absolute: 0.4 10*3/uL (ref 0.1–0.9)
Monocytes: 6 %
Neutrophils Absolute: 4.4 10*3/uL (ref 1.4–7.0)
Neutrophils: 56 %
Platelets: 336 10*3/uL (ref 150–450)
RBC: 5.01 x10E6/uL (ref 3.77–5.28)
RDW: 14.5 % (ref 11.7–15.4)
WBC: 7.6 10*3/uL (ref 3.4–10.8)

## 2020-02-20 LAB — HEPATITIS C ANTIBODY: Hep C Virus Ab: <0.1 s/co ratio (ref 0.0–0.9)

## 2020-02-20 LAB — LIPID PANEL W/O CHOL/HDL RATIO
Cholesterol, Total: 314 mg/dL — ABNORMAL HIGH (ref 100–199)
HDL: 54 mg/dL (ref >39)
LDL Chol Calc (NIH): 238 mg/dL — ABNORMAL HIGH (ref 0–99)
Triglycerides: 124 mg/dL (ref 0–149)
VLDL Cholesterol Cal: 22 mg/dL (ref 5–40)

## 2020-02-20 LAB — VITAMIN D 25 HYDROXY (VIT D DEFICIENCY, FRACTURES): Vit D, 25-Hydroxy: 26.8 ng/mL — ABNORMAL LOW (ref 30.0–100.0)

## 2020-02-20 NOTE — Progress Notes (Signed)
Recheck cholesterol and sodium

## 2020-02-20 NOTE — Progress Notes (Signed)
Contacted via MyChart The 10-year ASCVD risk score Mikey Bussing DC Jr., et al., 2013) is: 4.9%   Values used to calculate the score:     Age: 59 years     Sex: Female     Is Non-Hispanic African American: No     Diabetic: No     Tobacco smoker: No     Systolic Blood Pressure: 859 mmHg     Is BP treated: No     HDL Cholesterol: 54 mg/dL     Total Cholesterol: 314 mg/dL  Good afternoon Ms. Reitano, your labs have returned: - CBC is normal and Hep C negative - Vitamin D level remains a little low, I would increase your Vitamin D3 to 4000 units daily -- it looks like right now you take 1000 units twice a day. - Sodium (salt) level was a little high as was glucose (sugar), I recommend increasing water intake during daytime and decreasing salt intake.  Will recheck next visit. - Your cholesterol levels were quite elevated.  How long had you been fasting? At these levels I would recommend statin, but it may be good to have you come back in 4 weeks in the morning for a lab only visit and recheck fully fasting levels first.  Would you be okay with this?  If so I will order and ask you schedule lab visit only for 4 weeks.  Any questions? Keep being awesome!!  Thank you for allowing me to participate in your care. Kindest regards, Sondi Desch

## 2020-04-07 ENCOUNTER — Other Ambulatory Visit: Payer: 59

## 2020-04-07 ENCOUNTER — Other Ambulatory Visit: Payer: Self-pay

## 2020-04-07 DIAGNOSIS — E7800 Pure hypercholesterolemia, unspecified: Secondary | ICD-10-CM

## 2020-04-07 DIAGNOSIS — E870 Hyperosmolality and hypernatremia: Secondary | ICD-10-CM

## 2020-04-07 DIAGNOSIS — R7309 Other abnormal glucose: Secondary | ICD-10-CM

## 2020-04-08 LAB — LIPID PANEL W/O CHOL/HDL RATIO
Cholesterol, Total: 265 mg/dL — ABNORMAL HIGH (ref 100–199)
HDL: 52 mg/dL (ref >39)
LDL Chol Calc (NIH): 198 mg/dL — ABNORMAL HIGH (ref 0–99)
Triglycerides: 86 mg/dL (ref 0–149)
VLDL Cholesterol Cal: 15 mg/dL (ref 5–40)

## 2020-04-08 LAB — BASIC METABOLIC PANEL
BUN/Creatinine Ratio: 9 — ABNORMAL LOW (ref 12–28)
BUN: 7 mg/dL — ABNORMAL LOW (ref 8–27)
CO2: 26 mmol/L (ref 20–29)
Calcium: 9.2 mg/dL (ref 8.7–10.3)
Chloride: 101 mmol/L (ref 96–106)
Creatinine, Ser: 0.81 mg/dL (ref 0.57–1.00)
GFR calc Af Amer: 91 mL/min/{1.73_m2} (ref >59)
GFR calc non Af Amer: 79 mL/min/{1.73_m2} (ref >59)
Glucose: 83 mg/dL (ref 65–99)
Potassium: 4 mmol/L (ref 3.5–5.2)
Sodium: 140 mmol/L (ref 134–144)

## 2020-04-08 LAB — HEMOGLOBIN A1C
Est. average glucose Bld gHb Est-mCnc: 117 mg/dL
Hgb A1c MFr Bld: 5.7 % — ABNORMAL HIGH (ref 4.8–5.6)

## 2020-04-08 NOTE — Progress Notes (Signed)
Contacted via MyChart The 10-year ASCVD risk score Denman George DC Jr., et al., 2013) is: 4.5%   Values used to calculate the score:     Age: 60 years     Sex: Female     Is Non-Hispanic African American: No     Diabetic: No     Tobacco smoker: No     Systolic Blood Pressure: 131 mmHg     Is BP treated: No     HDL Cholesterol: 52 mg/dL     Total Cholesterol: 265 mg/dL   Good afternoon Ann Robinson.  You labs have returned.  Your cholesterol levels have come down, but are still quite elevated.  I do recommend starting a cholesterol medication to help in preventing stroke.  I would like to see LDL less than 70 for stroke prevention and your level is 198.  Would you like to try medication? Sodium level has improved.  The A1C is the diabetes testing we talked about, this looks at your blood sugars over the past 3 months and turns the average into a number.  Your number is 5.7%, meaning you are prediabetic.  Any number 5.7 to 6.4 is considered prediabetes and any number 6.5 or greater is considered diabetes.   I would recommend heavy focus on decreasing foods high in sugar and your intake of things like bread products, pasta, and rice.  The American Diabetes Association online has a large amount of information on diet changes to make.  We will recheck this number next visit to ensure you are not continuing to trend upwards and move into diabetes.  Have a good day. Keep being awesome!!  Thank you for allowing me to participate in your care. Kindest regards, Daysy Santini

## 2020-04-14 ENCOUNTER — Emergency Department
Admission: EM | Admit: 2020-04-14 | Discharge: 2020-04-14 | Disposition: A | Discharge status: Home / Self Care | Payer: 59 | Attending: Emergency Medicine | Admitting: Emergency Medicine

## 2020-04-14 ENCOUNTER — Encounter: Payer: Self-pay | Admitting: Emergency Medicine

## 2020-04-14 ENCOUNTER — Emergency Department: Payer: 59

## 2020-04-14 ENCOUNTER — Ambulatory Visit: Payer: Self-pay | Admitting: *Deleted

## 2020-04-14 ENCOUNTER — Other Ambulatory Visit: Payer: Self-pay

## 2020-04-14 DIAGNOSIS — E039 Hypothyroidism, unspecified: Secondary | ICD-10-CM | POA: Diagnosis not present

## 2020-04-14 DIAGNOSIS — Z79899 Other long term (current) drug therapy: Secondary | ICD-10-CM | POA: Insufficient documentation

## 2020-04-14 DIAGNOSIS — R079 Chest pain, unspecified: Secondary | ICD-10-CM

## 2020-04-14 LAB — CBC
HCT: 42.4 % (ref 36.0–46.0)
Hemoglobin: 13.9 g/dL (ref 12.0–15.0)
MCH: 27.9 pg (ref 26.0–34.0)
MCHC: 32.8 g/dL (ref 30.0–36.0)
MCV: 85 fL (ref 80.0–100.0)
Platelets: 322 10*3/uL (ref 150–400)
RBC: 4.99 MIL/uL (ref 3.87–5.11)
RDW: 14.6 % (ref 11.5–15.5)
WBC: 8.2 10*3/uL (ref 4.0–10.5)
nRBC: 0 % (ref 0.0–0.2)

## 2020-04-14 LAB — BASIC METABOLIC PANEL
Anion gap: 10 (ref 5–15)
BUN: 13 mg/dL (ref 6–20)
CO2: 22 mmol/L (ref 22–32)
Calcium: 8.9 mg/dL (ref 8.9–10.3)
Chloride: 106 mmol/L (ref 98–111)
Creatinine, Ser: 0.58 mg/dL (ref 0.44–1.00)
GFR, Estimated: >60 mL/min (ref >60)
Glucose, Bld: 115 mg/dL — ABNORMAL HIGH (ref 70–99)
Potassium: 3.8 mmol/L (ref 3.5–5.1)
Sodium: 138 mmol/L (ref 135–145)

## 2020-04-14 LAB — TROPONIN I (HIGH SENSITIVITY): Troponin I (High Sensitivity): 3 ng/L (ref <18)

## 2020-04-14 NOTE — ED Triage Notes (Signed)
Pt to ED via [facility] with c/o CP that started of Friday, states started substernal then went behind L breast and moved to the back. Pt denies N/V/D/SOB/lightheaded/dizziness. Pt states pain worse if she moves L arm or lays on L side.

## 2020-04-14 NOTE — Telephone Encounter (Signed)
Noted and agree with plan of care for ER, further assessment needed.  Has some risk factors for cardiac event.

## 2020-04-14 NOTE — ED Triage Notes (Signed)
First Nurse Note:  Arrives via EMS.  Per report on Friday while working, c/O left chest pain that radiated down left side.  Per report, patient had taken 325 mg ASA and CP has gradually improved over the weekend.  Patient denies all complaints currently.  No CP.  Had called PCP office who instructed her to call 911 and present to the ED for evaluation.  VS wnl.  NAD

## 2020-04-14 NOTE — ED Provider Notes (Signed)
[facility] Emergency Department Provider Note  Time seen: 11:59 AM  I have reviewed the triage vital signs and the nursing notes.   HISTORY  Chief Complaint Chest Pain   HPI Ann Robinson is a 60 y.o. female with a past medical history of anemia, hypothyroidism, presents to the emergency department for left chest pain.  According to the patient since Friday she has been experiencing pain in her left chest.  States it has improved over the weekend but not entirely resolved so the patient came to the emergency department for evaluation.  Denies any pleuritic pain.  Denies any shortness of breath cough or fever.  States the pain is worse if she pushes on the chest moves her left shoulder or sleeps on her left side.  Denies any leg pain.  No history of cardiac disease.  States minimal discomfort in her left chest currently mostly if she pushes on it.   Past Medical History:  Diagnosis Date  . Anemia    In the past  . Goiter   . History of kidney stones   . Migraines   . Nephrolithiasis 1988  . Thyroid disease     Patient Active Problem List   Diagnosis Date Noted  . Vitamin D deficiency 02/13/2019  . History of robot-assisted laparoscopic hysterectomy 04/21/2016  . Polyp of colon 11/18/2014  . Hypothyroidism 11/18/2014  . Migraine 11/18/2014  . Morbid obesity ([place]) 11/18/2014    Past Surgical History:  Procedure Laterality Date  . ABDOMINAL HYSTERECTOMY    . CHOLECYSTECTOMY    . COLONOSCOPY WITH PROPOFOL N/A 07/20/2017   Procedure: COLONOSCOPY WITH PROPOFOL;  Surgeon: Toledo, Benay Pike, MD;  Location: [facility] ENDOSCOPY;  Service: Gastroenterology;  Laterality: N/A;  . CYSTOSCOPY N/A 04/21/2016   Procedure: CYSTOSCOPY;  Surgeon: Gillis Ends, MD;  Location: [facility] ORS;  Service: Gynecology;  Laterality: N/A;  . ROBOTIC ASSISTED TOTAL HYSTERECTOMY WITH BILATERAL SALPINGO OOPHERECTOMY Bilateral 04/21/2016   Procedure: ROBOTIC ASSISTED TOTAL  HYSTERECTOMY WITH BILATERAL SALPINGO OOPHORECTOMY;  Surgeon: Gillis Ends, MD;  Location: [facility] ORS;  Service: Gynecology;  Laterality: Bilateral;  . SENTINEL NODE BIOPSY N/A 04/21/2016   Procedure: SENTINEL NODE BIOPSY;  Surgeon: Gillis Ends, MD;  Location: [facility] ORS;  Service: Gynecology;  Laterality: N/A;    Prior to Admission medications   Medication Sig Start Date End Date Taking? Authorizing Provider  Biotin 1000 MCG tablet Take by mouth.     [provider]  Calcium-Magnesium-Vitamin D 300-150-400 MG-MG-UNIT TABS Take by mouth.    [provider]  Cholecalciferol (VITAMIN D) 125 MCG (5000 UT) CAPS Take 1,000 Units by mouth 2 (two) times a week.     [provider]  ibuprofen (ADVIL,MOTRIN) 600 MG tablet Take 1 tablet (600 mg total) by mouth every 6 (six) hours as needed for fever or headache. 04/22/16   Malachy Mood, MD  levothyroxine (SYNTHROID) 137 MCG tablet Take 1 tablet (137 mcg total) by mouth daily before breakfast. 02/13/19   Cannady, Henrine Screws T, NP  Magnesium Oxide 250 MG TABS Take 400 mg by mouth in the morning and at bedtime.     [provider]  Multiple Vitamin (MULTIVITAMIN WITH MINERALS) TABS tablet Take 1 tablet by mouth daily.    [provider]  Phenylephrine HCl 5 MG TABS Take 10 mg by mouth as needed.     [provider]    Allergies  Allergen Reactions  . Sulfa Antibiotics Rash    Family  History  Problem Relation Age of Onset  . Cancer Mother        colon and breast  . Diabetes Mother   . Breast cancer Mother 58  . Alzheimer's disease Father   . Heart disease Father   . Hypertension Father   . Diabetes Father   . Diabetes Sister   . Hypertension Sister   . Diabetes Brother   . Liver disease Brother   . Heart disease Maternal Grandfather        MI  . Stroke Paternal Grandmother   . Thyroid disease Sister   . Diabetes Brother   . Breast cancer Maternal Aunt        late 21's  early 24's  . Breast cancer Cousin 8    Social History Social History   Tobacco Use  . Smoking status: Never Smoker  . Smokeless tobacco: Never Used  Vaping Use  . Vaping Use: Never used  Substance Use Topics  . Alcohol use: Yes    Alcohol/week: 0.0 standard drinks    Comment: occasional  . Drug use: No    Review of Systems Constitutional: Negative for fever. Cardiovascular: Mild left chest pain x3 days Respiratory: Negative for shortness of breath. Gastrointestinal: Negative for abdominal pain, vomiting and diarrhea. Musculoskeletal: Negative for musculoskeletal complaints Neurological: Negative for headache All other ROS negative  ____________________________________________   PHYSICAL EXAM:  VITAL SIGNS: ED Triage Vitals  Enc Vitals Group     BP 04/14/20 1008 (!) 143/57     Pulse Rate 04/14/20 1008 70     Resp 04/14/20 1008 20     Temp 04/14/20 1008 97.7 F (36.5 C)     Temp Source 04/14/20 1008 Oral     SpO2 04/14/20 1008 98 %     Weight 04/14/20 1007 (!) 335 lb 1.6 oz (152 kg)     Height 04/14/20 1007 5' 7.75" (1.721 m)     Head Circumference --      Peak Flow --      Pain Score 04/14/20 1006 0     Pain Loc --      Pain Edu? --      Excl. in [place]? --    Constitutional: Alert and oriented. Well appearing and in no distress. Eyes: Normal exam ENT      Head: Normocephalic and atraumatic.      Mouth/Throat: Mucous membranes are moist. Cardiovascular: Normal rate, regular rhythm. No murmur.  Mild chest wall tenderness palpation over the left chest. Respiratory: Normal respiratory effort without tachypnea nor retractions. Breath sounds are clear Gastrointestinal: Soft and nontender. No distention.   Musculoskeletal: Nontender with normal range of motion in all extremities.  Neurologic:  Normal speech and language. No gross focal neurologic deficits Skin:  Skin is warm, dry and intact.  Psychiatric: Mood and affect are normal.    ____________________________________________    EKG  EKG viewed and interpreted by myself shows a normal sinus rhythm at 60 bpm with a narrow QRS, normal axis, normal intervals, no concerning ST changes.  ____________________________________________    RADIOLOGY  Chest x-ray is clear  ____________________________________________   INITIAL IMPRESSION / ASSESSMENT AND PLAN / ED COURSE  Pertinent labs & imaging results that were available during my care of the patient were reviewed by me and considered in my medical decision making (see chart for details).   Patient presents emergency department for left chest pain over the past 3 days.  Patient does have reproducible chest pain on evaluation.  In speaking to the patient further she states she started a new stretching routine Friday morning when she was attempting a push-up.  Given the pain is reproducible with a normal work-up including negative troponin and normal EKG and reassuring chest x-ray I believe the patient is safe for discharge home.  However given her age I did discuss follow-up with cardiology for a stress test.  Patient agreeable to plan of care.  Discussed my typical chest pain return precautions.  Ann Robinson was evaluated in Emergency Department on 04/14/2020 for the symptoms described in the history of present illness. She was evaluated in the context of the global COVID-19 pandemic, which necessitated consideration that the patient might be at risk for infection with the SARS-CoV-2 virus that causes COVID-19. Institutional protocols and algorithms that pertain to the evaluation of patients at risk for COVID-19 are in a state of rapid change based on information released by regulatory bodies including the CDC and federal and state organizations. These policies and algorithms were followed during the patient's care in the ED.  ____________________________________________   FINAL CLINICAL IMPRESSION(S) / ED  DIAGNOSES  Chest pain   Harvest Dark, MD 04/14/20 1201

## 2020-04-14 NOTE — Telephone Encounter (Signed)
Pt called in c/o chest pain that started Friday afternoon while working at her computer .  It started in the center of her chest and an hour later radiated under her left breast and into upper back.   She is also having discomfort down her left arm.  Denies shortness of breath.   "I've spent all weekend in bed".    No cardiac history.   Denies shortness of breath, sweating, had some nausea after taking extra strength aspirin but not other wise.   She took the aspirin on advice of a friend.  I instructed her to call 911.   She was agreeable.   She lives alone.   I let her know she did not need to drive with chest pain she could pass out.   Also they can start treatment in the ambulance.   She is calling 911.  Reason for Disposition . [1] Chest pain lasts > 5 minutes AND [2] age > 82  Answer Assessment - Initial Assessment Questions 1. LOCATION: "Where does it hurt?"       Started Fri afternoon in center of my chest and to the left then it got worse an hr later under my left breast and into my upper back.   I've been in bed all weekend.   No shortness of breath. 2. RADIATION: "Does the pain go anywhere else?" (e.g., into neck, jaw, arms, back)     Yes under left breast and into upper back. 3. ONSET: "When did the chest pain begin?" (Minutes, hours or days)      Friday afternoon 4. PATTERN "Does the pain come and go, or has it been constant since it started?"  "Does it get worse with exertion?"      It's constant dull pain.  My left arm hurts. 5. DURATION: "How long does it last" (e.g., seconds, minutes, hours)     All weekend. 6. SEVERITY: "How bad is the pain?"  (e.g., Scale 1-10; mild, moderate, or severe)    - MILD (1-3): doesn't interfere with normal activities     - MODERATE (4-7): interferes with normal activities or awakens from sleep    - SEVERE (8-10): excruciating pain, unable to do any normal activities       Fri 8 on pain scale.  It hurt really bad.  I was working at the computer.    The pain has been less but this morning it's bothering me 2 on pain scale.   I'm taking extra strength aspirin. 7. CARDIAC RISK FACTORS: "Do you have any history of heart problems or risk factors for heart disease?" (e.g., angina, prior heart attack; diabetes, high blood pressure, high cholesterol, smoker, or strong family history of heart disease)     No cardiac history.  No BP problems.  Elevated cholesterol.   Never smoker 8. PULMONARY RISK FACTORS: "Do you have any history of lung disease?"  (e.g., blood clots in lung, asthma, emphysema, birth control pills)     No lung problems 9. CAUSE: "What do you think is causing the chest pain?"     Maybe my heart or indigestion.   10. OTHER SYMPTOMS: "Do you have any other symptoms?" (e.g., dizziness, nausea, vomiting, sweating, fever, difficulty breathing, cough)       Denies nausea except after aspirin.  No dizziness or lightheadedness.   No recent illness. 11. PREGNANCY: "Is there any chance you are pregnant?" "When was your last menstrual period?"       Not asked due  to age  Protocols used: CHEST PAIN-A-AH

## 2020-04-18 ENCOUNTER — Other Ambulatory Visit: Payer: Self-pay | Admitting: Nurse Practitioner

## 2020-04-18 MED ORDER — ATORVASTATIN CALCIUM 10 MG PO TABS: 10.0000 mg | ORAL_TABLET | Freq: Every day | ORAL | 0 refills | Status: DC

## 2020-04-18 MED ORDER — ATORVASTATIN CALCIUM 10 MG PO TABS: 10.0000 mg | ORAL_TABLET | Freq: Every day | ORAL | 3 refills | Status: DC

## 2020-08-22 DIAGNOSIS — Z1211 Encounter for screening for malignant neoplasm of colon: Secondary | ICD-10-CM

## 2020-08-26 ENCOUNTER — Other Ambulatory Visit: Payer: Self-pay | Admitting: Nurse Practitioner

## 2020-08-26 MED ORDER — ROSUVASTATIN CALCIUM 10 MG PO TABS: 10.0000 mg | ORAL_TABLET | Freq: Every day | ORAL | 3 refills | Status: DC

## 2020-09-03 ENCOUNTER — Other Ambulatory Visit: Payer: Self-pay

## 2020-09-03 DIAGNOSIS — Z1211 Encounter for screening for malignant neoplasm of colon: Secondary | ICD-10-CM

## 2020-09-03 MED ORDER — SUPREP BOWEL PREP KIT 17.5-3.13-1.6 GM/177ML PO SOLN: 1.0000 | ORAL | 0 refills | Status: DC

## 2020-09-15 ENCOUNTER — Other Ambulatory Visit: Payer: Self-pay

## 2020-09-15 MED ORDER — ROSUVASTATIN CALCIUM 10 MG PO TABS
10.0000 mg | ORAL_TABLET | Freq: Every day | ORAL | 4 refills | Status: DC
Start: 2020-09-15 — End: 2021-10-20

## 2020-09-15 NOTE — Telephone Encounter (Signed)
Patient would like medication to be sent to [facility] due to insurance not allowing her to have her prescription filled for 90 days at [facility]

## 2020-09-26 ENCOUNTER — Other Ambulatory Visit: Payer: Self-pay

## 2020-09-26 ENCOUNTER — Ambulatory Visit: Payer: 59 | Admitting: Anesthesiology

## 2020-09-26 ENCOUNTER — Encounter: Payer: Self-pay | Admitting: Gastroenterology

## 2020-09-26 ENCOUNTER — Ambulatory Visit
Admission: RE | Admit: 2020-09-26 | Discharge: 2020-09-26 | Disposition: A | Discharge status: Home / Self Care | Payer: 59 | Attending: Gastroenterology | Admitting: Gastroenterology

## 2020-09-26 ENCOUNTER — Encounter
Admission: RE | Disposition: A | Discharge status: Home / Self Care | Payer: Self-pay | Source: Home / Self Care | Attending: Gastroenterology

## 2020-09-26 DIAGNOSIS — Z882 Allergy status to sulfonamides status: Secondary | ICD-10-CM | POA: Diagnosis not present

## 2020-09-26 DIAGNOSIS — D124 Benign neoplasm of descending colon: Secondary | ICD-10-CM | POA: Insufficient documentation

## 2020-09-26 DIAGNOSIS — Z1211 Encounter for screening for malignant neoplasm of colon: Secondary | ICD-10-CM | POA: Insufficient documentation

## 2020-09-26 DIAGNOSIS — Z79890 Hormone replacement therapy (postmenopausal): Secondary | ICD-10-CM | POA: Diagnosis not present

## 2020-09-26 DIAGNOSIS — Z79899 Other long term (current) drug therapy: Secondary | ICD-10-CM | POA: Insufficient documentation

## 2020-09-26 DIAGNOSIS — Z860100 Personal history of colon polyps, unspecified: Secondary | ICD-10-CM

## 2020-09-26 DIAGNOSIS — D120 Benign neoplasm of cecum: Secondary | ICD-10-CM | POA: Insufficient documentation

## 2020-09-26 DIAGNOSIS — K635 Polyp of colon: Secondary | ICD-10-CM | POA: Diagnosis not present

## 2020-09-26 HISTORY — PX: COLONOSCOPY WITH PROPOFOL: SHX5780

## 2020-09-26 SURGERY — COLONOSCOPY WITH PROPOFOL
Anesthesia: General

## 2020-09-26 MED ORDER — SODIUM CHLORIDE 0.9 % IV SOLN: INTRAVENOUS | Status: DC

## 2020-09-26 MED ORDER — LACTATED RINGERS IV SOLN: INTRAVENOUS | Status: DC

## 2020-09-26 MED ORDER — PROPOFOL 10 MG/ML IV BOLUS
INTRAVENOUS | Status: DC | PRN
  Administered 2020-09-26 (×7): 20 mg via INTRAVENOUS
  Administered 2020-09-26: 100 mg via INTRAVENOUS
  Administered 2020-09-26 (×5): 20 mg via INTRAVENOUS

## 2020-09-26 MED ORDER — LIDOCAINE HCL (CARDIAC) PF 100 MG/5ML IV SOSY
PREFILLED_SYRINGE | INTRAVENOUS | Status: DC | PRN
  Administered 2020-09-26: 30 mg via INTRAVENOUS

## 2020-09-26 MED ORDER — STERILE WATER FOR IRRIGATION IR SOLN
Status: DC | PRN
  Administered 2020-09-26: 150 mL

## 2020-09-26 SURGICAL SUPPLY — 22 items
CLIP HMST 235XBRD CATH ROT (MISCELLANEOUS) IMPLANT
CLIP RESOLUTION 360 11X235 (MISCELLANEOUS)
ELECT REM PT RETURN 9FT ADLT (ELECTROSURGICAL)
ELECTRODE REM PT RTRN 9FT ADLT (ELECTROSURGICAL) IMPLANT
FORCEPS BIOP RAD 4 LRG CAP 4 (CUTTING FORCEPS) IMPLANT
GOWN CVR UNV OPN BCK APRN NK (MISCELLANEOUS) ×2 IMPLANT
GOWN ISOL THUMB LOOP REG UNIV (MISCELLANEOUS) ×4
INJECTOR VARIJECT VIN23 (MISCELLANEOUS) IMPLANT
KIT DEFENDO VALVE AND CONN (KITS) IMPLANT
KIT PRC NS LF DISP ENDO (KITS) ×1 IMPLANT
KIT PROCEDURE OLYMPUS (KITS) ×2
MANIFOLD NEPTUNE II (INSTRUMENTS) ×2 IMPLANT
MARKER SPOT ENDO TATTOO 5ML (MISCELLANEOUS) IMPLANT
PROBE APC STR FIRE (PROBE) IMPLANT
RETRIEVER NET ROTH 2.5X230 LF (MISCELLANEOUS) IMPLANT
SNARE COLD EXACTO (MISCELLANEOUS) ×2 IMPLANT
SNARE SHORT THROW 13M SML OVAL (MISCELLANEOUS) IMPLANT
SNARE SNG USE RND 15MM (INSTRUMENTS) IMPLANT
SPOT EX ENDOSCOPIC TATTOO (MISCELLANEOUS)
TRAP ETRAP POLY (MISCELLANEOUS) ×2 IMPLANT
VARIJECT INJECTOR VIN23 (MISCELLANEOUS)
WATER STERILE IRR 250ML POUR (IV SOLUTION) ×2 IMPLANT

## 2020-09-26 NOTE — Transfer of Care (Signed)
Immediate Anesthesia Transfer of Care Note  Patient: Ann Robinson  Procedure(s) Performed: COLONOSCOPY WITH PROPOFOL  Patient Location: PACU  Anesthesia Type: General  Level of Consciousness: awake, alert  and patient cooperative  Airway and Oxygen Therapy: Patient Spontanous Breathing and Patient connected to supplemental oxygen  Post-op Assessment: Post-op Vital signs reviewed, Patient's Cardiovascular Status Stable, Respiratory Function Stable, Patent Airway and No signs of Nausea or vomiting  Post-op Vital Signs: Reviewed and stable  Complications: No notable events documented.

## 2020-09-26 NOTE — Anesthesia Procedure Notes (Signed)
Procedure Name: MAC Date/Time: 09/26/2020 11:05 AM Performed by: Georga Bora, CRNA Pre-anesthesia Checklist: Patient identified, Emergency Drugs available, Suction available, Patient being monitored and Timeout performed Patient Re-evaluated:Patient Re-evaluated prior to induction Oxygen Delivery Method: Nasal cannula Placement Confirmation: positive ETCO2 and breath sounds checked- equal and bilateral

## 2020-09-26 NOTE — Op Note (Signed)
[facility] Gastroenterology Patient Name: Ann Robinson Procedure Date: 09/26/2020 10:52 AM MRN: [id number] Account #: [id number] Date of Birth: Apr 19, 1960 Admit Type: Outpatient Age: 60 Room: [facility] OR ROOM 01 Gender: Female Note Status: Finalized Procedure:             Colonoscopy Indications:           High risk colon cancer surveillance: Personal history                         of colonic polyps Providers:             Lucilla Lame MD, MD Referring MD:          Barbaraann Faster. Cannady (Referring MD) Medicines:             Propofol per Anesthesia Complications:         No immediate complications. Procedure:             Pre-Anesthesia Assessment:                        - Prior to the procedure, a History and Physical was                         performed, and patient medications and allergies were                         reviewed. The patient's tolerance of previous                         anesthesia was also reviewed. The risks and benefits                         of the procedure and the sedation options and risks                         were discussed with the patient. All questions were                         answered, and informed consent was obtained. Prior                         Anticoagulants: The patient has taken no previous                         anticoagulant or antiplatelet agents. ASA Grade                         Assessment: II - A patient with mild systemic disease.                         After reviewing the risks and benefits, the patient                         was deemed in satisfactory condition to undergo the                         procedure.  After obtaining informed consent, the colonoscope was                         passed under direct vision. Throughout the procedure,                         the patient's blood pressure, pulse, and oxygen                         saturations were monitored continuously. The                          Colonoscope was introduced through the anus and                         advanced to the the cecum, identified by appendiceal                         orifice and ileocecal valve. The colonoscopy was                         performed without difficulty. The patient tolerated                         the procedure well. The quality of the bowel                         preparation was excellent. Findings:      The perianal and digital rectal examinations were normal.      A 4 mm polyp was found in the appendiceal orifice. The polyp was       sessile. The polyp was removed with a cold snare. Resection and       retrieval were complete.      A 3 mm polyp was found in the descending colon. The polyp was sessile.       The polyp was removed with a cold snare. Resection and retrieval were       complete. Impression:            - One 4 mm polyp at the appendiceal orifice, removed                         with a cold snare. Resected and retrieved.                        - One 3 mm polyp in the descending colon, removed with                         a cold snare. Resected and retrieved. Recommendation:        - Discharge patient to home.                        - Resume previous diet.                        - Continue present medications.                        - Await pathology results.                        -  Repeat colonoscopy in 5 years for surveillance. Procedure Code(s):     --- Professional ---                        [phone], Colonoscopy, flexible; with removal of                         tumor(s), polyp(s), or other lesion(s) by snare                         technique Diagnosis Code(s):     --- Professional ---                        Z86.010, Personal history of colonic polyps                        K63.5, Polyp of colon CPT copyright 2019 American Medical Association. All rights reserved. The codes documented in this report are preliminary and upon coder review may  be revised  to meet current compliance requirements. Lucilla Lame MD, MD 09/26/2020 11:22:36 AM This report has been signed electronically. Number of Addenda: 0 Note Initiated On: 09/26/2020 10:52 AM Scope Withdrawal Time: 0 hours 10 minutes 41 seconds  Total Procedure Duration: 0 hours 14 minutes 20 seconds  Estimated Blood Loss:  Estimated blood loss: none.      [facility]

## 2020-09-26 NOTE — Anesthesia Preprocedure Evaluation (Signed)
Anesthesia Evaluation  Patient identified by MRN, date of birth, ID band Patient awake    Reviewed: NPO status   History of Anesthesia Complications Negative for: history of anesthetic complications  Airway Mallampati: II  TM Distance: >3 FB Neck ROM: full    Dental no notable dental hx.    Pulmonary neg pulmonary ROS,    Pulmonary exam normal        Cardiovascular Exercise Tolerance: Good negative cardio ROS Normal cardiovascular exam     Neuro/Psych  Headaches, negative psych ROS   GI/Hepatic Neg liver ROS, GERD  Controlled,  Endo/Other  Hypothyroidism Morbid obesity (bmi=51)  Renal/GU negative Renal ROS  negative genitourinary   Musculoskeletal   Abdominal   Peds  Hematology negative hematology ROS (+)   Anesthesia Other Findings   Reproductive/Obstetrics                             Anesthesia Physical Anesthesia Plan  ASA: 3  Anesthesia Plan: General   Post-op Pain Management:    Induction:   PONV Risk Score and Plan: 3 and Propofol infusion, TIVA and Treatment may vary due to age or medical condition  Airway Management Planned:   Additional Equipment:   Intra-op Plan:   Post-operative Plan:   Informed Consent: I have reviewed the patients History and Physical, chart, labs and discussed the procedure including the risks, benefits and alternatives for the proposed anesthesia with the patient or authorized representative who has indicated his/her understanding and acceptance.       Plan Discussed with: CRNA  Anesthesia Plan Comments:         Anesthesia Quick Evaluation

## 2020-09-26 NOTE — H&P (Signed)
Lucilla Lame, MD [facility] [address], [place], [place] [address] [phone] Fax : [phone]  Primary Care Physician:  Venita Lick, NP Primary Gastroenterologist:  Dr. Allen Norris  Pre-Procedure History & Physical: HPI:  Ann Robinson is a 60 y.o. female is here for an colonoscopy.   Past Medical History:  Diagnosis Date   Anemia    In the past   Goiter    History of kidney stones    Migraines    Nephrolithiasis 1988   Thyroid disease     Past Surgical History:  Procedure Laterality Date   ABDOMINAL HYSTERECTOMY     CHOLECYSTECTOMY     COLONOSCOPY WITH PROPOFOL N/A 07/20/2017   Procedure: COLONOSCOPY WITH PROPOFOL;  Surgeon: Toledo, Benay Pike, MD;  Location: [facility] ENDOSCOPY;  Service: Gastroenterology;  Laterality: N/A;   CYSTOSCOPY N/A 04/21/2016   Procedure: CYSTOSCOPY;  Surgeon: Gillis Ends, MD;  Location: [facility] ORS;  Service: Gynecology;  Laterality: N/A;   ROBOTIC ASSISTED TOTAL HYSTERECTOMY WITH BILATERAL SALPINGO OOPHERECTOMY Bilateral 04/21/2016   Procedure: ROBOTIC ASSISTED TOTAL HYSTERECTOMY WITH BILATERAL SALPINGO OOPHORECTOMY;  Surgeon: Gillis Ends, MD;  Location: [facility] ORS;  Service: Gynecology;  Laterality: Bilateral;   SENTINEL NODE BIOPSY N/A 04/21/2016   Procedure: SENTINEL NODE BIOPSY;  Surgeon: Gillis Ends, MD;  Location: [facility] ORS;  Service: Gynecology;  Laterality: N/A;    Prior to Admission medications   Medication Sig Start Date End Date Taking? Authorizing Provider  Ascorbic Acid (VITAMIN C) 1000 MG tablet Take 1,000 mg by mouth daily.   Yes [provider]  Calcium-Magnesium-Vitamin D 300-150-400 MG-MG-UNIT TABS Take by mouth.   Yes [provider]  Cholecalciferol (VITAMIN D) 125 MCG (5000 UT) CAPS Take 1,000 Units by mouth 2 (two) times a week.    Yes [provider]  ibuprofen (ADVIL,MOTRIN) 600 MG tablet Take 1 tablet (600 mg total) by mouth every 6 (six) hours as needed  for fever or headache. 04/22/16  Yes Malachy Mood, MD  levothyroxine (SYNTHROID) 137 MCG tablet Take 1 tablet (137 mcg total) by mouth daily before breakfast. 02/13/19  Yes Cannady, Jolene T, NP  Magnesium Oxide 250 MG TABS Take 400 mg by mouth in the morning and at bedtime.    Yes [provider]  Multiple Vitamin (MULTIVITAMIN WITH MINERALS) TABS tablet Take 1 tablet by mouth daily.   Yes [provider]  Na Sulfate-K Sulfate-Mg Sulf (SUPREP BOWEL PREP KIT) 17.5-3.13-1.6 GM/177ML SOLN Take 1 kit by mouth as directed. 09/03/20  Yes Lucilla Lame, MD  Phenylephrine HCl 5 MG TABS Take 10 mg by mouth as needed.    Yes [provider]  rosuvastatin (CRESTOR) 10 MG tablet Take 1 tablet (10 mg total) by mouth daily. 09/15/20  Yes Cannady, Jolene T, NP  Biotin 1000 MCG tablet Take by mouth.  Patient not taking: Reported on 09/10/2020    [provider]    Allergies as of 09/03/2020 - Review Complete 04/14/2020  Allergen Reaction Noted   Sulfa antibiotics Rash 11/18/2014    Family History  Problem Relation Age of Onset   Cancer Mother        colon and breast   Diabetes Mother    Breast cancer Mother 54   Alzheimer's disease Father    Heart disease Father    Hypertension Father    Diabetes Father    Diabetes Sister    Hypertension Sister    Diabetes Brother    Liver disease Brother  Heart disease Maternal Grandfather        MI   Stroke Paternal Grandmother    Thyroid disease Sister    Diabetes Brother    Breast cancer Maternal Aunt        late 74's early 62's   Breast cancer Cousin 82    Social History   Socioeconomic History   Marital status: Widowed    Spouse name: Not on file   Number of children: Not on file   Years of education: Not on file   Highest education level: Not on file  Occupational History   Not on file  Tobacco Use   Smoking status: Never   Smokeless tobacco: Never  Vaping Use   Vaping Use: Never used  Substance and  Sexual Activity   Alcohol use: Yes    Alcohol/week: 0.0 standard drinks    Comment: occasional   Drug use: No   Sexual activity: Yes  Other Topics Concern   Not on file  Social History Narrative   Not on file   Social Determinants of Health   Financial Resource Strain: Not on file  Food Insecurity: Not on file  Transportation Needs: Not on file  Physical Activity: Not on file  Stress: Not on file  Social Connections: Not on file  Intimate Partner Violence: Not on file    Review of Systems: See HPI, otherwise negative ROS  Physical Exam: BP 128/72   Pulse 64   Temp (!) 97.2 F (36.2 C) (Temporal)   Resp 20   Ht _0  (1.702 m)   Wt (!) 146.6 kg   LMP 09/17/2013 (Approximate)   SpO2 100%   BMI 50.62 kg/m  General:   Alert,  pleasant and cooperative in NAD Head:  Normocephalic and atraumatic. Neck:  Supple; no masses or thyromegaly. Lungs:  Clear throughout to auscultation.    Heart:  Regular rate and rhythm. Abdomen:  Soft, nontender and nondistended. Normal bowel sounds, without guarding, and without rebound.   Neurologic:  Alert and  oriented x4;  grossly normal neurologically.  Impression/Plan: Ann Robinson is here for an colonoscopy to be performed for a history of adenomatous polyps on 2019    Risks, benefits, limitations, and alternatives regarding  colonoscopy have been reviewed with the patient.  Questions have been answered.  All parties agreeable.   Lucilla Lame, MD  09/26/2020, 10:23 AM

## 2020-09-26 NOTE — Anesthesia Postprocedure Evaluation (Signed)
Anesthesia Post Note  Patient: Ann Robinson  Procedure(s) Performed: COLONOSCOPY WITH PROPOFOL     Patient location during evaluation: PACU Anesthesia Type: General Level of consciousness: awake and alert Pain management: pain level controlled Vital Signs Assessment: post-procedure vital signs reviewed and stable Respiratory status: spontaneous breathing, nonlabored ventilation, respiratory function stable and patient connected to nasal cannula oxygen Cardiovascular status: blood pressure returned to baseline and stable Postop Assessment: no apparent nausea or vomiting Anesthetic complications: no   No notable events documented.  Fidel Levy

## 2020-09-29 ENCOUNTER — Encounter: Payer: Self-pay | Admitting: Gastroenterology

## 2020-09-30 LAB — SURGICAL PATHOLOGY

## 2020-10-01 ENCOUNTER — Encounter: Payer: Self-pay | Admitting: Gastroenterology

## 2020-11-10 ENCOUNTER — Other Ambulatory Visit: Payer: Self-pay | Admitting: Nurse Practitioner

## 2020-11-10 DIAGNOSIS — Z1231 Encounter for screening mammogram for malignant neoplasm of breast: Secondary | ICD-10-CM

## 2020-11-25 ENCOUNTER — Ambulatory Visit
Admission: RE | Admit: 2020-11-25 | Discharge: 2020-11-25 | Disposition: A | Discharge status: Home / Self Care | Payer: 59 | Source: Ambulatory Visit | Attending: Nurse Practitioner | Admitting: Nurse Practitioner

## 2020-11-25 ENCOUNTER — Other Ambulatory Visit: Payer: Self-pay

## 2020-11-25 DIAGNOSIS — Z1231 Encounter for screening mammogram for malignant neoplasm of breast: Secondary | ICD-10-CM

## 2021-03-15 DIAGNOSIS — E7800 Pure hypercholesterolemia, unspecified: Secondary | ICD-10-CM | POA: Insufficient documentation

## 2021-03-15 DIAGNOSIS — R7309 Other abnormal glucose: Secondary | ICD-10-CM | POA: Insufficient documentation

## 2021-03-15 DIAGNOSIS — E782 Mixed hyperlipidemia: Secondary | ICD-10-CM | POA: Insufficient documentation

## 2021-03-16 ENCOUNTER — Encounter: Payer: Self-pay | Admitting: Nurse Practitioner

## 2021-03-16 ENCOUNTER — Other Ambulatory Visit: Payer: Self-pay

## 2021-03-16 ENCOUNTER — Ambulatory Visit (INDEPENDENT_AMBULATORY_CARE_PROVIDER_SITE_OTHER): Payer: 59 | Admitting: Nurse Practitioner

## 2021-03-16 DIAGNOSIS — E890 Postprocedural hypothyroidism: Secondary | ICD-10-CM

## 2021-03-16 DIAGNOSIS — Z23 Encounter for immunization: Secondary | ICD-10-CM | POA: Diagnosis not present

## 2021-03-16 DIAGNOSIS — R7309 Other abnormal glucose: Secondary | ICD-10-CM

## 2021-03-16 DIAGNOSIS — E559 Vitamin D deficiency, unspecified: Secondary | ICD-10-CM

## 2021-03-16 DIAGNOSIS — Z0000 Encounter for general adult medical examination without abnormal findings: Secondary | ICD-10-CM

## 2021-03-16 DIAGNOSIS — Z8669 Personal history of other diseases of the nervous system and sense organs: Secondary | ICD-10-CM | POA: Insufficient documentation

## 2021-03-16 DIAGNOSIS — E782 Mixed hyperlipidemia: Secondary | ICD-10-CM

## 2021-03-16 DIAGNOSIS — E7800 Pure hypercholesterolemia, unspecified: Secondary | ICD-10-CM

## 2021-03-16 NOTE — Assessment & Plan Note (Signed)
Noted past labs, recheck today and initiate medication as needed based on findings.  

## 2021-03-16 NOTE — Assessment & Plan Note (Signed)
Chronic, ongoing.  LDL improved on work labs.  Did not tolerate Atorvastatin.  Continue Crestor at low dose daily, could try increase if needed but concern for myalgia with this.  Lipid panel and CMP today.

## 2021-03-16 NOTE — Assessment & Plan Note (Signed)
BMI 52.50.  Recommended eating smaller high protein, low fat meals more frequently and exercising 30 mins a day 5 times a week with a goal of 10-15lb weight loss in the next 3 months. Patient voiced their understanding and motivation to adhere to these recommendations.

## 2021-03-16 NOTE — Progress Notes (Signed)
 BP 108/75   Pulse 73   Temp 98.4 F (36.9 C)   Ht 5' 6.5" (1.689 m)   Wt (!) 330 lb 3.2 oz (149.8 kg)   LMP 09/17/2013 (Approximate)   SpO2 98%   BMI 52.50 kg/m    Subjective:    Patient ID: Ann Robinson, female    DOB: 01/11/1961, 60 y.o.   MRN: [id number]  HPI: Yarieliz L Howson is a 60 y.o. female presenting on 03/16/2021 for comprehensive medical examination. Current medical complaints include:none  She currently lives with: significant other Menopausal Symptoms: no   HYPOTHYROIDISM Sees Dr. Solum for this and takes Levothyroxine 137 MCG. Last saw Dr. Solum on 01/13/21.  TSH level was stable. Thyroid control status:stable Satisfied with current treatment? yes Medication side effects: no Medication compliance: good compliance Etiology of hypothyroidism:  Recent dose adjustment:yes Fatigue: no Cold intolerance: at baseline Heat intolerance: no Weight gain: no Weight loss: no Constipation: no Diarrhea/loose stools: no Palpitations: no Lower extremity edema: no Anxiety/depressed mood: no   HYPERLIPIDEMIA Started on Rosuvastatin 10 MG daily, tried Atorvastatin with muscle aches.  Recent LD at workplace 85 she reports. Hyperlipidemia status: good compliance Satisfied with current treatment?  yes Side effects:  no Medication compliance: good compliance Past cholesterol meds: Atorvastatin Supplements: none Aspirin:  no The 10-year ASCVD risk score (Arnett DK, et al., 2019) is: 3.4%   Values used to calculate the score:     Age: 60 years     Sex: Female     Is Non-Hispanic African American: No     Diabetic: No     Tobacco smoker: No     Systolic Blood Pressure: 108 mmHg     Is BP treated: No     HDL Cholesterol: 52 mg/dL     Total Cholesterol: 265 mg/dL Chest pain:  no Coronary artery disease:  no Family history CAD:  yes -- mother's side and father had MI Family history early CAD:  no   Functional Status Survey: Is the patient deaf or have  difficulty hearing?: No Does the patient have difficulty seeing, even when wearing glasses/contacts?: No Does the patient have difficulty concentrating, remembering, or making decisions?: No Does the patient have difficulty walking or climbing stairs?: No Does the patient have difficulty dressing or bathing?: No Does the patient have difficulty doing errands alone such as visiting a doctor's office or shopping?: No  Depression Screen done today and results listed below:  Depression screen PHQ 2/9 03/16/2021 02/19/2020 02/13/2019 09/28/2017 04/20/2017  Decreased Interest 0 0 0 0 0  Down, Depressed, Hopeless 0 0 0 0 0  PHQ - 2 Score 0 0 0 0 0  Altered sleeping 0 - 0 - -  Tired, decreased energy 0 - 1 - -  Change in appetite 0 - 1 - -  Feeling bad or failure about yourself  0 - 0 - -  Trouble concentrating 0 - 0 - -  Moving slowly or fidgety/restless 0 - 0 - -  Suicidal thoughts 0 - 0 - -  PHQ-9 Score 0 - 2 - -  Difficult doing work/chores Not difficult at all - Not difficult at all - -   GAD 7 : Generalized Anxiety Score 03/16/2021 02/13/2019  Nervous, Anxious, on Edge 0 0  Control/stop worrying 0 0  Worry too much - different things 0 0  Trouble relaxing 0 0  Restless 0 0  Easily annoyed or irritable 0 0  Afraid - awful might happen   0 0  Total GAD 7 Score 0 0  Anxiety Difficulty Not difficult at all -    Fall Risk 04/20/2017 09/28/2017 04/14/2020 09/26/2020 03/16/2021  Falls in the past year? No No - - 0  Was there an injury with Fall? - - - - 0  Fall Risk Category Calculator - - - - 0  Fall Risk Category - - - - Low  Patient Fall Risk Level - - Low fall risk Low fall risk Low fall risk  Patient at Risk for Falls Due to - - - - No Fall Risks  Fall risk Follow up - - - - Falls evaluation completed     Past Medical History:  Past Medical History:  Diagnosis Date   Anemia    In the past   Goiter    History of kidney stones    Migraines    Nephrolithiasis 1988   Thyroid  disease     Surgical History:  Past Surgical History:  Procedure Laterality Date   ABDOMINAL HYSTERECTOMY     CHOLECYSTECTOMY     COLONOSCOPY WITH PROPOFOL N/A 07/20/2017   Procedure: COLONOSCOPY WITH PROPOFOL;  Surgeon: Toledo, Teodoro K, MD;  Location: [facility];  Service: Gastroenterology;  Laterality: N/A;   COLONOSCOPY WITH PROPOFOL N/A 09/26/2020   Procedure: COLONOSCOPY WITH PROPOFOL;  Surgeon: Wohl, Darren, MD;  Location: [facility];  Service: Endoscopy;  Laterality: N/A;   CYSTOSCOPY N/A 04/21/2016   Procedure: CYSTOSCOPY;  Surgeon: Angeles Alvarez Secord, MD;  Location: [facility];  Service: Gynecology;  Laterality: N/A;   ROBOTIC ASSISTED TOTAL HYSTERECTOMY WITH BILATERAL SALPINGO OOPHERECTOMY Bilateral 04/21/2016   Procedure: ROBOTIC ASSISTED TOTAL HYSTERECTOMY WITH BILATERAL SALPINGO OOPHORECTOMY;  Surgeon: Angeles Alvarez Secord, MD;  Location: [facility];  Service: Gynecology;  Laterality: Bilateral;   SENTINEL NODE BIOPSY N/A 04/21/2016   Procedure: SENTINEL NODE BIOPSY;  Surgeon: Angeles Alvarez Secord, MD;  Location: [facility];  Service: Gynecology;  Laterality: N/A;    Medications:  Current Outpatient Medications on File Prior to Visit  Medication Sig   Ascorbic Acid (VITAMIN C) 1000 MG tablet Take 1,000 mg by mouth daily.   Biotin 1000 MCG tablet Take by mouth.   Calcium-Magnesium-Vitamin D 300-150-400 MG-MG-UNIT TABS Take by mouth.   Cholecalciferol (VITAMIN D) 125 MCG (5000 UT) CAPS Take 1,000 Units by mouth 2 (two) times a week.    ibuprofen (ADVIL,MOTRIN) 600 MG tablet Take 1 tablet (600 mg total) by mouth every 6 (six) hours as needed for fever or headache.   levothyroxine (SYNTHROID) 137 MCG tablet Take 1 tablet (137 mcg total) by mouth daily before breakfast.   Magnesium Oxide 250 MG TABS Take 400 mg by mouth in the morning and at bedtime.    Multiple Vitamin (MULTIVITAMIN WITH MINERALS) TABS tablet Take 1 tablet by mouth daily.   Phenylephrine HCl 5 MG  TABS Take 10 mg by mouth as needed.    rosuvastatin (CRESTOR) 10 MG tablet Take 1 tablet (10 mg total) by mouth daily.   Na Sulfate-K Sulfate-Mg Sulf (SUPREP BOWEL PREP KIT) 17.5-3.13-1.6 GM/177ML SOLN Take 1 kit by mouth as directed. (Patient not taking: Reported on 03/16/2021)   No current facility-administered medications on file prior to visit.    Allergies:  Allergies  Allergen Reactions   Sulfa Antibiotics Rash    Social History:  Social History   Socioeconomic History   Marital status: Widowed    Spouse name: Not on file   Number of children: Not on file     Years of education: Not on file   Highest education level: Not on file  Occupational History   Not on file  Tobacco Use   Smoking status: Never   Smokeless tobacco: Never  Vaping Use   Vaping Use: Never used  Substance and Sexual Activity   Alcohol use: Yes    Alcohol/week: 0.0 standard drinks    Comment: occasional   Drug use: No   Sexual activity: Yes  Other Topics Concern   Not on file  Social History Narrative   Not on file   Social Determinants of Health   Financial Resource Strain: Low Risk    Difficulty of Paying Living Expenses: Not hard at all  Food Insecurity: No Food Insecurity   Worried About Running Out of Food in the Last Year: Never true   Ran Out of Food in the Last Year: Never true  Transportation Needs: No Transportation Needs   Lack of Transportation (Medical): No   Lack of Transportation (Non-Medical): No  Physical Activity: Inactive   Days of Exercise per Week: 0 days   Minutes of Exercise per Session: 0 min  Stress: No Stress Concern Present   Feeling of Stress : Only a little  Social Connections: Socially Isolated   Frequency of Communication with Friends and Family: More than three times a week   Frequency of Social Gatherings with Friends and Family: More than three times a week   Attends Religious Services: Never   Active Member of Clubs or Organizations: No   Attends Club  or Organization Meetings: Never   Marital Status: Never married  Intimate Partner Violence: Not At Risk   Fear of Current or Ex-Partner: No   Emotionally Abused: No   Physically Abused: No   Sexually Abused: No   Social History   Tobacco Use  Smoking Status Never  Smokeless Tobacco Never   Social History   Substance and Sexual Activity  Alcohol Use Yes   Alcohol/week: 0.0 standard drinks   Comment: occasional    Family History:  Family History  Problem Relation Age of Onset   Cancer Mother        colon and breast   Diabetes Mother    Breast cancer Mother 79   Alzheimer's disease Father    Heart disease Father    Hypertension Father    Diabetes Father    Diabetes Sister    Hypertension Sister    Diabetes Brother    Liver disease Brother    Heart disease Maternal Grandfather        MI   Stroke Paternal Grandmother    Thyroid disease Sister    Diabetes Brother    Breast cancer Maternal Aunt        late 60's early 70's   Breast cancer Cousin 40    Past medical history, surgical history, medications, allergies, family history and social history reviewed with patient today and changes made to appropriate areas of the chart.   Review of Systems - negative All other ROS negative except what is listed above and in the HPI.      Objective:    BP 108/75   Pulse 73   Temp 98.4 F (36.9 C)   Ht 5' 6.5" (1.689 m)   Wt (!) 330 lb 3.2 oz (149.8 kg)   LMP 09/17/2013 (Approximate)   SpO2 98%   BMI 52.50 kg/m   Wt Readings from Last 3 Encounters:  03/16/21 (!) 330 lb 3.2 oz (149.8 kg)  09/26/20 (!)   323 lb 3.2 oz (146.6 kg)  04/14/20 (!) 335 lb 1.6 oz (152 kg)    Physical Exam Vitals and nursing note reviewed.  Constitutional:      General: She is awake. She is not in acute distress.    Appearance: She is well-developed. She is not ill-appearing.  HENT:     Head: Normocephalic and atraumatic.     Right Ear: Hearing, tympanic membrane, ear canal and external  ear normal. No drainage.     Left Ear: Hearing, tympanic membrane, ear canal and external ear normal. No drainage.     Nose: Nose normal.     Right Sinus: No maxillary sinus tenderness or frontal sinus tenderness.     Left Sinus: No maxillary sinus tenderness or frontal sinus tenderness.     Mouth/Throat:     Mouth: Mucous membranes are moist.     Pharynx: Oropharynx is clear. Uvula midline. No pharyngeal swelling, oropharyngeal exudate or posterior oropharyngeal erythema.  Eyes:     General: Lids are normal.        Right eye: No discharge.        Left eye: No discharge.     Extraocular Movements: Extraocular movements intact.     Conjunctiva/sclera: Conjunctivae normal.     Pupils: Pupils are equal, round, and reactive to light.     Visual Fields: Right eye visual fields normal and left eye visual fields normal.  Neck:     Thyroid: No thyromegaly.     Vascular: No carotid bruit.     Trachea: Trachea normal.  Cardiovascular:     Rate and Rhythm: Normal rate and regular rhythm.     Heart sounds: Normal heart sounds. No murmur heard.   No gallop.  Pulmonary:     Effort: Pulmonary effort is normal. No accessory muscle usage or respiratory distress.     Breath sounds: Normal breath sounds.  Chest:     Comments: Deferred per patient request.  Recent mammogram. Abdominal:     General: Bowel sounds are normal.     Palpations: Abdomen is soft. There is no hepatomegaly or splenomegaly.     Tenderness: There is no abdominal tenderness.  Musculoskeletal:        General: Normal range of motion.     Cervical back: Normal range of motion and neck supple.     Right lower leg: No edema.     Left lower leg: No edema.  Lymphadenopathy:     Head:     Right side of head: No submental, submandibular, tonsillar, preauricular or posterior auricular adenopathy.     Left side of head: No submental, submandibular, tonsillar, preauricular or posterior auricular adenopathy.     Cervical: No cervical  adenopathy.  Skin:    General: Skin is warm and dry.     Capillary Refill: Capillary refill takes less than 2 seconds.     Findings: No rash.  Neurological:     Mental Status: She is alert and oriented to person, place, and time.     Gait: Gait is intact.     Deep Tendon Reflexes: Reflexes are normal and symmetric.     Reflex Scores:      Brachioradialis reflexes are 2+ on the right side and 2+ on the left side.      Patellar reflexes are 2+ on the right side and 2+ on the left side. Psychiatric:        Attention and Perception: Attention normal.  Mood and Affect: Mood normal.        Speech: Speech normal.        Behavior: Behavior normal. Behavior is cooperative.        Thought Content: Thought content normal.        Judgment: Judgment normal.    Results for orders placed or performed during the hospital encounter of 09/26/20  Surgical pathology  Result Value Ref Range   SURGICAL PATHOLOGY      SURGICAL PATHOLOGY CASE: [id number] PATIENT: Lu Duffel Surgical Pathology Report     Specimen Submitted: A. Colon polyp, cecum; cold snare B. Colon polyp, descending; cold snare  Clinical History: Personal history of colorectal adenomas.     DIAGNOSIS: A.  COLON POLYP, CECUM; COLD SNARE: - TUBULAR ADENOMA, 3 FRAGMENTS. - NEGATIVE FOR HIGH-GRADE DYSPLASIA AND MALIGNANCY.  B.  COLON POLYP, DESCENDING; COLD SNARE: - TUBULAR ADENOMA. - NEGATIVE FOR HIGH-GRADE DYSPLASIA AND MALIGNANCY.  GROSS DESCRIPTION: A. Labeled: Cecal polyp (per requisition cold snare) Received: Formalin Collection time: 10:53 AM on 09/26/2020 Placed into formalin time: 10:53 AM on 09/26/2020 Tissue fragment(s): Multiple Size: Aggregate, 0.7 x 0.4 x 0.1 cm Description: Tan soft tissue fragments Entirely submitted in 1 cassette.  B. Labeled: Descending colon polyp (per requisition cold snare) Received: Formalin Collection time: 11:19 AM on 09/26/2020 Placed into formalin time : 11:19  AM on 09/26/2020 Tissue fragment(s): 1 Size: 0.4 x 0.3 x 0.1 cm Description: White soft tissue fragment Entirely submitted in 1 cassette.  RB 09/29/2020  Final Diagnosis performed by Bryan Lemma, MD.   Electronically signed 09/30/2020 4:28:22PM The electronic signature indicates that the named Attending Pathologist has evaluated the specimen Technical component performed at [facility], [address] [place], [place] [address] Lab: [phone] Dir: Rush Farmer, MD, MMM  Professional component performed at [facility], [facility], [place], [place], [place] [address] Lab: [phone] Dir: Dellia Nims. Reuel Derby, MD       Assessment & Plan:   Problem List Items Addressed This Visit       Endocrine   Hypothyroidism    Chronic, ongoing.  Followed by endocrinology.  Recommend continuing current dose and reviewed Dr. Joycie Peek recent note.  No thyroid labs today, are assessed with endo.          Other   Elevated hemoglobin A1c measurement    Noted past labs, recheck today and initiate medication as needed based on findings.       Relevant Orders   CBC with Differential/Platelet   HgB A1c   Microalbumin, Urine Waived   Hyperlipemia, mixed    Chronic, ongoing.  LDL improved on work labs.  Did not tolerate Atorvastatin.  Continue Crestor at low dose daily, could try increase if needed but concern for myalgia with this.  Lipid panel and CMP today.      Morbid obesity ([place]) - Primary    BMI 52.50.  Recommended eating smaller high protein, low fat meals more frequently and exercising 30 mins a day 5 times a week with a goal of 10-15lb weight loss in the next 3 months. Patient voiced their understanding and motivation to adhere to these recommendations.       Relevant Orders   CBC with Differential/Platelet   Vitamin D deficiency    Recheck Vit D level today and continue daily supplement.  DEXA at age 55.      Relevant Orders   VITAMIN D 25 Hydroxy (Vit-D  Deficiency, Fractures)   Other Visit Diagnoses  Encounter for annual physical exam       Annual physical with labs today.  Health maintenance reviewed.   Flu vaccine need       Flu vaccine today   Relevant Orders   Flu Vaccine QUAD 6+ mos PF IM (Fluarix Quad PF) (Completed)        Follow up plan: Return in about 1 year (around 03/16/2022) for Annual physical.   LABORATORY TESTING:  - Pap smear: not applicable  IMMUNIZATIONS:   - Tdap: Tetanus vaccination status reviewed: last tetanus booster within 10 years. - Influenza: Up to date - Pneumovax: Not applicable - Prevnar: Not applicable - HPV: Not applicable - Zostavax vaccine:  x 1 year ago, will obtain records from [place]: Has had x 3  SCREENING: -Mammogram: Up To Date -- 11/25/20 - Colonoscopy: Up to date  - Bone Density: Not applicable  -Hearing Test: Not applicable  -Spirometry: Not applicable   PATIENT COUNSELING:   Advised to take 1 mg of folate supplement per day if capable of pregnancy.   Sexuality: Discussed sexually transmitted diseases, partner selection, use of condoms, avoidance of unintended pregnancy  and contraceptive alternatives.   Advised to avoid cigarette smoking.  I discussed with the patient that most people either abstain from alcohol or drink within safe limits (<=14/week and <=4 drinks/occasion for males, <=7/weeks and <= 3 drinks/occasion for females) and that the risk for alcohol disorders and other health effects rises proportionally with the number of drinks per week and how often a drinker exceeds daily limits.  Discussed cessation/primary prevention of drug use and availability of treatment for abuse.   Diet: Encouraged to adjust caloric intake to maintain  or achieve ideal body weight, to reduce intake of dietary saturated fat and total fat, to limit sodium intake by avoiding high sodium foods and not adding table salt, and to maintain adequate dietary potassium and calcium  preferably from fresh fruits, vegetables, and low-fat dairy products.    Stressed the importance of regular exercise  Injury prevention: Discussed safety belts, safety helmets, smoke detector, smoking near bedding or upholstery.   Dental health: Discussed importance of regular tooth brushing, flossing, and dental visits.    NEXT PREVENTATIVE PHYSICAL DUE IN 1 YEAR. Return in about 1 year (around 03/16/2022) for Annual physical.

## 2021-03-16 NOTE — Patient Instructions (Signed)

## 2021-03-16 NOTE — Assessment & Plan Note (Signed)
Chronic, ongoing.  Followed by endocrinology.  Recommend continuing current dose and reviewed Dr. Joycie Peek recent note.  No thyroid labs today, are assessed with endo.

## 2021-03-16 NOTE — Assessment & Plan Note (Signed)
Recheck Vit D level today and continue daily supplement.  DEXA at age 60.

## 2021-03-17 LAB — CBC WITH DIFFERENTIAL/PLATELET
Basophils Absolute: 0 10*3/uL (ref 0.0–0.2)
Basos: 1 %
EOS (ABSOLUTE): 0.2 10*3/uL (ref 0.0–0.4)
Eos: 3 %
Hematocrit: 40.4 % (ref 34.0–46.6)
Hemoglobin: 13 g/dL (ref 11.1–15.9)
Immature Grans (Abs): 0 10*3/uL (ref 0.0–0.1)
Immature Granulocytes: 0 %
Lymphocytes Absolute: 2.8 10*3/uL (ref 0.7–3.1)
Lymphs: 34 %
MCH: 27.3 pg (ref 26.6–33.0)
MCHC: 32.2 g/dL (ref 31.5–35.7)
MCV: 85 fL (ref 79–97)
Monocytes Absolute: 0.4 10*3/uL (ref 0.1–0.9)
Monocytes: 6 %
Neutrophils Absolute: 4.6 10*3/uL (ref 1.4–7.0)
Neutrophils: 56 %
Platelets: 325 10*3/uL (ref 150–450)
RBC: 4.76 x10E6/uL (ref 3.77–5.28)
RDW: 14 % (ref 11.7–15.4)
WBC: 8 10*3/uL (ref 3.4–10.8)

## 2021-03-17 LAB — MICROALBUMIN, URINE WAIVED
Creatinine, Urine Waived: 50 mg/dL (ref 10–300)
Microalb, Ur Waived: 10 mg/L (ref 0–19)
Microalb/Creat Ratio: <30 mg/g (ref <30)

## 2021-03-17 LAB — COMPREHENSIVE METABOLIC PANEL
ALT: 15 IU/L (ref 0–32)
AST: 16 IU/L (ref 0–40)
Albumin/Globulin Ratio: 1.7 (ref 1.2–2.2)
Albumin: 4 g/dL (ref 3.8–4.8)
Alkaline Phosphatase: 85 IU/L (ref 44–121)
BUN/Creatinine Ratio: 17 (ref 12–28)
BUN: 11 mg/dL (ref 8–27)
Bilirubin Total: 0.3 mg/dL (ref 0.0–1.2)
CO2: 25 mmol/L (ref 20–29)
Calcium: 9.2 mg/dL (ref 8.7–10.3)
Chloride: 105 mmol/L (ref 96–106)
Creatinine, Ser: 0.64 mg/dL (ref 0.57–1.00)
Globulin, Total: 2.3 g/dL (ref 1.5–4.5)
Glucose: 83 mg/dL (ref 70–99)
Potassium: 4.2 mmol/L (ref 3.5–5.2)
Sodium: 142 mmol/L (ref 134–144)
Total Protein: 6.3 g/dL (ref 6.0–8.5)
eGFR: 100 mL/min/{1.73_m2} (ref >59)

## 2021-03-17 LAB — LIPID PANEL W/O CHOL/HDL RATIO
Cholesterol, Total: 210 mg/dL — ABNORMAL HIGH (ref 100–199)
HDL: 56 mg/dL (ref >39)
LDL Chol Calc (NIH): 136 mg/dL — ABNORMAL HIGH (ref 0–99)
Triglycerides: 103 mg/dL (ref 0–149)
VLDL Cholesterol Cal: 18 mg/dL (ref 5–40)

## 2021-03-17 LAB — HEMOGLOBIN A1C
Est. average glucose Bld gHb Est-mCnc: 123 mg/dL
Hgb A1c MFr Bld: 5.9 % — ABNORMAL HIGH (ref 4.8–5.6)

## 2021-03-17 LAB — VITAMIN D 25 HYDROXY (VIT D DEFICIENCY, FRACTURES): Vit D, 25-Hydroxy: 29.8 ng/mL — ABNORMAL LOW (ref 30.0–100.0)

## 2021-03-17 NOTE — Progress Notes (Signed)
Contacted via MyChart °The 10-year ASCVD risk score (Arnett DK, et al., 2019) is: 2.7% °  Values used to calculate the score: °    Age: 60 years °    Sex: Female °    Is Non-Hispanic African American: No °    Diabetic: No °    Tobacco smoker: No °    Systolic Blood Pressure: 108 mmHg °    Is BP treated: No °    HDL Cholesterol: 56 mg/dL °    Total Cholesterol: 210 mg/dL ° ° °Good morning [place], your labs have returned: °- CBC shows no anemia or infection °- CMP shows normal kidney function, creatinine and eGFR, plus normal liver function, AST and ALT. °- Cholesterol levels are still a bit elevated -- I would recommend continuing Rosuvastatin though as LDL has trended down from 198 to 136 and total cholesterol from 265 to 210.  I would recommend you try taking 2 of the 10 MG Rosuvastatin daily and see if you can tolerate, if you can let me know and I will send in 20 MG tablet.  This would give us tighter control.  Would like to see LDL <100. °- Vitamin D remains a little on low side.  Continue supplement daily, try increasing to Vitamin D3 2000 units daily. °- The A1C is the diabetes testing we talked about, this looks at your blood sugars over the past 3 months and turns the average into a number.  Your number is 5.9%, meaning you are prediabetic.  Any number 5.7 to 6.4 is considered prediabetes and any number 6.5 or greater is considered diabetes.   I would recommend heavy focus on decreasing foods high in sugar and your intake of things like bread products, pasta, and rice.  The American Diabetes Association online has a large amount of information on diet changes to make.  We will recheck this number in 3-6 months to ensure you are not continuing to trend upwards and move into diabetes.  Any questions? °Keep being amazing!!  Thank you for allowing me to participate in your care.  I appreciate you. °Kindest regards, °Jolene ° ° °

## 2021-10-19 ENCOUNTER — Other Ambulatory Visit: Payer: Self-pay | Admitting: Nurse Practitioner

## 2021-10-20 NOTE — Telephone Encounter (Signed)
Requested Prescriptions  Pending Prescriptions Disp Refills  . rosuvastatin (CRESTOR) 10 MG tablet [Pharmacy Med Name: Rosuvastatin Calcium 10 MG Oral Tablet] 90 tablet 1    Sig: TAKE 1 TABLET BY MOUTH  DAILY     Cardiovascular:  Antilipid - Statins 2 Failed - 10/19/2021 10:32 PM      Failed - Lipid Panel in normal range within the last 12 months    Cholesterol, Total  Date Value Ref Range Status  03/16/2021 210 (H) 100 - 199 mg/dL Final   LDL Chol Calc (NIH)  Date Value Ref Range Status  03/16/2021 136 (H) 0 - 99 mg/dL Final   HDL  Date Value Ref Range Status  03/16/2021 56 >39 mg/dL Final   Triglycerides  Date Value Ref Range Status  03/16/2021 103 0 - 149 mg/dL Final         Passed - Cr in normal range and within 360 days    Creatinine, Ser  Date Value Ref Range Status  03/16/2021 0.64 0.57 - 1.00 mg/dL Final         Passed - Patient is not pregnant      Passed - Valid encounter within last 12 months    Recent Outpatient Visits          7 months ago Morbid obesity ([place])   [place], [place] T, NP   1 year ago Morbid obesity ([place])   [place], [employer] T, NP   2 years ago Annual physical exam   [place], Henrine Screws T, NP   4 years ago Annual physical exam   [facility] [name], NP   4 years ago Acute suppurative otitis media of right ear without spontaneous rupture of tympanic membrane, recurrence not specified   [place], [place], DO      Future Appointments            In 4 months [place], Barbaraann Faster, NP [employer]

## 2022-03-14 NOTE — Patient Instructions (Signed)

## 2022-03-17 ENCOUNTER — Encounter: Payer: Self-pay | Admitting: Nurse Practitioner

## 2022-03-17 ENCOUNTER — Ambulatory Visit (INDEPENDENT_AMBULATORY_CARE_PROVIDER_SITE_OTHER): Payer: 59 | Admitting: Nurse Practitioner

## 2022-03-17 DIAGNOSIS — E782 Mixed hyperlipidemia: Secondary | ICD-10-CM | POA: Diagnosis not present

## 2022-03-17 DIAGNOSIS — Z1231 Encounter for screening mammogram for malignant neoplasm of breast: Secondary | ICD-10-CM

## 2022-03-17 DIAGNOSIS — Z0000 Encounter for general adult medical examination without abnormal findings: Secondary | ICD-10-CM

## 2022-03-17 DIAGNOSIS — R7309 Other abnormal glucose: Secondary | ICD-10-CM | POA: Diagnosis not present

## 2022-03-17 DIAGNOSIS — E039 Hypothyroidism, unspecified: Secondary | ICD-10-CM

## 2022-03-17 DIAGNOSIS — E559 Vitamin D deficiency, unspecified: Secondary | ICD-10-CM

## 2022-03-17 DIAGNOSIS — Z23 Encounter for immunization: Secondary | ICD-10-CM

## 2022-03-17 MED ORDER — ROSUVASTATIN CALCIUM 10 MG PO TABS: 10.0000 mg | ORAL_TABLET | Freq: Every day | ORAL | 4 refills | Status: DC

## 2022-03-17 NOTE — Assessment & Plan Note (Signed)
Chronic.  Recheck Vit D level today and continue daily supplement.  DEXA at age 61.

## 2022-03-17 NOTE — Assessment & Plan Note (Signed)
Chronic, ongoing.  Did not tolerate Atorvastatin.  Continue Crestor at low dose daily, could try increase if needed but concern for myalgia with this.  Lipid panel and CMP today.

## 2022-03-17 NOTE — Assessment & Plan Note (Signed)
BMI 49.69 with 18 pounds loss over past year, praised for this.  Recommended eating smaller high protein, low fat meals more frequently and exercising 30 mins a day 5 times a week with a goal of 10-15lb weight loss in the next 3 months. Patient voiced their understanding and motivation to adhere to these recommendations.

## 2022-03-17 NOTE — Progress Notes (Signed)
BP 116/74   Pulse (!) 57   Temp 97.7 F (36.5 C) (Oral)   Ht 5' 6.5" (1.689 m)   Wt (!) 312 lb 8 oz (141.7 kg)   LMP 09/17/2013 (Approximate)   SpO2 99%   BMI 49.69 kg/m    Subjective:    Patient ID: Ann Robinson, female    DOB: 09-21-1960, 61 y.o.   MRN: [id number]  HPI: Ann Robinson is a 61 y.o. female presenting on 03/17/2022 for comprehensive medical examination. Current medical complaints include:none  She currently lives with: significant other Menopausal Symptoms: no   HYPOTHYROIDISM Sees Dr. Gabriel Carina for this and takes Levothyroxine 137 MCG. Last saw endo on 02/02/22.  TSH level was stable per patient -- unable to view in Epic.   Thyroid control status:stable Satisfied with current treatment? yes Medication side effects: no Medication compliance: good compliance Etiology of hypothyroidism:  Recent dose adjustment:yes Fatigue: no Cold intolerance: at baseline Heat intolerance: no Weight gain: no Weight loss: no Constipation: no Diarrhea/loose stools: no Palpitations: no Lower extremity edema: no Anxiety/depressed mood: no   HYPERLIPIDEMIA Continues on Rosuvastatin 10 MG daily, tried Atorvastatin with muscle aches in past.  Taking Vitamin D3 daily.  Has lost 18 pounds over the past year, has retired and overall back to walking routine. Hyperlipidemia status: good compliance Satisfied with current treatment?  yes Side effects:  no Medication compliance: good compliance Past cholesterol meds: Atorvastatin Supplements: none Aspirin:  no The 10-year ASCVD risk score (Arnett DK, et al., 2019) is: 3.4%   Values used to calculate the score:     Age: 68 years     Sex: Female     Is Non-Hispanic African American: No     Diabetic: No     Tobacco smoker: No     Systolic Blood Pressure: 219 mmHg     Is BP treated: No     HDL Cholesterol: 56 mg/dL     Total Cholesterol: 210 mg/dL Chest pain:  no Coronary artery disease:  no Family history CAD:  yes --  mother's side and father had MI Family history early CAD:  no   Functional Status Survey: Is the patient deaf or have difficulty hearing?: No Does the patient have difficulty seeing, even when wearing glasses/contacts?: No Does the patient have difficulty concentrating, remembering, or making decisions?: No Does the patient have difficulty walking or climbing stairs?: No Does the patient have difficulty dressing or bathing?: No Does the patient have difficulty doing errands alone such as visiting a doctor's office or shopping?: No  Depression Screen done today and results listed below:     03/17/2022    4:15 PM 03/16/2021    4:31 PM 02/19/2020    3:21 PM 02/13/2019    3:23 PM 09/28/2017    3:10 PM  Depression screen PHQ 2/9  Decreased Interest 0 0 0 0 0  Down, Depressed, Hopeless 0 0 0 0 0  PHQ - 2 Score 0 0 0 0 0  Altered sleeping 0 0  0   Tired, decreased energy 0 0  1   Change in appetite 0 0  1   Feeling bad or failure about yourself  0 0  0   Trouble concentrating 0 0  0   Moving slowly or fidgety/restless 0 0  0   Suicidal thoughts 0 0  0   PHQ-9 Score 0 0  2   Difficult doing work/chores Not difficult at all Not difficult at all  Not difficult at all       03/17/2022    4:15 PM 03/16/2021    4:32 PM 02/13/2019    3:23 PM  GAD 7 : Generalized Anxiety Score  Nervous, Anxious, on Edge 0 0 0  Control/stop worrying 0 0 0  Worry too much - different things 0 0 0  Trouble relaxing 0 0 0  Restless 0 0 0  Easily annoyed or irritable 0 0 0  Afraid - awful might happen 0 0 0  Total GAD 7 Score 0 0 0  Anxiety Difficulty  Not difficult at all       09/28/2017    3:10 PM 04/14/2020   10:08 AM 09/26/2020    8:50 AM 03/16/2021    4:31 PM 03/17/2022    4:16 PM  Fall Risk  Falls in the past year? No   0 0  Was there an injury with Fall?    0 0  Fall Risk Category Calculator    0 0  Fall Risk Category    Low Low  Patient Fall Risk Level  Low fall risk Low fall risk Low  fall risk Low fall risk  Patient at Risk for Falls Due to    No Fall Risks No Fall Risks  Fall risk Follow up    Falls evaluation completed Falls prevention discussed     Past Medical History:  Past Medical History:  Diagnosis Date   Anemia    In the past   Goiter    History of kidney stones    Migraines    Nephrolithiasis 1988   Thyroid disease     Surgical History:  Past Surgical History:  Procedure Laterality Date   ABDOMINAL HYSTERECTOMY     CHOLECYSTECTOMY     COLONOSCOPY WITH PROPOFOL N/A 07/20/2017   Procedure: COLONOSCOPY WITH PROPOFOL;  Surgeon: Toledo, Benay Pike, MD;  Location: [facility];  Service: Gastroenterology;  Laterality: N/A;   COLONOSCOPY WITH PROPOFOL N/A 09/26/2020   Procedure: COLONOSCOPY WITH PROPOFOL;  Surgeon: Lucilla Lame, MD;  Location: [facility];  Service: Endoscopy;  Laterality: N/A;   CYSTOSCOPY N/A 04/21/2016   Procedure: CYSTOSCOPY;  Surgeon: Gillis Ends, MD;  Location: [facility];  Service: Gynecology;  Laterality: N/A;   ROBOTIC ASSISTED TOTAL HYSTERECTOMY WITH BILATERAL SALPINGO OOPHERECTOMY Bilateral 04/21/2016   Procedure: ROBOTIC ASSISTED TOTAL HYSTERECTOMY WITH BILATERAL SALPINGO OOPHORECTOMY;  Surgeon: Gillis Ends, MD;  Location: [facility];  Service: Gynecology;  Laterality: Bilateral;   SENTINEL NODE BIOPSY N/A 04/21/2016   Procedure: SENTINEL NODE BIOPSY;  Surgeon: Gillis Ends, MD;  Location: [facility];  Service: Gynecology;  Laterality: N/A;    Medications:  Current Outpatient Medications on File Prior to Visit  Medication Sig   Ascorbic Acid (VITAMIN C) 1000 MG tablet Take 1,000 mg by mouth daily.   Calcium-Magnesium-Vitamin D 300-150-400 MG-MG-UNIT TABS Take by mouth.   Cholecalciferol (VITAMIN D) 125 MCG (5000 UT) CAPS Take 1,000 Units by mouth 2 (two) times a week.    ibuprofen (ADVIL,MOTRIN) 600 MG tablet Take 1 tablet (600 mg total) by mouth every 6 (six) hours as needed for fever or  headache.   levothyroxine (SYNTHROID) 137 MCG tablet Take 1 tablet (137 mcg total) by mouth daily before breakfast.   Multiple Vitamin (MULTIVITAMIN WITH MINERALS) TABS tablet Take 1 tablet by mouth daily.   Phenylephrine HCl 5 MG TABS Take 10 mg by mouth as needed.    No current facility-administered medications on file prior  to visit.    Allergies:  Allergies  Allergen Reactions   Sulfa Antibiotics Rash    Social History:  Social History   Socioeconomic History   Marital status: Widowed    Spouse name: Not on file   Number of children: Not on file   Years of education: Not on file   Highest education level: Not on file  Occupational History   Not on file  Tobacco Use   Smoking status: Never   Smokeless tobacco: Never  Vaping Use   Vaping Use: Never used  Substance and Sexual Activity   Alcohol use: Yes    Alcohol/week: 0.0 standard drinks of alcohol    Comment: occasional   Drug use: No   Sexual activity: Yes  Other Topics Concern   Not on file  Social History Narrative   Not on file   Social Determinants of Health   Financial Resource Strain: Low Risk  (03/16/2021)   Overall Financial Resource Strain (CARDIA)    Difficulty of Paying Living Expenses: Not hard at all  Food Insecurity: No Food Insecurity (03/16/2021)   Hunger Vital Sign    Worried About Running Out of Food in the Last Year: Never true    Ran Out of Food in the Last Year: Never true  Transportation Needs: No Transportation Needs (03/16/2021)   PRAPARE - Hydrologist (Medical): No    Lack of Transportation (Non-Medical): No  Physical Activity: Inactive (03/16/2021)   Exercise Vital Sign    Days of Exercise per Week: 0 days    Minutes of Exercise per Session: 0 min  Stress: No Stress Concern Present (03/16/2021)   [place]    Feeling of Stress : Only a little  Social Connections: Socially Isolated  (03/16/2021)   Social Connection and Isolation Panel [NHANES]    Frequency of Communication with Friends and Family: More than three times a week    Frequency of Social Gatherings with Friends and Family: More than three times a week    Attends Religious Services: Never    Marine scientist or Organizations: No    Attends Archivist Meetings: Never    Marital Status: Never married  Intimate Partner Violence: Not At Risk (03/16/2021)   Humiliation, Afraid, Rape, and Kick questionnaire    Fear of Current or Ex-Partner: No    Emotionally Abused: No    Physically Abused: No    Sexually Abused: No   Social History   Tobacco Use  Smoking Status Never  Smokeless Tobacco Never   Social History   Substance and Sexual Activity  Alcohol Use Yes   Alcohol/week: 0.0 standard drinks of alcohol   Comment: occasional    Family History:  Family History  Problem Relation Age of Onset   Cancer Mother        colon and breast   Diabetes Mother    Breast cancer Mother 67   Alzheimer's disease Father    Heart disease Father    Hypertension Father    Diabetes Father    Diabetes Sister    Hypertension Sister    Diabetes Brother    Liver disease Brother    Heart disease Maternal Grandfather        MI   Stroke Paternal Grandmother    Thyroid disease Sister    Diabetes Brother    Breast cancer Maternal Aunt        late  42's early 60's   Breast cancer Cousin 19    Past medical history, surgical history, medications, allergies, family history and social history reviewed with patient today and changes made to appropriate areas of the chart.   Review of Systems - negative All other ROS negative except what is listed above and in the HPI.      Objective:    BP 116/74   Pulse (!) 57   Temp 97.7 F (36.5 C) (Oral)   Ht 5' 6.5" (1.689 m)   Wt (!) 312 lb 8 oz (141.7 kg)   LMP 09/17/2013 (Approximate)   SpO2 99%   BMI 49.69 kg/m   Wt Readings from Last 3 Encounters:   03/17/22 (!) 312 lb 8 oz (141.7 kg)  03/16/21 (!) 330 lb 3.2 oz (149.8 kg)  09/26/20 (!) 323 lb 3.2 oz (146.6 kg)    Physical Exam Vitals and nursing note reviewed.  Constitutional:      General: She is awake. She is not in acute distress.    Appearance: She is well-developed and well-groomed. She is obese. She is not ill-appearing or toxic-appearing.  HENT:     Head: Normocephalic and atraumatic.     Right Ear: Hearing, tympanic membrane, ear canal and external ear normal. No drainage.     Left Ear: Hearing, tympanic membrane, ear canal and external ear normal. No drainage.     Nose: Nose normal.     Right Sinus: No maxillary sinus tenderness or frontal sinus tenderness.     Left Sinus: No maxillary sinus tenderness or frontal sinus tenderness.     Mouth/Throat:     Mouth: Mucous membranes are moist.     Pharynx: Oropharynx is clear. Uvula midline. No pharyngeal swelling, oropharyngeal exudate or posterior oropharyngeal erythema.  Eyes:     General: Lids are normal.        Right eye: No discharge.        Left eye: No discharge.     Extraocular Movements: Extraocular movements intact.     Conjunctiva/sclera: Conjunctivae normal.     Pupils: Pupils are equal, round, and reactive to light.     Visual Fields: Right eye visual fields normal and left eye visual fields normal.  Neck:     Thyroid: No thyromegaly.     Vascular: No carotid bruit.     Trachea: Trachea normal.  Cardiovascular:     Rate and Rhythm: Normal rate and regular rhythm.     Heart sounds: Normal heart sounds. No murmur heard.    No gallop.  Pulmonary:     Effort: Pulmonary effort is normal. No accessory muscle usage or respiratory distress.     Breath sounds: Normal breath sounds.  Chest:     Comments: Deferred per patient request.  Recent mammogram. Abdominal:     General: Bowel sounds are normal.     Palpations: Abdomen is soft. There is no hepatomegaly or splenomegaly.     Tenderness: There is no  abdominal tenderness.  Musculoskeletal:        General: Normal range of motion.     Cervical back: Normal range of motion and neck supple.     Right lower leg: No edema.     Left lower leg: No edema.  Lymphadenopathy:     Head:     Right side of head: No submental, submandibular, tonsillar, preauricular or posterior auricular adenopathy.     Left side of head: No submental, submandibular, tonsillar, preauricular or posterior auricular adenopathy.  Cervical: No cervical adenopathy.  Skin:    General: Skin is warm and dry.     Capillary Refill: Capillary refill takes less than 2 seconds.     Findings: No rash.  Neurological:     Mental Status: She is alert and oriented to person, place, and time.     Gait: Gait is intact.     Deep Tendon Reflexes: Reflexes are normal and symmetric.     Reflex Scores:      Brachioradialis reflexes are 2+ on the right side and 2+ on the left side.      Patellar reflexes are 2+ on the right side and 2+ on the left side. Psychiatric:        Attention and Perception: Attention normal.        Mood and Affect: Mood normal.        Speech: Speech normal.        Behavior: Behavior normal. Behavior is cooperative.        Thought Content: Thought content normal.        Judgment: Judgment normal.    Results for orders placed or performed in visit on 03/16/21  CBC with Differential/Platelet  Result Value Ref Range   WBC 8.0 3.4 - 10.8 x10E3/uL   RBC 4.76 3.77 - 5.28 x10E6/uL   Hemoglobin 13.0 11.1 - 15.9 g/dL   Hematocrit 40.4 34.0 - 46.6 %   MCV 85 79 - 97 fL   MCH 27.3 26.6 - 33.0 pg   MCHC 32.2 31.5 - 35.7 g/dL   RDW 14.0 11.7 - 15.4 %   Platelets 325 150 - 450 x10E3/uL   Neutrophils 56 Not Estab. %   Lymphs 34 Not Estab. %   Monocytes 6 Not Estab. %   Eos 3 Not Estab. %   Basos 1 Not Estab. %   Neutrophils Absolute 4.6 1.4 - 7.0 x10E3/uL   Lymphocytes Absolute 2.8 0.7 - 3.1 x10E3/uL   Monocytes Absolute 0.4 0.1 - 0.9 x10E3/uL   EOS  (ABSOLUTE) 0.2 0.0 - 0.4 x10E3/uL   Basophils Absolute 0.0 0.0 - 0.2 x10E3/uL   Immature Granulocytes 0 Not Estab. %   Immature Grans (Abs) 0.0 0.0 - 0.1 x10E3/uL  Comprehensive metabolic panel  Result Value Ref Range   Glucose 83 70 - 99 mg/dL   BUN 11 8 - 27 mg/dL   Creatinine, Ser 0.64 0.57 - 1.00 mg/dL   eGFR 100 >59 mL/min/1.73   BUN/Creatinine Ratio 17 12 - 28   Sodium 142 134 - 144 mmol/L   Potassium 4.2 3.5 - 5.2 mmol/L   Chloride 105 96 - 106 mmol/L   CO2 25 20 - 29 mmol/L   Calcium 9.2 8.7 - 10.3 mg/dL   Total Protein 6.3 6.0 - 8.5 g/dL   Albumin 4.0 3.8 - 4.8 g/dL   Globulin, Total 2.3 1.5 - 4.5 g/dL   Albumin/Globulin Ratio 1.7 1.2 - 2.2   Bilirubin Total 0.3 0.0 - 1.2 mg/dL   Alkaline Phosphatase 85 44 - 121 IU/L   AST 16 0 - 40 IU/L   ALT 15 0 - 32 IU/L  Lipid Panel w/o Chol/HDL Ratio  Result Value Ref Range   Cholesterol, Total 210 (H) 100 - 199 mg/dL   Triglycerides 103 0 - 149 mg/dL   HDL 56 >39 mg/dL   VLDL Cholesterol Cal 18 5 - 40 mg/dL   LDL Chol Calc (NIH) 136 (H) 0 - 99 mg/dL  VITAMIN D 25 Hydroxy (Vit-D Deficiency,  Fractures)  Result Value Ref Range   Vit D, 25-Hydroxy 29.8 (L) 30.0 - 100.0 ng/mL  HgB A1c  Result Value Ref Range   Hgb A1c MFr Bld 5.9 (H) 4.8 - 5.6 %   Est. average glucose Bld gHb Est-mCnc 123 mg/dL  Microalbumin, Urine Waived  Result Value Ref Range   Microalb, Ur Waived 10 0 - 19 mg/L   Creatinine, Urine Waived 50 10 - 300 mg/dL   Microalb/Creat Ratio <30 <30 mg/g      Assessment & Plan:   Problem List Items Addressed This Visit       Endocrine   Hypothyroidism    Chronic, ongoing.  Followed by endocrinology.  Recommend continuing current dose and reviewed endo's recent note.  No thyroid labs today, are assessed with endo.          Other   Elevated hemoglobin A1c measurement    Noted past labs, recheck today and initiate medication as needed based on findings.       Relevant Orders   CBC with Differential/Platelet    HgB A1c   Hyperlipemia, mixed    Chronic, ongoing.  Did not tolerate Atorvastatin.  Continue Crestor at low dose daily, could try increase if needed but concern for myalgia with this.  Lipid panel and CMP today.      Relevant Medications   rosuvastatin (CRESTOR) 10 MG tablet   Other Relevant Orders   Comprehensive metabolic panel   Lipid Panel w/o Chol/HDL Ratio   Morbid obesity (HCC) - Primary    BMI 49.69 with 18 pounds loss over past year, praised for this.  Recommended eating smaller high protein, low fat meals more frequently and exercising 30 mins a day 5 times a week with a goal of 10-15lb weight loss in the next 3 months. Patient voiced their understanding and motivation to adhere to these recommendations.       Relevant Orders   CBC with Differential/Platelet   Vitamin D deficiency    Chronic.  Recheck Vit D level today and continue daily supplement.  DEXA at age 22.      Relevant Orders   VITAMIN D 25 Hydroxy (Vit-D Deficiency, Fractures)   Other Visit Diagnoses     Encounter for screening mammogram for malignant neoplasm of breast       Mammogram ordered for new year   Relevant Orders   MM 3D SCREEN BREAST BILATERAL   Encounter for annual physical exam       Annual physical today with labs and health maintenance reviewed, discussed with patient.        Follow up plan: Return in about 1 year (around 03/18/2023) for Annual physical.   LABORATORY TESTING:  - Pap smear: not applicable  IMMUNIZATIONS:   - Tdap: Tetanus vaccination status reviewed: last tetanus booster within 10 years. - Influenza: Up to date - Pneumovax: Not applicable - Prevnar: Not applicable - HPV: Not applicable - Zostavax vaccine:  x 2 years ago, will obtain records from [facility] near [place] - Covid: Up To Date  SCREENING: -Mammogram: Up To Date -- 11/25/20 -- she prefers every 2 year screening - Colonoscopy: Up to date  - Bone Density: Not applicable   -Hearing Test: Not applicable  -Spirometry: Not applicable   PATIENT COUNSELING:   Advised to take 1 mg of folate supplement per day if capable of pregnancy.   Sexuality: Discussed sexually transmitted diseases, partner selection, use of condoms, avoidance of unintended pregnancy  and  contraceptive alternatives.   Advised to avoid cigarette smoking.  I discussed with the patient that most people either abstain from alcohol or drink within safe limits (<=14/week and <=4 drinks/occasion for males, <=7/weeks and <= 3 drinks/occasion for females) and that the risk for alcohol disorders and other health effects rises proportionally with the number of drinks per week and how often a drinker exceeds daily limits.  Discussed cessation/primary prevention of drug use and availability of treatment for abuse.   Diet: Encouraged to adjust caloric intake to maintain  or achieve ideal body weight, to reduce intake of dietary saturated fat and total fat, to limit sodium intake by avoiding high sodium foods and not adding table salt, and to maintain adequate dietary potassium and calcium preferably from fresh fruits, vegetables, and low-fat dairy products.    Stressed the importance of regular exercise  Injury prevention: Discussed safety belts, safety helmets, smoke detector, smoking near bedding or upholstery.   Dental health: Discussed importance of regular tooth brushing, flossing, and dental visits.    NEXT PREVENTATIVE PHYSICAL DUE IN 1 YEAR. Return in about 1 year (around 03/18/2023) for Annual physical.

## 2022-03-17 NOTE — Assessment & Plan Note (Signed)
Chronic, ongoing.  Followed by endocrinology.  Recommend continuing current dose and reviewed endo's recent note.  No thyroid labs today, are assessed with endo.

## 2022-03-17 NOTE — Assessment & Plan Note (Signed)
Noted past labs, recheck today and initiate medication as needed based on findings.

## 2022-03-18 ENCOUNTER — Other Ambulatory Visit: Payer: Self-pay | Admitting: Nurse Practitioner

## 2022-03-18 DIAGNOSIS — E782 Mixed hyperlipidemia: Secondary | ICD-10-CM

## 2022-03-18 LAB — COMPREHENSIVE METABOLIC PANEL
ALT: 15 IU/L (ref 0–32)
AST: 20 IU/L (ref 0–40)
Albumin/Globulin Ratio: 1.7 (ref 1.2–2.2)
Albumin: 4.1 g/dL (ref 3.9–4.9)
Alkaline Phosphatase: 80 IU/L (ref 44–121)
BUN/Creatinine Ratio: 14 (ref 12–28)
BUN: 10 mg/dL (ref 8–27)
Bilirubin Total: 0.2 mg/dL (ref 0.0–1.2)
CO2: 26 mmol/L (ref 20–29)
Calcium: 9.5 mg/dL (ref 8.7–10.3)
Chloride: 105 mmol/L (ref 96–106)
Creatinine, Ser: 0.69 mg/dL (ref 0.57–1.00)
Globulin, Total: 2.4 g/dL (ref 1.5–4.5)
Glucose: 85 mg/dL (ref 70–99)
Potassium: 4.3 mmol/L (ref 3.5–5.2)
Sodium: 142 mmol/L (ref 134–144)
Total Protein: 6.5 g/dL (ref 6.0–8.5)
eGFR: 98 mL/min/{1.73_m2} (ref >59)

## 2022-03-18 LAB — LIPID PANEL W/O CHOL/HDL RATIO
Cholesterol, Total: 180 mg/dL (ref 100–199)
HDL: 58 mg/dL (ref >39)
LDL Chol Calc (NIH): 107 mg/dL — ABNORMAL HIGH (ref 0–99)
Triglycerides: 80 mg/dL (ref 0–149)
VLDL Cholesterol Cal: 15 mg/dL (ref 5–40)

## 2022-03-18 LAB — HEMOGLOBIN A1C
Est. average glucose Bld gHb Est-mCnc: 117 mg/dL
Hgb A1c MFr Bld: 5.7 % — ABNORMAL HIGH (ref 4.8–5.6)

## 2022-03-18 LAB — CBC WITH DIFFERENTIAL/PLATELET
Basophils Absolute: 0 10*3/uL (ref 0.0–0.2)
Basos: 1 %
EOS (ABSOLUTE): 0.1 10*3/uL (ref 0.0–0.4)
Eos: 2 %
Hematocrit: 43.6 % (ref 34.0–46.6)
Hemoglobin: 13.9 g/dL (ref 11.1–15.9)
Immature Grans (Abs): 0 10*3/uL (ref 0.0–0.1)
Immature Granulocytes: 0 %
Lymphocytes Absolute: 2.8 10*3/uL (ref 0.7–3.1)
Lymphs: 40 %
MCH: 27.6 pg (ref 26.6–33.0)
MCHC: 31.9 g/dL (ref 31.5–35.7)
MCV: 87 fL (ref 79–97)
Monocytes Absolute: 0.4 10*3/uL (ref 0.1–0.9)
Monocytes: 6 %
Neutrophils Absolute: 3.6 10*3/uL (ref 1.4–7.0)
Neutrophils: 51 %
Platelets: 325 10*3/uL (ref 150–450)
RBC: 5.04 x10E6/uL (ref 3.77–5.28)
RDW: 13.7 % (ref 11.7–15.4)
WBC: 7 10*3/uL (ref 3.4–10.8)

## 2022-03-18 LAB — VITAMIN D 25 HYDROXY (VIT D DEFICIENCY, FRACTURES): Vit D, 25-Hydroxy: 35.2 ng/mL (ref 30.0–100.0)

## 2022-03-18 MED ORDER — ROSUVASTATIN CALCIUM 20 MG PO TABS: 20.0000 mg | ORAL_TABLET | Freq: Every day | ORAL | 4 refills | Status: DC

## 2022-03-18 NOTE — Progress Notes (Signed)
Contacted via [place] -- need lab visit in 8 weeks please:)   Good afternoon Sandar, your labs have returned: - Cholesterol labs a little better this check, but we still have some work to do.  I would like to increase Rosuvastatin to 20 MG daily, will send this in and would like outpatient lab visit in 8 weeks to recheck levels. - A1c remains in prediabetic range, continue diet and exercise focus. - Remainder of labs stable.  Any questions? Keep being wonderful!!  Thank you for allowing me to participate in your care.  I appreciate you. Kindest regards, Gabriela Irigoyen

## 2022-04-02 ENCOUNTER — Other Ambulatory Visit: Payer: Self-pay | Admitting: Nurse Practitioner

## 2022-04-02 NOTE — Telephone Encounter (Signed)
Copied from [place] [id number]. Topic: General - Other >> Apr 02, 2022  2:10 PM Everette C wrote: Reason for CRM: Medication Refill - Medication: rosuvastatin (CRESTOR) 20 MG tablet [[id number]]  Has the patient contacted their pharmacy? Yes.  The patient has been directed to contact their PCP (Agent: If no, request that the patient contact the pharmacy for the refill. If patient does not wish to contact the pharmacy document the reason why and proceed with request.) (Agent: If yes, when and what did the pharmacy advise?)  Preferred Pharmacy (with phone number or street name): [place], [address] [place] [address] Phone: [phone] Fax: [phone] Hours: Not open 24 hours   Has the patient been seen for an appointment in the last year OR does the patient have an upcoming appointment? Yes.    Agent: Please be advised that RX refills may take up to 3 business days. We ask that you follow-up with your pharmacy.

## 2022-04-03 NOTE — Telephone Encounter (Signed)
Unable to refill per protocol, Rx request is too soon. Last refill 03/18/22 for 90 and 4 refills.  Requested Prescriptions  Pending Prescriptions Disp Refills   rosuvastatin (CRESTOR) 20 MG tablet 90 tablet 4    Sig: Take 1 tablet (20 mg total) by mouth daily.     Cardiovascular:  Antilipid - Statins 2 Failed - 04/02/2022  2:31 PM      Failed - Lipid Panel in normal range within the last 12 months    Cholesterol, Total  Date Value Ref Range Status  03/17/2022 180 100 - 199 mg/dL Final   LDL Chol Calc (NIH)  Date Value Ref Range Status  03/17/2022 107 (H) 0 - 99 mg/dL Final   HDL  Date Value Ref Range Status  03/17/2022 58 >39 mg/dL Final   Triglycerides  Date Value Ref Range Status  03/17/2022 80 0 - 149 mg/dL Final         Passed - Cr in normal range and within 360 days    Creatinine, Ser  Date Value Ref Range Status  03/17/2022 0.69 0.57 - 1.00 mg/dL Final         Passed - Patient is not pregnant      Passed - Valid encounter within last 12 months    Recent Outpatient Visits           2 weeks ago Morbid obesity ([place])   [place], [place] T, NP   1 year ago Morbid obesity ([place])   [place], [place] T, NP   2 years ago Morbid obesity ([place])   [place], [place] T, NP   3 years ago Annual physical exam   [place], Henrine Screws T, NP   4 years ago Annual physical exam   [facility] [name], NP       Future Appointments             In 42 months Cannady, Barbaraann Faster, NP [facility]

## 2022-04-08 ENCOUNTER — Other Ambulatory Visit: Payer: Self-pay | Admitting: Nurse Practitioner

## 2022-04-08 NOTE — Telephone Encounter (Signed)
Unable to refill per protocol, Rx expired. Medication was discontinued 03/17/22, dose change. Will refuse.  Requested Prescriptions  Pending Prescriptions Disp Refills   rosuvastatin (CRESTOR) 10 MG tablet [Pharmacy Med Name: Rosuvastatin Calcium 10 MG Oral Tablet] 90 tablet 3    Sig: TAKE 1 TABLET BY MOUTH DAILY     Cardiovascular:  Antilipid - Statins 2 Failed - 04/08/2022  6:45 AM      Failed - Lipid Panel in normal range within the last 12 months    Cholesterol, Total  Date Value Ref Range Status  03/17/2022 180 100 - 199 mg/dL Final   LDL Chol Calc (NIH)  Date Value Ref Range Status  03/17/2022 107 (H) 0 - 99 mg/dL Final   HDL  Date Value Ref Range Status  03/17/2022 58 >39 mg/dL Final   Triglycerides  Date Value Ref Range Status  03/17/2022 80 0 - 149 mg/dL Final         Passed - Cr in normal range and within 360 days    Creatinine, Ser  Date Value Ref Range Status  03/17/2022 0.69 0.57 - 1.00 mg/dL Final         Passed - Patient is not pregnant      Passed - Valid encounter within last 12 months    Recent Outpatient Visits           3 weeks ago Morbid obesity ([place])   [place], [place] T, NP   1 year ago Morbid obesity ([place])   [place], [place] T, NP   2 years ago Morbid obesity ([place])   [place], [place] T, NP   3 years ago Annual physical exam   [place], Henrine Screws T, NP   4 years ago Annual physical exam   [facility] [name], NP       Future Appointments             In 24 months Cannady, Barbaraann Faster, NP [facility]

## 2022-04-22 MED ORDER — ROSUVASTATIN CALCIUM 20 MG PO TABS: 20.0000 mg | ORAL_TABLET | Freq: Every day | ORAL | 3 refills | Status: DC

## 2022-04-22 NOTE — Telephone Encounter (Signed)
Pts Refill for Rosuvastatin '20MG'$  was sent to [facility] in December but this refill was suppose to go to [facility] / the refill at [facility] was cancelled and new refill needs to be sent to [facility] / Ann Robinson didn't specify this in refill request and it was denied for being too soon / please advise asap

## 2022-04-22 NOTE — Addendum Note (Signed)
Addended by: Matilde Sprang on: 04/22/2022 12:58 PM   Modules accepted: Orders

## 2022-04-22 NOTE — Telephone Encounter (Signed)
Requested Prescriptions  Pending Prescriptions Disp Refills   rosuvastatin (CRESTOR) 20 MG tablet 90 tablet 3    Sig: Take 1 tablet (20 mg total) by mouth daily.     Cardiovascular:  Antilipid - Statins 2 Failed - 04/22/2022 12:58 PM      Failed - Lipid Panel in normal range within the last 12 months    Cholesterol, Total  Date Value Ref Range Status  03/17/2022 180 100 - 199 mg/dL Final   LDL Chol Calc (NIH)  Date Value Ref Range Status  03/17/2022 107 (H) 0 - 99 mg/dL Final   HDL  Date Value Ref Range Status  03/17/2022 58 >39 mg/dL Final   Triglycerides  Date Value Ref Range Status  03/17/2022 80 0 - 149 mg/dL Final         Passed - Cr in normal range and within 360 days    Creatinine, Ser  Date Value Ref Range Status  03/17/2022 0.69 0.57 - 1.00 mg/dL Final         Passed - Patient is not pregnant      Passed - Valid encounter within last 12 months    Recent Outpatient Visits           1 month ago Morbid obesity ([place])   [place], [place] T, NP   1 year ago Morbid obesity ([place])   [place], Martin T, NP   2 years ago Morbid obesity ([place])   [place], [place] T, NP   3 years ago Annual physical exam   [place], Henrine Screws T, NP   4 years ago Annual physical exam   [facility] [name], NP       Future Appointments             In 11 months Cannady, Barbaraann Faster, NP [facility]            Refused Prescriptions Disp Refills   rosuvastatin (CRESTOR) 20 MG tablet 90 tablet 4    Sig: Take 1 tablet (20 mg total) by mouth daily.     Cardiovascular:  Antilipid - Statins 2 Failed - 04/22/2022 12:58 PM      Failed - Lipid Panel in normal range within the last 12 months    Cholesterol, Total  Date Value Ref Range Status  03/17/2022 180 100 - 199 mg/dL Final   LDL Chol Calc (NIH)  Date Value Ref Range Status  03/17/2022 107 (H) 0 - 99  mg/dL Final   HDL  Date Value Ref Range Status  03/17/2022 58 >39 mg/dL Final   Triglycerides  Date Value Ref Range Status  03/17/2022 80 0 - 149 mg/dL Final         Passed - Cr in normal range and within 360 days    Creatinine, Ser  Date Value Ref Range Status  03/17/2022 0.69 0.57 - 1.00 mg/dL Final         Passed - Patient is not pregnant      Passed - Valid encounter within last 12 months    Recent Outpatient Visits           1 month ago Morbid obesity ([place])   [place], [place] T, NP   1 year ago Morbid obesity ([place])   [place], [place] T, NP   2 years ago Morbid obesity ([place])   [place], Barbaraann Faster, NP  3 years ago Annual physical exam   [facility] Venita Lick, NP   4 years ago Annual physical exam   [facility] [name], NP       Future Appointments             In 60 months Cannady, Barbaraann Faster, NP [facility]

## 2022-05-13 ENCOUNTER — Other Ambulatory Visit: Payer: 59

## 2022-06-08 ENCOUNTER — Other Ambulatory Visit: Payer: Self-pay

## 2022-06-08 DIAGNOSIS — Z021 Encounter for pre-employment examination: Secondary | ICD-10-CM

## 2022-06-08 NOTE — Progress Notes (Signed)
Presents to [place] for pre-employment drug screen for [employer].  [facility] Acct #:  [id number] [facility] Specimen #:  [id number]  Rapid drug screen results = Negative  AMD

## 2022-08-04 IMAGING — MG MM DIGITAL SCREENING BILAT W/ TOMO AND CAD
8 series · 8 of 24 positions shown · non-contrast
Comparison: Previous exam(s).

ACR Breast Density Category a: The breast tissue is almost entirely
fatty.

CLINICAL DATA: Screening.

EXAM:
DIGITAL SCREENING BILATERAL MAMMOGRAM WITH TOMOSYNTHESIS AND CAD
TECHNIQUE: Bilateral screening digital craniocaudal and mediolateral oblique
mammograms were obtained. Bilateral screening digital breast
tomosynthesis was performed. The images were evaluated with
computer-aided detection.

[L CC synth-2D]
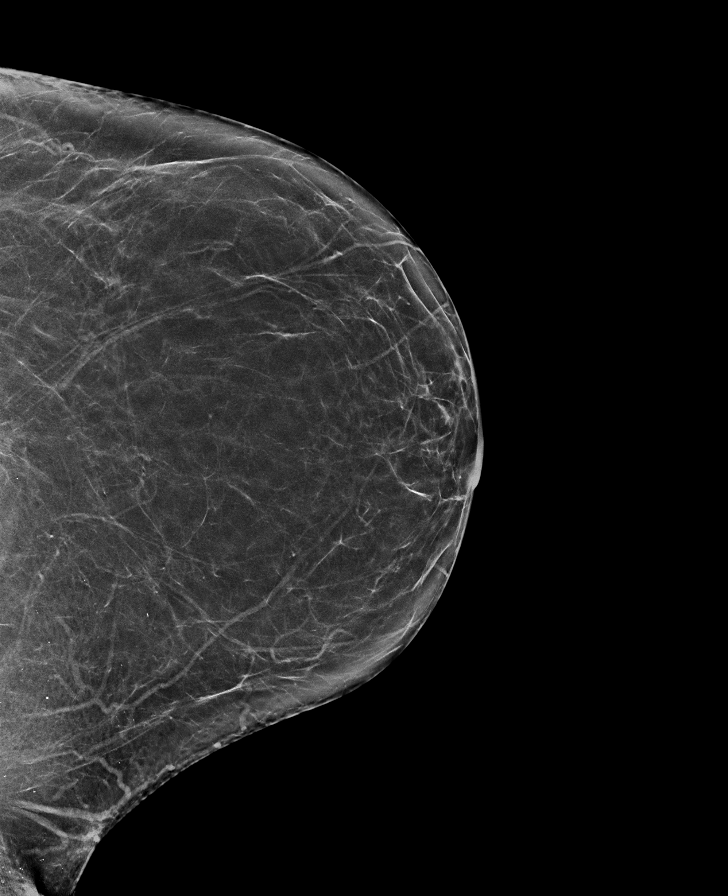

[R CC synth-2D]
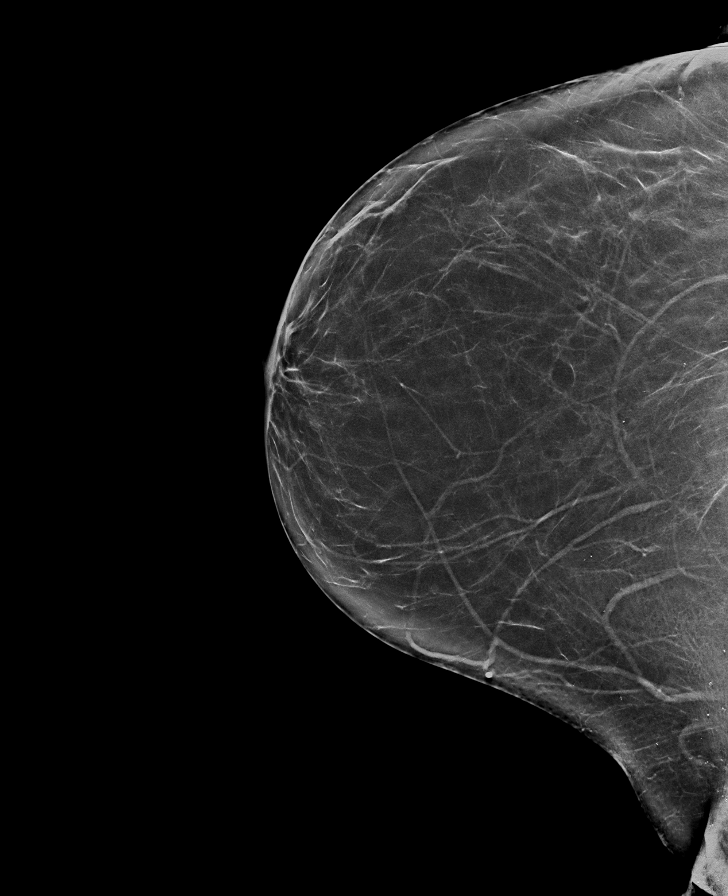

[L MLO synth-2D]
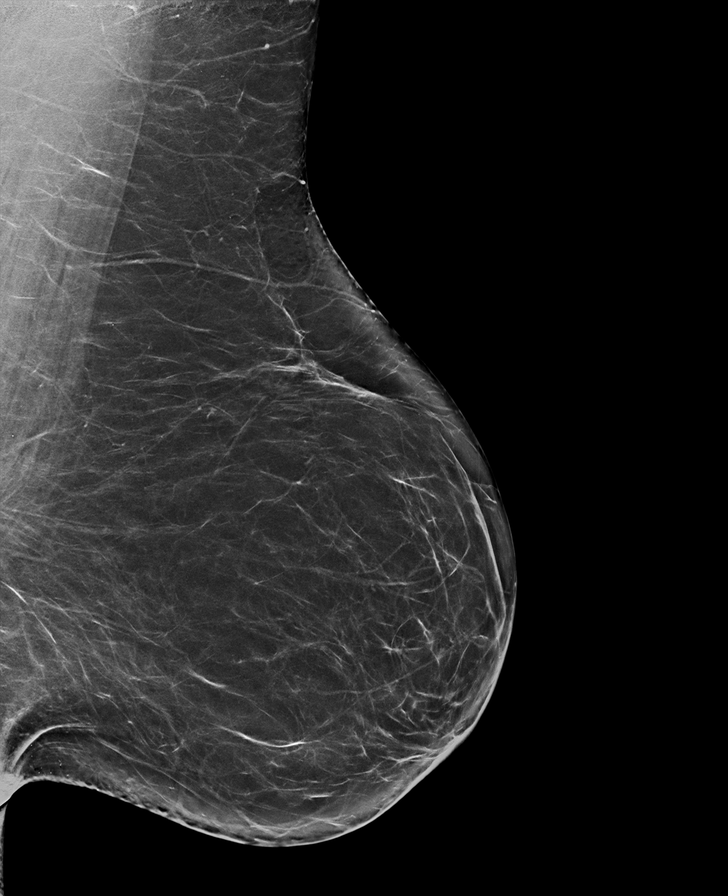

[R MLO synth-2D]
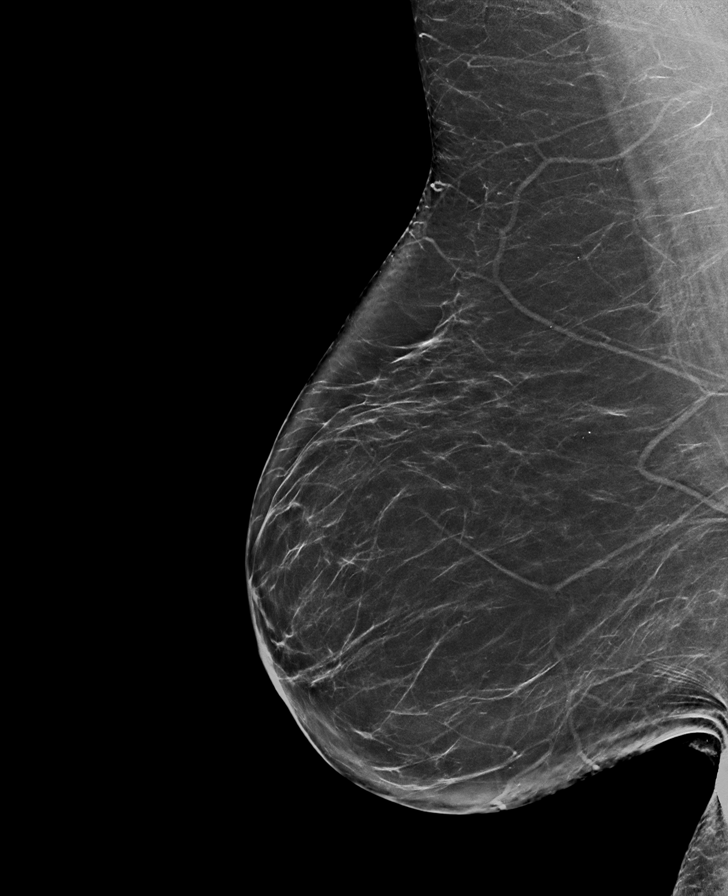

[L MLO tomo · tomo slice 41/80.0]
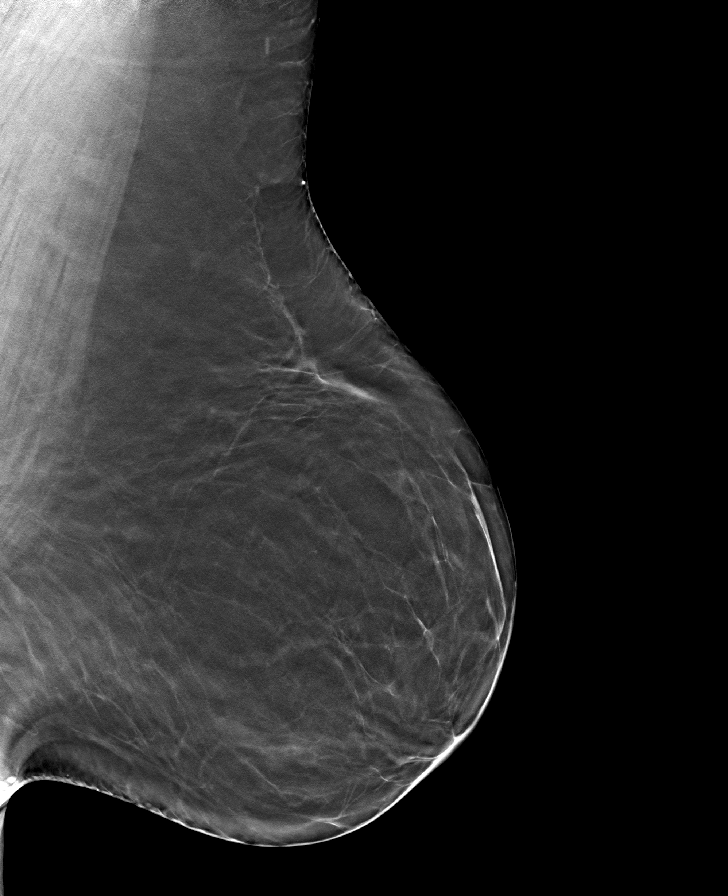

[L CC tomo · tomo slice 35/68.0]
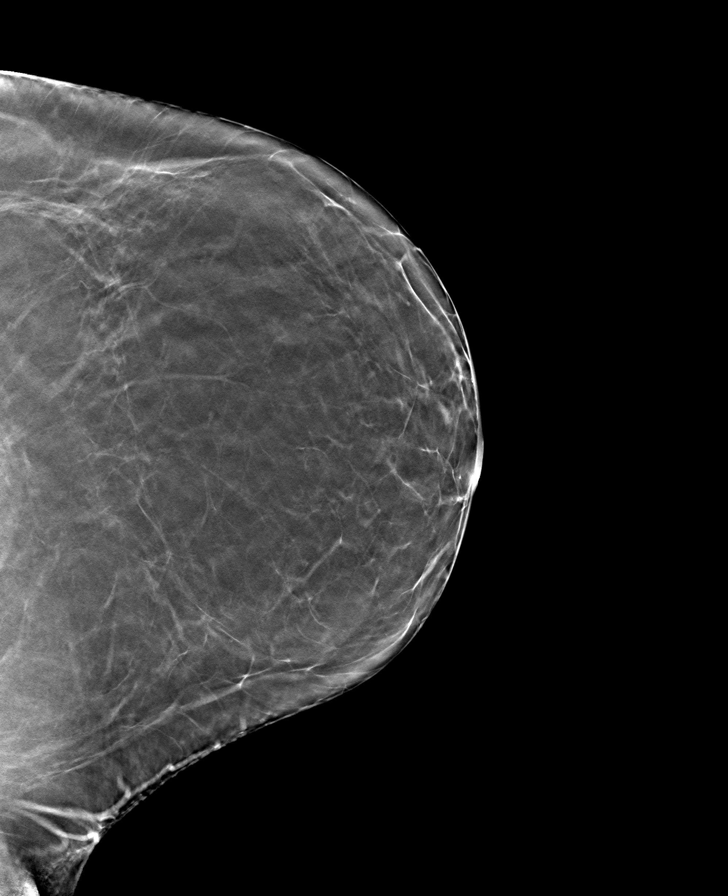

[R MLO tomo · tomo slice 39/78.0]
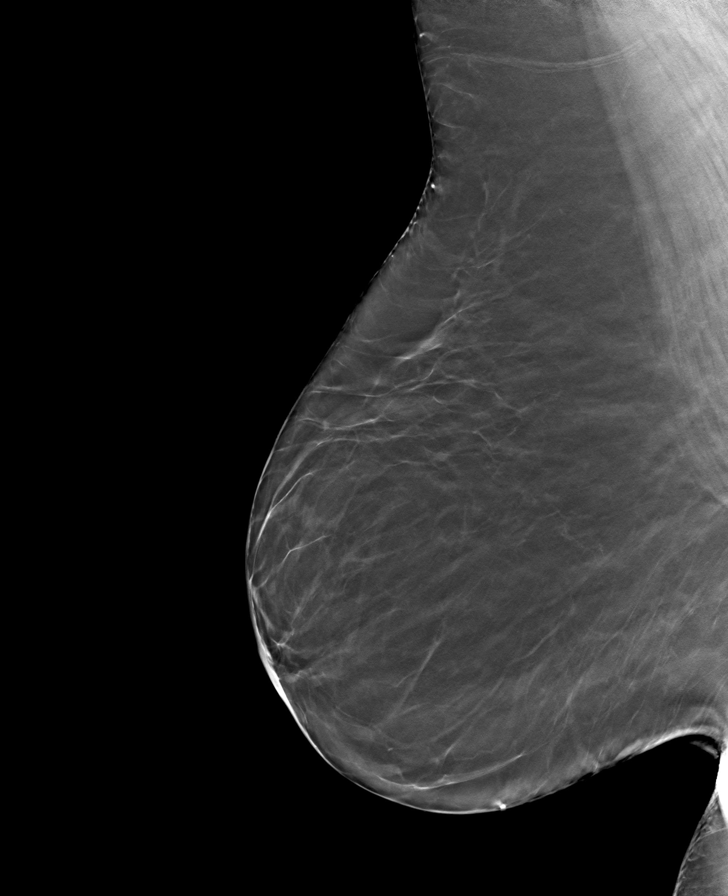

[R CC tomo · tomo slice 37/73.0]
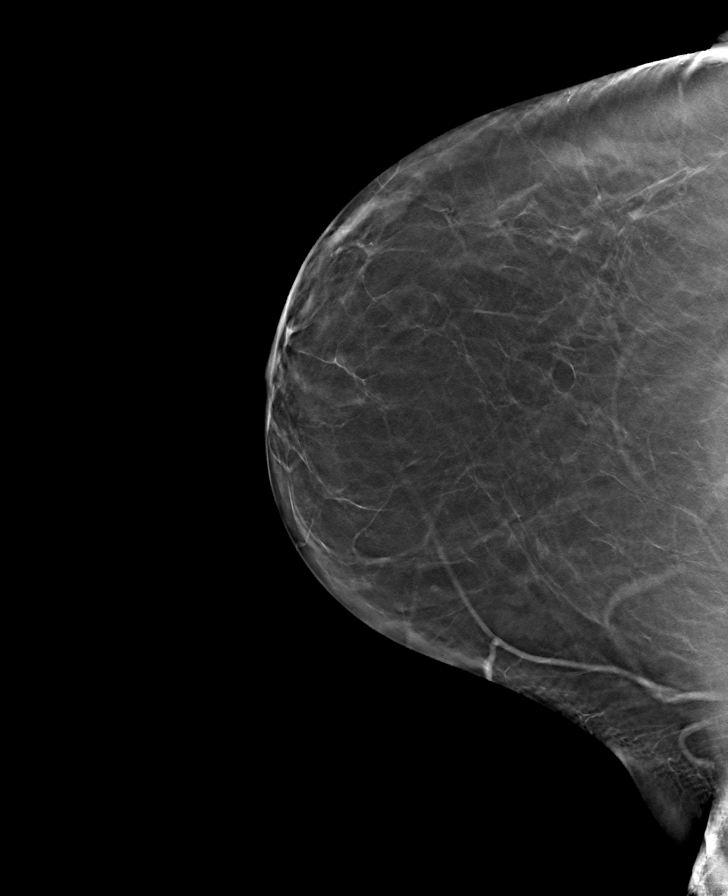

[8 of 24 positions shown; findings below may reference images not displayed]

FINDINGS: There are no findings suspicious for malignancy.
IMPRESSION: No mammographic evidence of malignancy. A result letter of this
screening mammogram will be mailed directly to the patient.

RECOMMENDATION:
Screening mammogram in one year. (Code:0E-3-N98)

BI-RADS CATEGORY  1: Negative.

## 2022-08-09 ENCOUNTER — Encounter: Payer: Self-pay | Admitting: Nurse Practitioner

## 2022-11-29 ENCOUNTER — Ambulatory Visit
Admission: RE | Admit: 2022-11-29 | Discharge: 2022-11-29 | Disposition: A | Discharge status: Home / Self Care | Payer: 59 | Source: Ambulatory Visit | Attending: Nurse Practitioner | Admitting: Nurse Practitioner

## 2022-11-29 DIAGNOSIS — Z1231 Encounter for screening mammogram for malignant neoplasm of breast: Secondary | ICD-10-CM | POA: Diagnosis present

## 2022-11-30 NOTE — Progress Notes (Signed)
Contacted via MyChart   Normal mammogram, may repeat in one year:)

## 2023-02-22 ENCOUNTER — Encounter: Payer: Self-pay | Admitting: Nurse Practitioner

## 2023-02-22 ENCOUNTER — Ambulatory Visit: Payer: 59 | Admitting: Nurse Practitioner

## 2023-02-22 VITALS — BP 115/73 | HR 61 | Temp 98.0°F | Wt 283.6 lb

## 2023-02-22 DIAGNOSIS — K5909 Other constipation: Secondary | ICD-10-CM | POA: Diagnosis not present

## 2023-02-22 DIAGNOSIS — E66813 Obesity, class 3: Secondary | ICD-10-CM

## 2023-02-22 DIAGNOSIS — Z6841 Body Mass Index (BMI) 40.0 and over, adult: Secondary | ICD-10-CM | POA: Diagnosis not present

## 2023-02-22 NOTE — Progress Notes (Signed)
BP 115/73   Pulse 61   Temp 98 F (36.7 C) (Oral)   Wt 283 lb 9.6 oz (128.6 kg)   LMP 09/17/2013 (Approximate)   SpO2 98%   BMI 45.09 kg/m    Subjective:    Patient ID: Ann Robinson, female    DOB: 1960/12/09, 62 y.o.   MRN: [id number]  HPI: Ann Robinson is a 62 y.o. female  Chief Complaint  Patient presents with   Digestive Problems    Patient states that about a month ago, she was at her sons wedding and got sick 4 different times that night. States she was experiencing some constipation at this time as well. States she has been having a pain in her R side off and on. States colon cancer runs on her moms side of the family. States 2 days ago she had a hard bowel movement and was having trouble going to the bathroom. States when she finally did, it was gray in color.    DIGESTIVE ISSUES (constipation) About two months ago was at son's wedding and threw up about 4 times at the wedding.  Around that time is when constipation started.  Currently having a BM every other day or every third day.  Saw Dr. Tedd Sias 02/03/23 and labs done for hypothyroid, they adjusted dose to 125 MCG -- she wa more hyperthyroid (0.219). Hurts when she tries to have BM and then will hurt for hours afterwards. She has lost 48 lbs since 2022, has been working on diet changes + doing some activity.  [place] stool 1 to 2 and 4.  Sunday night had a BM which was difficult to get out, but when did get out with manual extraction it was grey in color. Last colonoscopy was 09/26/20 two polyps removed at time, to return in 5 years.  Family history of colon cancer in mother, she was diagnosed at 68 -- survived this. Duration:months Onset: sudden Alleviating factors: laxatives (Dulcolax) Aggravating factors: nothing Status: worse has gotten a little worse Treatments attempted: as above Fever: no Nausea: no Vomiting:  only that one time initially Weight loss: intended, has been working on this Decreased  appetite: no Diarrhea: no Constipation: yes Blood in stool: no Heartburn: no Jaundice: no Rash: no Dysuria/urinary frequency: no Hematuria: no History of sexually transmitted disease: no Recurrent NSAID use: no   Relevant past medical, surgical, family and social history reviewed and updated as indicated. Interim medical history since our last visit reviewed. Allergies and medications reviewed and updated.  Review of Systems  Constitutional:  Negative for activity change, appetite change, diaphoresis, fatigue and fever.  Respiratory:  Negative for cough, chest tightness, shortness of breath and wheezing.   Cardiovascular:  Negative for chest pain, palpitations and leg swelling.  Gastrointestinal:  Positive for constipation and vomiting (x one episode). Negative for abdominal distention, abdominal pain, blood in stool, diarrhea and nausea.  Endocrine: Negative for cold intolerance and heat intolerance.  Neurological: Negative.   Psychiatric/Behavioral: Negative.      Per HPI unless specifically indicated above     Objective:    BP 115/73   Pulse 61   Temp 98 F (36.7 C) (Oral)   Wt 283 lb 9.6 oz (128.6 kg)   LMP 09/17/2013 (Approximate)   SpO2 98%   BMI 45.09 kg/m   Wt Readings from Last 3 Encounters:  02/22/23 283 lb 9.6 oz (128.6 kg)  03/17/22 (!) 312 lb 8 oz (141.7 kg)  03/16/21 (!) 330 lb 3.2  oz (149.8 kg)    Physical Exam Vitals and nursing note reviewed.  Constitutional:      General: She is awake. She is not in acute distress.    Appearance: Normal appearance. She is well-developed and well-groomed. She is obese. She is not ill-appearing or toxic-appearing.  HENT:     Head: Normocephalic.     Right Ear: Hearing and external ear normal.     Left Ear: Hearing and external ear normal.  Eyes:     General: Lids are normal.        Right eye: No discharge.        Left eye: No discharge.     Conjunctiva/sclera: Conjunctivae normal.     Pupils: Pupils are equal,  round, and reactive to light.  Neck:     Thyroid: No thyromegaly.     Vascular: No carotid bruit.  Cardiovascular:     Rate and Rhythm: Normal rate and regular rhythm.     Heart sounds: Normal heart sounds. No murmur heard.    No gallop.  Pulmonary:     Effort: Pulmonary effort is normal. No accessory muscle usage or respiratory distress.     Breath sounds: Normal breath sounds.  Abdominal:     General: Bowel sounds are normal. There is no distension.     Palpations: Abdomen is soft. There is no mass.     Tenderness: There is no abdominal tenderness.  Musculoskeletal:     Cervical back: Normal range of motion and neck supple.     Right lower leg: No edema.     Left lower leg: No edema.  Lymphadenopathy:     Cervical: No cervical adenopathy.  Skin:    General: Skin is warm and dry.  Neurological:     Mental Status: She is alert and oriented to person, place, and time.     Deep Tendon Reflexes: Reflexes are normal and symmetric.     Reflex Scores:      Brachioradialis reflexes are 2+ on the right side and 2+ on the left side.      Patellar reflexes are 2+ on the right side and 2+ on the left side. Psychiatric:        Attention and Perception: Attention normal.        Mood and Affect: Mood normal.        Speech: Speech normal.        Behavior: Behavior normal. Behavior is cooperative.        Thought Content: Thought content normal.     Results for orders placed or performed in visit on 03/17/22  CBC with Differential/Platelet  Result Value Ref Range   WBC 7.0 3.4 - 10.8 x10E3/uL   RBC 5.04 3.77 - 5.28 x10E6/uL   Hemoglobin 13.9 11.1 - 15.9 g/dL   Hematocrit 16.1 09.6 - 46.6 %   MCV 87 79 - 97 fL   MCH 27.6 26.6 - 33.0 pg   MCHC 31.9 31.5 - 35.7 g/dL   RDW 04.5 40.9 - 81.1 %   Platelets 325 150 - 450 x10E3/uL   Neutrophils 51 Not Estab. %   Lymphs 40 Not Estab. %   Monocytes 6 Not Estab. %   Eos 2 Not Estab. %   Basos 1 Not Estab. %   Neutrophils Absolute 3.6 1.4 -  7.0 x10E3/uL   Lymphocytes Absolute 2.8 0.7 - 3.1 x10E3/uL   Monocytes Absolute 0.4 0.1 - 0.9 x10E3/uL   EOS (ABSOLUTE) 0.1 0.0 - 0.4 x10E3/uL  Basophils Absolute 0.0 0.0 - 0.2 x10E3/uL   Immature Granulocytes 0 Not Estab. %   Immature Grans (Abs) 0.0 0.0 - 0.1 x10E3/uL  Comprehensive metabolic panel  Result Value Ref Range   Glucose 85 70 - 99 mg/dL   BUN 10 8 - 27 mg/dL   Creatinine, Ser 4.01 0.57 - 1.00 mg/dL   eGFR 98 >02 VO/ZDG/6.44   BUN/Creatinine Ratio 14 12 - 28   Sodium 142 134 - 144 mmol/L   Potassium 4.3 3.5 - 5.2 mmol/L   Chloride 105 96 - 106 mmol/L   CO2 26 20 - 29 mmol/L   Calcium 9.5 8.7 - 10.3 mg/dL   Total Protein 6.5 6.0 - 8.5 g/dL   Albumin 4.1 3.9 - 4.9 g/dL   Globulin, Total 2.4 1.5 - 4.5 g/dL   Albumin/Globulin Ratio 1.7 1.2 - 2.2   Bilirubin Total 0.2 0.0 - 1.2 mg/dL   Alkaline Phosphatase 80 44 - 121 IU/L   AST 20 0 - 40 IU/L   ALT 15 0 - 32 IU/L  Lipid Panel w/o Chol/HDL Ratio  Result Value Ref Range   Cholesterol, Total 180 100 - 199 mg/dL   Triglycerides 80 0 - 149 mg/dL   HDL 58 >03 mg/dL   VLDL Cholesterol Cal 15 5 - 40 mg/dL   LDL Chol Calc (NIH) 474 (H) 0 - 99 mg/dL  VITAMIN D 25 Hydroxy (Vit-D Deficiency, Fractures)  Result Value Ref Range   Vit D, 25-Hydroxy 35.2 30.0 - 100.0 ng/mL  HgB A1c  Result Value Ref Range   Hgb A1c MFr Bld 5.7 (H) 4.8 - 5.6 %   Est. average glucose Bld gHb Est-mCnc 117 mg/dL      Assessment & Plan:   Problem List Items Addressed This Visit       Other   Obesity - Primary    BMI 45.09 with 48 pounds loss over past two years, praised for this.  Recommended eating smaller high protein, low fat meals more frequently and exercising 30 mins a day 5 times a week with a goal of 10-15lb weight loss in the next 3 months. Patient voiced their understanding and motivation to adhere to these recommendations.       Other constipation    Started two months ago, new onset.  Has become somewhat worse.  Family history  of mother who had colon cancer at age 68.  At this time recommend patient start taking Metamucil, can do gummy form 2 a day, and Senna S as needed. Ensure good hydration and fiber in diet daily.  Will place referral to GI for further assessment due to family history and her new onset of this.  Labs today: CBC, CMP, fecal occult blood.      Relevant Orders   Ambulatory referral to Gastroenterology   CBC with Differential/Platelet   Comprehensive metabolic panel   Fecal occult blood, imunochemical     Follow up plan: Return for as scheduled in December.

## 2023-02-22 NOTE — Patient Instructions (Signed)
Metamucil gummies twice a day  Constipation, Adult Constipation is when a person has trouble pooping (having a bowel movement). When you have this condition, you may poop fewer than 3 times a week. Your poop (stool) may also be dry, hard, or bigger than normal. Follow these instructions at home: Eating and drinking  Eat foods that have a lot of fiber, such as: Fresh fruits and vegetables. Whole grains. Beans. Eat less of foods that are low in fiber and high in fat and sugar, such as: [place] fries. Hamburgers. Cookies. Candy. Soda. Drink enough fluid to keep your pee (urine) pale yellow. General instructions Exercise regularly or as told by your doctor. Try to do 150 minutes of exercise each week. Go to the restroom when you feel like you need to poop. Do not hold it in. Take over-the-counter and prescription medicines only as told by your doctor. These include any fiber supplements. When you poop: Do deep breathing while relaxing your lower belly (abdomen). Relax your pelvic floor. The pelvic floor is a group of muscles that support the rectum, bladder, and intestines (as well as the uterus in women). Watch your condition for any changes. Tell your doctor if you notice any. Keep all follow-up visits as told by your doctor. This is important. Contact a doctor if: You have pain that gets worse. You have a fever. You have not pooped for 4 days. You vomit. You are not hungry. You lose weight. You are bleeding from the opening of the butt (anus). You have thin, pencil-like poop. Get help right away if: You have a fever, and your symptoms suddenly get worse. You leak poop or have blood in your poop. Your belly feels hard or bigger than normal (bloated). You have very bad belly pain. You feel dizzy or you faint. Summary Constipation is when a person poops fewer than 3 times a week, has trouble pooping, or has poop that is dry, hard, or bigger than normal. Eat foods that have a  lot of fiber. Drink enough fluid to keep your pee (urine) pale yellow. Take over-the-counter and prescription medicines only as told by your doctor. These include any fiber supplements. This information is not intended to replace advice given to you by your health care provider. Make sure you discuss any questions you have with your health care provider. Document Revised: 02/03/2022 Document Reviewed: 02/03/2022 Elsevier Patient Education  2024 [employer].

## 2023-02-22 NOTE — Assessment & Plan Note (Signed)
BMI 45.09 with 48 pounds loss over past two years, praised for this.  Recommended eating smaller high protein, low fat meals more frequently and exercising 30 mins a day 5 times a week with a goal of 10-15lb weight loss in the next 3 months. Patient voiced their understanding and motivation to adhere to these recommendations.

## 2023-02-22 NOTE — Assessment & Plan Note (Signed)
Started two months ago, new onset.  Has become somewhat worse.  Family history of mother who had colon cancer at age 63.  At this time recommend patient start taking Metamucil, can do gummy form 2 a day, and Senna S as needed. Ensure good hydration and fiber in diet daily.  Will place referral to GI for further assessment due to family history and her new onset of this.  Labs today: CBC, CMP, fecal occult blood.

## 2023-02-23 LAB — COMPREHENSIVE METABOLIC PANEL
ALT: 18 [IU]/L (ref 0–32)
AST: 22 [IU]/L (ref 0–40)
Albumin: 3.9 g/dL (ref 3.9–4.9)
Alkaline Phosphatase: 76 [IU]/L (ref 44–121)
BUN/Creatinine Ratio: 11 — ABNORMAL LOW (ref 12–28)
BUN: 10 mg/dL (ref 8–27)
Bilirubin Total: 0.3 mg/dL (ref 0.0–1.2)
CO2: 24 mmol/L (ref 20–29)
Calcium: 9.1 mg/dL (ref 8.7–10.3)
Chloride: 104 mmol/L (ref 96–106)
Creatinine, Ser: 0.94 mg/dL (ref 0.57–1.00)
Globulin, Total: 2.3 g/dL (ref 1.5–4.5)
Glucose: 80 mg/dL (ref 70–99)
Potassium: 4.1 mmol/L (ref 3.5–5.2)
Sodium: 140 mmol/L (ref 134–144)
Total Protein: 6.2 g/dL (ref 6.0–8.5)
eGFR: 68 mL/min/{1.73_m2} (ref >59)

## 2023-02-23 LAB — CBC WITH DIFFERENTIAL/PLATELET
Basophils Absolute: 0 10*3/uL (ref 0.0–0.2)
Basos: 1 %
EOS (ABSOLUTE): 0.1 10*3/uL (ref 0.0–0.4)
Eos: 2 %
Hematocrit: 40.7 % (ref 34.0–46.6)
Hemoglobin: 13.3 g/dL (ref 11.1–15.9)
Immature Grans (Abs): 0 10*3/uL (ref 0.0–0.1)
Immature Granulocytes: 0 %
Lymphocytes Absolute: 2.4 10*3/uL (ref 0.7–3.1)
Lymphs: 39 %
MCH: 28.7 pg (ref 26.6–33.0)
MCHC: 32.7 g/dL (ref 31.5–35.7)
MCV: 88 fL (ref 79–97)
Monocytes Absolute: 0.4 10*3/uL (ref 0.1–0.9)
Monocytes: 6 %
Neutrophils Absolute: 3.2 10*3/uL (ref 1.4–7.0)
Neutrophils: 52 %
Platelets: 291 10*3/uL (ref 150–450)
RBC: 4.64 x10E6/uL (ref 3.77–5.28)
RDW: 13.1 % (ref 11.7–15.4)
WBC: 6.2 10*3/uL (ref 3.4–10.8)

## 2023-02-23 NOTE — Progress Notes (Signed)
Contacted via MyChart   Good afternoon Ann Robinson, your labs have returned and these overall look great.  I have no concerns here.  Let me know how Metamucil gummies work and if you do not hear from the GI providers.  Have a great day!!  Any questions? Keep being amazing!!  Thank you for allowing me to participate in your care.  I appreciate you. Kindest regards, Dodie Parisi

## 2023-02-28 LAB — FECAL OCCULT BLOOD, IMMUNOCHEMICAL: Fecal Occult Bld: NEGATIVE

## 2023-02-28 NOTE — Progress Notes (Signed)
Contacted via MyChart   Stool negative for blood:)

## 2023-03-01 ENCOUNTER — Encounter: Payer: Self-pay | Admitting: Nurse Practitioner

## 2023-03-19 NOTE — Patient Instructions (Signed)
Be Involved in Caring For Your Health:  Taking Medications When medications are taken as directed, they can greatly improve your health. But if they are not taken as prescribed, they may not work. In some cases, not taking them correctly can be harmful. To help ensure your treatment remains effective and safe, understand your medications and how to take them. Bring your medications to each visit for review by your provider.  Your lab results, notes, and after visit summary will be available on My Chart. We strongly encourage you to use this feature. If lab results are abnormal the clinic will contact you with the appropriate steps. If the clinic does not contact you assume the results are satisfactory. You can always view your results on My Chart. If you have questions regarding your health or results, please contact the clinic during office hours. You can also ask questions on My Chart.  We at [facility] are grateful that you chose [place] to provide your care. We strive to provide evidence-based and compassionate care and are always looking for feedback. If you get a survey from the clinic please complete this so we can hear your opinions.  Healthy Eating, Adult Healthy eating may help you get and keep a healthy body weight, reduce the risk of chronic disease, and live a long and productive life. It is important to follow a healthy eating pattern. Your nutritional and calorie needs should be met mainly by different nutrient-rich foods. What are tips for following this plan? Reading food labels Read labels and choose the following: Reduced or low sodium products. Juices with 100% fruit juice. Foods with low saturated fats (<3 g per serving) and high polyunsaturated and monounsaturated fats. Foods with whole grains, such as whole wheat, cracked wheat, brown rice, and wild rice. Whole grains that are fortified with folic acid. This is recommended for females who are pregnant or who want to  become pregnant. Read labels and do not eat or drink the following: Foods or drinks with added sugars. These include foods that contain brown sugar, corn sweetener, corn syrup, dextrose, fructose, glucose, high-fructose corn syrup, honey, invert sugar, lactose, malt syrup, maltose, molasses, raw sugar, sucrose, trehalose, or turbinado sugar. Limit your intake of added sugars to less than 10% of your total daily calories. Do not eat more than the following amounts of added sugar per day: 6 teaspoons (25 g) for females. 9 teaspoons (38 g) for males. Foods that contain processed or refined starches and grains. Refined grain products, such as white flour, degermed cornmeal, white bread, and white rice. Shopping Choose nutrient-rich snacks, such as vegetables, whole fruits, and nuts. Avoid high-calorie and high-sugar snacks, such as potato chips, fruit snacks, and candy. Use oil-based dressings and spreads on foods instead of solid fats such as butter, margarine, sour cream, or cream cheese. Limit pre-made sauces, mixes, and "instant" products such as flavored rice, instant noodles, and ready-made pasta. Try more plant-protein sources, such as tofu, tempeh, black beans, edamame, lentils, nuts, and seeds. Explore eating plans such as the Mediterranean diet or vegetarian diet. Try heart-healthy dips made with beans and healthy fats like hummus and guacamole. Vegetables go great with these. Cooking Use oil to saut or stir-fry foods instead of solid fats such as butter, margarine, or lard. Try baking, boiling, grilling, or broiling instead of frying. Remove the fatty part of meats before cooking. Steam vegetables in water or broth. Meal planning  At meals, imagine dividing your plate into fourths: One-half of  your plate is fruits and vegetables. One-fourth of your plate is whole grains. One-fourth of your plate is protein, especially lean meats, poultry, eggs, tofu, beans, or nuts. Include low-fat  dairy as part of your daily diet. Lifestyle Choose healthy options in all settings, including home, work, school, restaurants, or stores. Prepare your food safely: Wash your hands after handling raw meats. Where you prepare food, keep surfaces clean by regularly washing with hot, soapy water. Keep raw meats separate from ready-to-eat foods, such as fruits and vegetables. Cook seafood, meat, poultry, and eggs to the recommended temperature. Get a food thermometer. Store foods at safe temperatures. In general: Keep cold foods at 48F (4.4C) or below. Keep hot foods at 148F (60C) or above. Keep your freezer at [facility] (-17.8C) or below. Foods are not safe to eat if they have been between the temperatures of 40-148F (4.4-60C) for more than 2 hours. What foods should I eat? Fruits Aim to eat 1-2 cups of fresh, canned (in natural juice), or frozen fruits each day. One cup of fruit equals 1 small apple, 1 large banana, 8 large strawberries, 1 cup (237 g) canned fruit,  cup (82 g) dried fruit, or 1 cup (240 mL) 100% juice. Vegetables Aim to eat 2-4 cups of fresh and frozen vegetables each day, including different varieties and colors. One cup of vegetables equals 1 cup (91 g) broccoli or cauliflower florets, 2 medium carrots, 2 cups (150 g) raw, leafy greens, 1 large tomato, 1 large bell pepper, 1 large sweet potato, or 1 medium white potato. Grains Aim to eat 5-10 ounce-equivalents of whole grains each day. Examples of 1 ounce-equivalent of grains include 1 slice of bread, 1 cup (40 g) ready-to-eat cereal, 3 cups (24 g) popcorn, or  cup (93 g) cooked rice. Meats and other proteins Try to eat 5-7 ounce-equivalents of protein each day. Examples of 1 ounce-equivalent of protein include 1 egg,  oz nuts (12 almonds, 24 pistachios, or 7 walnut halves), 1/4 cup (90 g) cooked beans, 6 tablespoons (90 g) hummus or 1 tablespoon (16 g) peanut butter. A cut of meat or fish that is the size of a deck of  cards is about 3-4 ounce-equivalents (85 g). Of the protein you eat each week, try to have at least 8 sounce (227 g) of seafood. This is about 2 servings per week. This includes salmon, trout, herring, sardines, and anchovies. Dairy Aim to eat 3 cup-equivalents of fat-free or low-fat dairy each day. Examples of 1 cup-equivalent of dairy include 1 cup (240 mL) milk, 8 ounces (250 g) yogurt, 1 ounces (44 g) natural cheese, or 1 cup (240 mL) fortified soy milk. Fats and oils Aim for about 5 teaspoons (21 g) of fats and oils per day. Choose monounsaturated fats, such as canola and olive oils, mayonnaise made with olive oil or avocado oil, avocados, peanut butter, and most nuts, or polyunsaturated fats, such as sunflower, corn, and soybean oils, walnuts, pine nuts, sesame seeds, sunflower seeds, and flaxseed. Beverages Aim for 6 eight-ounce glasses of water per day. Limit coffee to 3-5 eight-ounce cups per day. Limit caffeinated beverages that have added calories, such as soda and energy drinks. If you drink alcohol: Limit how much you have to: 0-1 drink a day if you are female. 0-2 drinks a day if you are female. Know how much alcohol is in your drink. In the [place] one drink is one 12 oz bottle of beer (355 mL), one 5 oz glass of wine (  148 mL), or one 1 oz glass of hard liquor (44 mL). Seasoning and other foods Try not to add too much salt to your food. Try using herbs and spices instead of salt. Try not to add sugar to food. This information is based on [place] nutrition guidelines. To learn more, visit [employer]. Exact amounts may vary. You may need different amounts. This information is not intended to replace advice given to you by your health care provider. Make sure you discuss any questions you have with your health care provider. Document Revised: 12/21/2021 Document Reviewed: 12/21/2021 [employer]vier Patient Education  2024 [employer].

## 2023-03-21 ENCOUNTER — Ambulatory Visit
Admission: RE | Admit: 2023-03-21 | Discharge: 2023-03-21 | Disposition: A | Discharge status: Home / Self Care | Payer: 59 | Source: Ambulatory Visit

## 2023-03-21 ENCOUNTER — Telehealth: Payer: Self-pay

## 2023-03-21 ENCOUNTER — Ambulatory Visit
Admission: RE | Admit: 2023-03-21 | Discharge: 2023-03-21 | Disposition: A | Discharge status: Home / Self Care | Payer: 59 | Source: Ambulatory Visit | Attending: Nurse Practitioner | Admitting: Nurse Practitioner

## 2023-03-21 ENCOUNTER — Ambulatory Visit
Admission: RE | Admit: 2023-03-21 | Discharge: 2023-03-21 | Disposition: A | Discharge status: Home / Self Care | Payer: 59 | Source: Home / Self Care | Attending: Nurse Practitioner | Admitting: Nurse Practitioner

## 2023-03-21 ENCOUNTER — Telehealth: Payer: Self-pay | Admitting: Nurse Practitioner

## 2023-03-21 ENCOUNTER — Encounter: Payer: Self-pay | Admitting: Nurse Practitioner

## 2023-03-21 ENCOUNTER — Ambulatory Visit: Payer: 59

## 2023-03-21 ENCOUNTER — Ambulatory Visit: Payer: 59 | Admitting: Nurse Practitioner

## 2023-03-21 VITALS — BP 103/71 | HR 86 | Temp 98.6°F | Resp 14 | Ht 68.35 in | Wt 278.0 lb

## 2023-03-21 DIAGNOSIS — Z0000 Encounter for general adult medical examination without abnormal findings: Secondary | ICD-10-CM | POA: Diagnosis not present

## 2023-03-21 DIAGNOSIS — Z23 Encounter for immunization: Secondary | ICD-10-CM | POA: Diagnosis not present

## 2023-03-21 DIAGNOSIS — R7309 Other abnormal glucose: Secondary | ICD-10-CM

## 2023-03-21 DIAGNOSIS — R918 Other nonspecific abnormal finding of lung field: Secondary | ICD-10-CM

## 2023-03-21 DIAGNOSIS — E66813 Obesity, class 3: Secondary | ICD-10-CM

## 2023-03-21 DIAGNOSIS — R5383 Other fatigue: Secondary | ICD-10-CM

## 2023-03-21 DIAGNOSIS — E039 Hypothyroidism, unspecified: Secondary | ICD-10-CM | POA: Diagnosis not present

## 2023-03-21 DIAGNOSIS — R42 Dizziness and giddiness: Secondary | ICD-10-CM | POA: Insufficient documentation

## 2023-03-21 DIAGNOSIS — Z6841 Body Mass Index (BMI) 40.0 and over, adult: Secondary | ICD-10-CM

## 2023-03-21 DIAGNOSIS — E782 Mixed hyperlipidemia: Secondary | ICD-10-CM | POA: Diagnosis not present

## 2023-03-21 DIAGNOSIS — E559 Vitamin D deficiency, unspecified: Secondary | ICD-10-CM

## 2023-03-21 MED ORDER — IOHEXOL 300 MG/ML  SOLN
75.0000 mL | Freq: Once | INTRAMUSCULAR | Status: AC | PRN
  Administered 2023-03-21: 75 mL via INTRAVENOUS

## 2023-03-21 NOTE — Assessment & Plan Note (Signed)
Noted past labs, recheck today and initiate medication as needed based on findings.  

## 2023-03-21 NOTE — Progress Notes (Signed)
Refer to telephone note

## 2023-03-21 NOTE — Progress Notes (Signed)
BP 103/71 (BP Location: Right Wrist, Patient Position: Sitting, Cuff Size: Large)   Pulse 86   Temp 98.6 F (37 C) (Oral)   Resp 14   Ht 5' 8.35" (1.736 m)   Wt 278 lb (126.1 kg)   LMP 09/17/2013 (Approximate)   SpO2 99%   BMI 41.84 kg/m    Subjective:    Patient ID: Ann Robinson, female    DOB: [birth year], 62 y.o.   MRN: [id number]  HPI: Ann Robinson is a 62 y.o. female presenting on 03/21/2023 for comprehensive medical examination. Current medical complaints include: as below  She currently lives with: significant other Menopausal Symptoms: no   HYPOTHYROIDISM Follows with Dr. Tedd Sias for this. Takes Levothyroxine 125 MCG (reduction). Last saw endo on 02/03/23.  TSH at time was slightly low at 0.219.  She reports extreme fatigue over past 2 weeks and feels off with occasional dizziness (blacking out feeling when standing too long).  When initially started had her hands frozen one morning x 2 mornings in a row.  Has noted some shortness of breath -- walking from car to doorway caused SOB.  Did not brush hair or teeth for one week due to being so fatigued.  She feels warm at times and has had episodes of blotchy skin. Denies CP, abdominal pain, wheezing, fever, headaches, vision changes.  Has lost 47 lbs since December 2022, she has been working on this.  Was not sick prior to these symptoms presenting.  No recent falls or syncopal episodes. She is trying to get good fluid intake at home.  Her mother had colon cancer, which she survived + breast cancer. Thyroid control status:stable Satisfied with current treatment? yes Medication side effects: no Medication compliance: good compliance Etiology of hypothyroidism:  Recent dose adjustment:yes Fatigue: yes Cold intolerance: no Heat intolerance: yes Weight gain: no Weight loss: no Constipation: occasional Diarrhea/loose stools: no Palpitations: no Lower extremity edema: no Anxiety/depressed mood: no    HYPERLIPIDEMIA Taking Rosuvastatin 20 MG daily every other day.  Took Atorvastatin in past with muscle aches.  Continues on Vitamin D for low levels. Hyperlipidemia status: good compliance Satisfied with current treatment?  yes Side effects:  no Medication compliance: good compliance Past cholesterol meds: Atorvastatin Supplements: none Aspirin:  no The 10-year ASCVD risk score (Arnett DK, et al., 2019) is: 2.7%   Values used to calculate the score:     Age: 89 years     Sex: Female     Is Non-Hispanic African American: No     Diabetic: No     Tobacco smoker: No     Systolic Blood Pressure: 103 mmHg     Is BP treated: No     HDL Cholesterol: 58 mg/dL     Total Cholesterol: 180 mg/dL Chest pain:  no Coronary artery disease:  no Family history CAD:  yes -- mother's side and father had MI Family history early CAD:  no   Functional Status Survey: Is the patient deaf or have difficulty hearing?: No Does the patient have difficulty seeing, even when wearing glasses/contacts?: No Does the patient have difficulty concentrating, remembering, or making decisions?: No Does the patient have difficulty walking or climbing stairs?: No Does the patient have difficulty dressing or bathing?: No Does the patient have difficulty doing errands alone such as visiting a doctor's office or shopping?: No  Depression Screen done today and results listed below:     03/21/2023    1:02 PM 02/22/2023  1:07 PM 03/17/2022    4:15 PM 03/16/2021    4:31 PM 02/19/2020    3:21 PM  Depression screen PHQ 2/9  Decreased Interest 0 0 0 0 0  Down, Depressed, Hopeless 0 0 0 0 0  PHQ - 2 Score 0 0 0 0 0  Altered sleeping 0 0 0 0   Tired, decreased energy 0 0 0 0   Change in appetite 0 0 0 0   Feeling bad or failure about yourself  0 0 0 0   Trouble concentrating 0 0 0 0   Moving slowly or fidgety/restless 0 0 0 0   Suicidal thoughts 0 0 0 0   PHQ-9 Score 0 0 0 0   Difficult doing work/chores Not  difficult at all Not difficult at all Not difficult at all Not difficult at all       03/21/2023    1:02 PM 02/22/2023    1:08 PM 03/17/2022    4:15 PM 03/16/2021    4:32 PM  GAD 7 : Generalized Anxiety Score  Nervous, Anxious, on Edge 0 0 0 0  Control/stop worrying 0 0 0 0  Worry too much - different things 0 0 0 0  Trouble relaxing 0 0 0 0  Restless 0 0 0 0  Easily annoyed or irritable 0 0 0 0  Afraid - awful might happen 0 0 0 0  Total GAD 7 Score 0 0 0 0  Anxiety Difficulty Not difficult at all Not difficult at all  Not difficult at all      09/26/2020    8:50 AM 03/16/2021    4:31 PM 03/17/2022    4:16 PM 02/22/2023    1:07 PM 03/21/2023    1:02 PM  Fall Risk  Falls in the past year?  0 0 0 0  Was there an injury with Fall?  0 0 0 0  Fall Risk Category Calculator  0 0 0 0  Fall Risk Category (Retired)  Low Low    (RETIRED) Patient Fall Risk Level Low fall risk Low fall risk Low fall risk    Patient at Risk for Falls Due to  No Fall Risks No Fall Risks No Fall Risks No Fall Risks  Fall risk Follow up  Falls evaluation completed Falls prevention discussed Falls evaluation completed      Past Medical History:  Past Medical History:  Diagnosis Date   Anemia    In the past   Goiter    History of kidney stones    Migraines    Nephrolithiasis 1988   Thyroid disease     Surgical History:  Past Surgical History:  Procedure Laterality Date   ABDOMINAL HYSTERECTOMY     CHOLECYSTECTOMY     COLONOSCOPY WITH PROPOFOL N/A 07/20/2017   Procedure: COLONOSCOPY WITH PROPOFOL;  Surgeon: Toledo, Boykin Nearing, MD;  Location: [facility];  Service: Gastroenterology;  Laterality: N/A;   COLONOSCOPY WITH PROPOFOL N/A 09/26/2020   Procedure: COLONOSCOPY WITH PROPOFOL;  Surgeon: Midge Minium, MD;  Location: [facility];  Service: Endoscopy;  Laterality: N/A;   CYSTOSCOPY N/A 04/21/2016   Procedure: CYSTOSCOPY;  Surgeon: Artelia Laroche, MD;  Location: [facility];   Service: Gynecology;  Laterality: N/A;   ROBOTIC ASSISTED TOTAL HYSTERECTOMY WITH BILATERAL SALPINGO OOPHERECTOMY Bilateral 04/21/2016   Procedure: ROBOTIC ASSISTED TOTAL HYSTERECTOMY WITH BILATERAL SALPINGO OOPHORECTOMY;  Surgeon: Artelia Laroche, MD;  Location: [facility];  Service: Gynecology;  Laterality: Bilateral;   SENTINEL NODE  BIOPSY N/A 04/21/2016   Procedure: SENTINEL NODE BIOPSY;  Surgeon: Artelia Laroche, MD;  Location: [facility];  Service: Gynecology;  Laterality: N/A;    Medications:  Current Outpatient Medications on File Prior to Visit  Medication Sig   Ascorbic Acid (VITAMIN C) 1000 MG tablet Take 1,000 mg by mouth daily.   Cholecalciferol (VITAMIN D) 125 MCG (5000 UT) CAPS Take 1,000 Units by mouth 2 (two) times a week.    levothyroxine (SYNTHROID) 125 MCG tablet Take 125 mcg by mouth daily before breakfast.   rosuvastatin (CRESTOR) 20 MG tablet Take 1 tablet (20 mg total) by mouth daily. (Patient taking differently: Take 20 mg by mouth every other day.)   No current facility-administered medications on file prior to visit.    Allergies:  Allergies  Allergen Reactions   Sulfa Antibiotics Rash    Social History:  Social History   Socioeconomic History   Marital status: Widowed    Spouse name: Not on file   Number of children: Not on file   Years of education: Not on file   Highest education level: Associate degree: academic program  Occupational History   Not on file  Tobacco Use   Smoking status: Never   Smokeless tobacco: Never  Vaping Use   Vaping status: Never Used  Substance and Sexual Activity   Alcohol use: Not Currently   Drug use: No   Sexual activity: Yes  Other Topics Concern   Not on file  Social History Narrative   Not on file   Social Drivers of Health   Financial Resource Strain: Medium Risk (02/21/2023)   Overall Financial Resource Strain (CARDIA)    Difficulty of Paying Living Expenses: Somewhat hard  Food Insecurity:  No Food Insecurity (02/21/2023)   Hunger Vital Sign    Worried About Running Out of Food in the Last Year: Never true    Ran Out of Food in the Last Year: Never true  Transportation Needs: No Transportation Needs (02/21/2023)   PRAPARE - Administrator, Civil Service (Medical): No    Lack of Transportation (Non-Medical): No  Physical Activity: Unknown (02/21/2023)   Exercise Vital Sign    Days of Exercise per Week: Patient declined    Minutes of Exercise per Session: Not on file  Stress: No Stress Concern Present (02/21/2023)   [employer] of Occupational Health - Occupational Stress Questionnaire    Feeling of Stress : Not at all  Social Connections: Moderately Integrated (02/21/2023)   Social Connection and Isolation Panel [NHANES]    Frequency of Communication with Friends and Family: Three times a week    Frequency of Social Gatherings with Friends and Family: Three times a week    Attends Religious Services: 1 to 4 times per year    Active Member of Clubs or Organizations: Yes    Attends Banker Meetings: More than 4 times per year    Marital Status: Widowed  Intimate Partner Violence: Not At Risk (03/16/2021)   Humiliation, Afraid, Rape, and Kick questionnaire    Fear of Current or Ex-Partner: No    Emotionally Abused: No    Physically Abused: No    Sexually Abused: No   Social History   Tobacco Use  Smoking Status Never  Smokeless Tobacco Never   Social History   Substance and Sexual Activity  Alcohol Use Not Currently    Family History:  Family History  Problem Relation Age of Onset   Cancer Mother  colon and breast   Diabetes Mother    Breast cancer Mother 62   Alzheimer's disease Father    Heart disease Father    Hypertension Father    Diabetes Father    Diabetes Sister    Hypertension Sister    Diabetes Brother    Liver disease Brother    Heart disease Maternal Grandfather        MI   Stroke Paternal Grandmother     Thyroid disease Sister    Diabetes Brother    Breast cancer Maternal Aunt        late 52's early 70's   Breast cancer Cousin 40    Past medical history, surgical history, medications, allergies, family history and social history reviewed with patient today and changes made to appropriate areas of the chart.   Review of Systems - negative All other ROS negative except what is listed above and in the HPI.      Objective:    BP 103/71 (BP Location: Right Wrist, Patient Position: Sitting, Cuff Size: Large)   Pulse 86   Temp 98.6 F (37 C) (Oral)   Resp 14   Ht 5' 8.35" (1.736 m)   Wt 278 lb (126.1 kg)   LMP 09/17/2013 (Approximate)   SpO2 99%   BMI 41.84 kg/m   Wt Readings from Last 3 Encounters:  03/21/23 278 lb (126.1 kg)  02/22/23 283 lb 9.6 oz (128.6 kg)  03/17/22 (!) 312 lb 8 oz (141.7 kg)    Physical Exam Vitals and nursing note reviewed.  Constitutional:      General: She is awake. She is not in acute distress.    Appearance: Normal appearance. She is well-developed and well-groomed. She is obese. She is not ill-appearing or toxic-appearing.  HENT:     Head: Normocephalic and atraumatic.     Right Ear: Hearing, tympanic membrane, ear canal and external ear normal. No drainage. No middle ear effusion. There is no impacted cerumen.     Left Ear: Hearing, tympanic membrane, ear canal and external ear normal. No drainage.  No middle ear effusion. There is no impacted cerumen.     Nose: Nose normal.     Right Sinus: No maxillary sinus tenderness or frontal sinus tenderness.     Left Sinus: No maxillary sinus tenderness or frontal sinus tenderness.     Mouth/Throat:     Mouth: Mucous membranes are moist.     Pharynx: Oropharynx is clear. Uvula midline. No pharyngeal swelling, oropharyngeal exudate or posterior oropharyngeal erythema.  Eyes:     General: Lids are normal.        Right eye: No discharge.        Left eye: No discharge.     Extraocular Movements:  Extraocular movements intact.     Conjunctiva/sclera: Conjunctivae normal.     Pupils: Pupils are equal, round, and reactive to light.     Visual Fields: Right eye visual fields normal and left eye visual fields normal.  Neck:     Thyroid: No thyromegaly.     Vascular: No carotid bruit.     Trachea: Trachea normal.  Cardiovascular:     Rate and Rhythm: Normal rate and regular rhythm.     Heart sounds: Normal heart sounds. No murmur heard.    No gallop.  Pulmonary:     Effort: Pulmonary effort is normal. No accessory muscle usage or respiratory distress.     Breath sounds: Normal breath sounds. No decreased breath sounds, wheezing  or rhonchi.  Chest:     Comments: Deferred per patient request.  Recent mammogram. Abdominal:     General: Bowel sounds are normal. There is no distension.     Palpations: Abdomen is soft. There is no hepatomegaly or splenomegaly.     Tenderness: There is no abdominal tenderness.  Musculoskeletal:        General: Normal range of motion.     Cervical back: Normal range of motion and neck supple.     Right lower leg: No edema.     Left lower leg: No edema.  Lymphadenopathy:     Head:     Right side of head: No submental, submandibular, tonsillar, preauricular or posterior auricular adenopathy.     Left side of head: No submental, submandibular, tonsillar, preauricular or posterior auricular adenopathy.     Cervical: No cervical adenopathy.  Skin:    General: Skin is warm and dry.     Capillary Refill: Capillary refill takes less than 2 seconds.     Findings: No rash.  Neurological:     Mental Status: She is alert and oriented to person, place, and time.     Cranial Nerves: Cranial nerves 2-12 are intact.     Sensory: Sensation is intact.     Motor: No weakness or seizure activity.     Coordination: Romberg sign positive. Finger-Nose-Finger Test and Heel to [place] Test normal.     Gait: Gait is intact.     Deep Tendon Reflexes: Reflexes are normal and  symmetric.     Reflex Scores:      Brachioradialis reflexes are 2+ on the right side and 2+ on the left side.      Patellar reflexes are 2+ on the right side and 2+ on the left side.    Comments: With Romberg was near fall, very unsteady.  Psychiatric:        Attention and Perception: Attention normal.        Mood and Affect: Mood normal.        Speech: Speech normal.        Behavior: Behavior normal. Behavior is cooperative.        Thought Content: Thought content normal.        Judgment: Judgment normal.    Results for orders placed or performed in visit on 02/22/23  CBC with Differential/Platelet   Collection Time: 02/22/23  1:35 PM  Result Value Ref Range   WBC 6.2 3.4 - 10.8 x10E3/uL   RBC 4.64 3.77 - 5.28 x10E6/uL   Hemoglobin 13.3 11.1 - 15.9 g/dL   Hematocrit 29.5 62.1 - 46.6 %   MCV 88 79 - 97 fL   MCH 28.7 26.6 - 33.0 pg   MCHC 32.7 31.5 - 35.7 g/dL   RDW 30.8 65.7 - 84.6 %   Platelets 291 150 - 450 x10E3/uL   Neutrophils 52 Not Estab. %   Lymphs 39 Not Estab. %   Monocytes 6 Not Estab. %   Eos 2 Not Estab. %   Basos 1 Not Estab. %   Neutrophils Absolute 3.2 1.4 - 7.0 x10E3/uL   Lymphocytes Absolute 2.4 0.7 - 3.1 x10E3/uL   Monocytes Absolute 0.4 0.1 - 0.9 x10E3/uL   EOS (ABSOLUTE) 0.1 0.0 - 0.4 x10E3/uL   Basophils Absolute 0.0 0.0 - 0.2 x10E3/uL   Immature Granulocytes 0 Not Estab. %   Immature Grans (Abs) 0.0 0.0 - 0.1 x10E3/uL  Comprehensive metabolic panel   Collection Time: 02/22/23  1:35  PM  Result Value Ref Range   Glucose 80 70 - 99 mg/dL   BUN 10 8 - 27 mg/dL   Creatinine, Ser 0.98 0.57 - 1.00 mg/dL   eGFR 68 >11 BJ/YNW/2.95   BUN/Creatinine Ratio 11 (L) 12 - 28   Sodium 140 134 - 144 mmol/L   Potassium 4.1 3.5 - 5.2 mmol/L   Chloride 104 96 - 106 mmol/L   CO2 24 20 - 29 mmol/L   Calcium 9.1 8.7 - 10.3 mg/dL   Total Protein 6.2 6.0 - 8.5 g/dL   Albumin 3.9 3.9 - 4.9 g/dL   Globulin, Total 2.3 1.5 - 4.5 g/dL   Bilirubin Total 0.3 0.0 - 1.2  mg/dL   Alkaline Phosphatase 76 44 - 121 IU/L   AST 22 0 - 40 IU/L   ALT 18 0 - 32 IU/L  Fecal occult blood, imunochemical   Collection Time: 02/23/23  8:45 AM   Specimen: Stool   ST  Result Value Ref Range   Fecal Occult Bld Negative Negative      Assessment & Plan:   Problem List Items Addressed This Visit       Endocrine   Hypothyroidism   Chronic, ongoing.  Followed by endocrinology.  Recommend continuing current dose and reviewed endo's recent note.  Thyroid labs today due to recent adjustment.       Relevant Orders   T4, free   TSH     Other   Elevated hemoglobin A1c measurement   Noted past labs, recheck today and initiate medication as needed based on findings.       Relevant Orders   CBC with Differential/Platelet   Comprehensive metabolic panel   HgB A1c   Fatigue   Acute and ongoing for over 2 weeks, overall reassuring neuro exam with exception of + Romberg noted (quite off balance).  Is noting some SOB with this.  Will obtain CXR to further assess.  Labs today.  Refer to vertigo plan of care for further.      Relevant Orders   Ferritin   Iron   C-reactive protein   Sed Rate (ESR)   ANA 12 Plus Profile (RDL)   B Nat Peptide   DG Chest 2 View   Hyperlipemia, mixed   Chronic, ongoing.  Did not tolerate Atorvastatin.  Continue Crestor at low dose every other day, could try increase if needed but concern for myalgia with this.  Lipid panel and CMP today.      Relevant Orders   Comprehensive metabolic panel   Lipid Panel w/o Chol/HDL Ratio   Obesity - Primary   BMI 41.84 with 47 pounds loss over past two years, praised for this.  Recommended eating smaller high protein, low fat meals more frequently and exercising 30 mins a day 5 times a week with a goal of 10-15lb weight loss in the next 3 months. Patient voiced their understanding and motivation to adhere to these recommendations.       Relevant Orders   CBC with Differential/Platelet   Vertigo    Acute and ongoing for over 2 weeks, overall reassuring neuro exam with exception of + Romberg noted (quite off balance).  Would benefit imaging due to this and her other presenting symptoms.  Have ordered CT head and discussed with patient.  Labs today.  Plan on return in 2 weeks, sooner if worsening symptoms or abnormal findings on testing.      Relevant Orders   CT HEAD W & WO  CONTRAST ( )   Vitamin D deficiency   Chronic.  Recheck Vit D level today and continue daily supplement.  DEXA at age 9.      Relevant Orders   VITAMIN D 25 Hydroxy (Vit-D Deficiency, Fractures)   Other Visit Diagnoses       Need for Tdap vaccination       Tdap in office today, educated patient.   Relevant Orders   Tdap vaccine greater than or equal to 7yo IM     Encounter for annual physical exam       Annual physical today with labs and health maintenance reviewed, discussed with patient.        Follow up plan: Return for FATIGUE AND DIZZINESS + SOB.   LABORATORY TESTING:  - Pap smear: not applicable  IMMUNIZATIONS:   - Tdap: Tetanus vaccination status reviewed: last tetanus booster within 10 years. - Influenza: Up to date - Pneumovax: Not applicable - Prevnar: Not applicable - HPV: Not applicable - Zostavax vaccine:  x 2 years ago, will obtain records from [facility] near [place] - Covid: Up To Date  SCREENING: -Mammogram: Up To Date -- 11/29/22 -- she prefers every 2 year screening - Colonoscopy: Up to date  - Bone Density: Not applicable  -Hearing Test: Not applicable  -Spirometry: Not applicable   PATIENT COUNSELING:   Advised to take 1 mg of folate supplement per day if capable of pregnancy.   Sexuality: Discussed sexually transmitted diseases, partner selection, use of condoms, avoidance of unintended pregnancy  and contraceptive alternatives.   Advised to avoid cigarette smoking.  I discussed with the patient that most people either abstain from alcohol or  drink within safe limits (<=14/week and <=4 drinks/occasion for males, <=7/weeks and <= 3 drinks/occasion for females) and that the risk for alcohol disorders and other health effects rises proportionally with the number of drinks per week and how often a drinker exceeds daily limits.  Discussed cessation/primary prevention of drug use and availability of treatment for abuse.   Diet: Encouraged to adjust caloric intake to maintain  or achieve ideal body weight, to reduce intake of dietary saturated fat and total fat, to limit sodium intake by avoiding high sodium foods and not adding table salt, and to maintain adequate dietary potassium and calcium preferably from fresh fruits, vegetables, and low-fat dairy products.    Stressed the importance of regular exercise  Injury prevention: Discussed safety belts, safety helmets, smoke detector, smoking near bedding or upholstery.   Dental health: Discussed importance of regular tooth brushing, flossing, and dental visits.    NEXT PREVENTATIVE PHYSICAL DUE IN 1 YEAR. Return for FATIGUE AND DIZZINESS + SOB.

## 2023-03-21 NOTE — Assessment & Plan Note (Signed)
Acute and ongoing for over 2 weeks, overall reassuring neuro exam with exception of + Romberg noted (quite off balance).  Would benefit imaging due to this and her other presenting symptoms.  Have ordered CT head and discussed with patient.  Labs today.  Plan on return in 2 weeks, sooner if worsening symptoms or abnormal findings on testing.

## 2023-03-21 NOTE — Assessment & Plan Note (Signed)
Acute and ongoing for over 2 weeks, overall reassuring neuro exam with exception of + Romberg noted (quite off balance).  Is noting some SOB with this.  Will obtain CXR to further assess.  Labs today.  Refer to vertigo plan of care for further.

## 2023-03-21 NOTE — Assessment & Plan Note (Signed)
Chronic, ongoing.  Followed by endocrinology.  Recommend continuing current dose and reviewed endo's recent note.  Thyroid labs today due to recent adjustment.

## 2023-03-21 NOTE — Assessment & Plan Note (Signed)
Chronic.  Recheck Vit D level today and continue daily supplement.  DEXA at age 62.

## 2023-03-21 NOTE — Assessment & Plan Note (Signed)
BMI 41.84 with 47 pounds loss over past two years, praised for this.  Recommended eating smaller high protein, low fat meals more frequently and exercising 30 mins a day 5 times a week with a goal of 10-15lb weight loss in the next 3 months. Patient voiced their understanding and motivation to adhere to these recommendations.

## 2023-03-21 NOTE — Telephone Encounter (Signed)
Stat Chest X-Ray Results: Rad recommending a CT for follow up since there is a developing mass/ lesion in the anterior right lung.

## 2023-03-21 NOTE — Telephone Encounter (Signed)
Spoke to [place] via telephone to review recent imaging.  Her head CT returned with no abnormal findings, but her chest imaging returned noting density in right anterior aspect, cannot rule out mass.  They recommend CT lung to be performed.  Discussed at length with her these findings and concerns as she has been very fatigued and has SOB present.  Will place order for urgent lung CT and further assess this to determine next steps needed.  She is agreeable to this plan and reports understanding of the results.

## 2023-03-21 NOTE — Assessment & Plan Note (Signed)
Chronic, ongoing.  Did not tolerate Atorvastatin.  Continue Crestor at low dose every other day, could try increase if needed but concern for myalgia with this.  Lipid panel and CMP today.

## 2023-03-22 ENCOUNTER — Telehealth: Payer: Self-pay | Admitting: Nurse Practitioner

## 2023-03-22 ENCOUNTER — Ambulatory Visit
Admission: RE | Admit: 2023-03-22 | Discharge: 2023-03-22 | Disposition: A | Discharge status: Home / Self Care | Payer: 59 | Source: Ambulatory Visit | Attending: Nurse Practitioner | Admitting: Nurse Practitioner

## 2023-03-22 ENCOUNTER — Encounter: Payer: Self-pay | Admitting: Nurse Practitioner

## 2023-03-22 DIAGNOSIS — R918 Other nonspecific abnormal finding of lung field: Secondary | ICD-10-CM | POA: Diagnosis present

## 2023-03-22 NOTE — Telephone Encounter (Signed)
Ann Robinson with [facility] is calling in to give results to provider or nurse. Please follow up with Ann Robinson. [phone]

## 2023-03-22 NOTE — Progress Notes (Signed)
Refer to telephone encounter.

## 2023-03-22 NOTE — Progress Notes (Signed)
Contacted via MyChart The 10-year ASCVD risk score (Arnett DK, et al., 2019) is: 4.1%   Values used to calculate the score:     Age: 62 years     Sex: Female     Is Non-Hispanic African American: No     Diabetic: No     Tobacco smoker: No     Systolic Blood Pressure: 103 mmHg     Is BP treated: No     HDL Cholesterol: 27 mg/dL     Total Cholesterol: 185 mg/dL

## 2023-03-22 NOTE — Telephone Encounter (Signed)
Spoke to patient on telephone to alert her to CT lung results.  There is a mass noted in right lung apex that is concerning for neoplasm, as well as some lymphadenopathy.  Recent mammogram in August was reassuring.  Reviewed all results with her and discussed plan.  Will obtain PET scan and urgently get into pulmonary to determine next steps.

## 2023-03-22 NOTE — Telephone Encounter (Signed)
Spoke with [facility] to follow up and they wanted to make sure the provider received the CT report on the patient. Verbalized understanding and no further questions at this time.

## 2023-03-25 ENCOUNTER — Ambulatory Visit
Admission: RE | Admit: 2023-03-25 | Discharge: 2023-03-25 | Disposition: A | Discharge status: Home / Self Care | Payer: 59 | Source: Ambulatory Visit | Attending: Nurse Practitioner | Admitting: Nurse Practitioner

## 2023-03-25 DIAGNOSIS — I700 Atherosclerosis of aorta: Secondary | ICD-10-CM | POA: Diagnosis not present

## 2023-03-25 DIAGNOSIS — R918 Other nonspecific abnormal finding of lung field: Secondary | ICD-10-CM | POA: Diagnosis present

## 2023-03-25 DIAGNOSIS — R590 Localized enlarged lymph nodes: Secondary | ICD-10-CM | POA: Diagnosis not present

## 2023-03-25 DIAGNOSIS — C773 Secondary and unspecified malignant neoplasm of axilla and upper limb lymph nodes: Secondary | ICD-10-CM | POA: Insufficient documentation

## 2023-03-25 DIAGNOSIS — C3411 Malignant neoplasm of upper lobe, right bronchus or lung: Secondary | ICD-10-CM | POA: Diagnosis not present

## 2023-03-25 DIAGNOSIS — C771 Secondary and unspecified malignant neoplasm of intrathoracic lymph nodes: Secondary | ICD-10-CM | POA: Diagnosis not present

## 2023-03-25 DIAGNOSIS — C7800 Secondary malignant neoplasm of unspecified lung: Secondary | ICD-10-CM | POA: Insufficient documentation

## 2023-03-25 LAB — GLUCOSE, CAPILLARY: Glucose-Capillary: 78 mg/dL (ref 70–99)

## 2023-03-25 MED ORDER — FLUDEOXYGLUCOSE F - 18 (FDG) INJECTION
13.0700 | Freq: Once | INTRAVENOUS | Status: AC | PRN
  Administered 2023-03-25: 13.07 via INTRAVENOUS

## 2023-03-26 ENCOUNTER — Other Ambulatory Visit: Payer: Self-pay | Admitting: Nurse Practitioner

## 2023-03-26 ENCOUNTER — Encounter: Payer: Self-pay | Admitting: Nurse Practitioner

## 2023-03-26 DIAGNOSIS — R918 Other nonspecific abnormal finding of lung field: Secondary | ICD-10-CM | POA: Insufficient documentation

## 2023-03-26 DIAGNOSIS — E079 Disorder of thyroid, unspecified: Secondary | ICD-10-CM | POA: Insufficient documentation

## 2023-03-26 NOTE — Progress Notes (Signed)
Contacted via MyChart   Good morning Ann Robinson, the PET scan verifies some of the previous imaging noting a lung mass which appears to be consistent with primary lung cancer, which we have discussed.  As we discussed from previous scan there is also some lymph node involvement.  They also noted an area to thyroid and recommend an ultrasound which I will order.  I know you are scheduled to see pulmonary.  I will also place an oncology referral to allow you to meet with them as well.  Any questions? Sending many hugs your way.  We will take a step at a time through this and I am glad you mentioned your symptoms so we could get on top of this.:) Any questions? Keep being stellar!!  Thank you for allowing me to participate in your care.  I appreciate you. Kindest regards, Alonnah Lampkins

## 2023-04-01 ENCOUNTER — Ambulatory Visit
Admission: RE | Admit: 2023-04-01 | Discharge: 2023-04-01 | Disposition: A | Discharge status: Home / Self Care | Payer: 59 | Source: Ambulatory Visit | Attending: Nurse Practitioner | Admitting: Nurse Practitioner

## 2023-04-01 DIAGNOSIS — E079 Disorder of thyroid, unspecified: Secondary | ICD-10-CM | POA: Diagnosis present

## 2023-04-03 LAB — CBC WITH DIFFERENTIAL/PLATELET
Basophils Absolute: 0.1 10*3/uL (ref 0.0–0.2)
Basos: 1 %
EOS (ABSOLUTE): 0.4 10*3/uL (ref 0.0–0.4)
Eos: 8 %
Hematocrit: 42.7 % (ref 34.0–46.6)
Hemoglobin: 14.1 g/dL (ref 11.1–15.9)
Immature Grans (Abs): 0 10*3/uL (ref 0.0–0.1)
Immature Granulocytes: 0 %
Lymphocytes Absolute: 1 10*3/uL (ref 0.7–3.1)
Lymphs: 21 %
MCH: 28.4 pg (ref 26.6–33.0)
MCHC: 33 g/dL (ref 31.5–35.7)
MCV: 86 fL (ref 79–97)
Monocytes Absolute: 0.5 10*3/uL (ref 0.1–0.9)
Monocytes: 10 %
Neutrophils Absolute: 3 10*3/uL (ref 1.4–7.0)
Neutrophils: 60 %
Platelets: 252 10*3/uL (ref 150–450)
RBC: 4.96 x10E6/uL (ref 3.77–5.28)
RDW: 12.7 % (ref 11.7–15.4)
WBC: 5.1 10*3/uL (ref 3.4–10.8)

## 2023-04-03 LAB — ANA 12 PLUS PROFILE, POSITIVE
Anti-CCP Ab, IgG & IgA (RDL): <20 U (ref <20)
Anti-Cardiolipin Ab, IgA (RDL): <12 [APL'U]/mL (ref <12)
Anti-Cardiolipin Ab, IgG (RDL): <15 [GPL'U]/mL (ref <15)
Anti-Cardiolipin Ab, IgM (RDL): 18 [MPL'U]/mL (ref <13)
Anti-Centromere Ab (RDL): <1:40 {titer}
Anti-Chromatin Ab, IgG (RDL): <20 U (ref <20)
Anti-La (SS-B) Ab (RDL): <20 U (ref <20)
Anti-Ro (SS-A) Ab (RDL): <20 U (ref <20)
Anti-Scl-70 Ab (RDL): <20 U (ref <20)
Anti-Sm Ab (RDL): <20 U (ref <20)
Anti-TPO Ab (RDL): <9 [IU]/mL (ref <9.0)
Anti-U1 RNP Ab (RDL): <20 U (ref <20)
Anti-dsDNA Ab by Farr(RDL): <8 [IU]/mL (ref <8.0)
C3 Complement (RDL): 158 mg/dL (ref 82–167)
C4 Complement (RDL): 24 mg/dL (ref 14–44)
Nucleolar Pattern: 1:160 {titer} — ABNORMAL HIGH
Rheumatoid Factor by Turb RDL: <14 [IU]/mL (ref <14)

## 2023-04-03 LAB — COMPREHENSIVE METABOLIC PANEL
ALT: 13 [IU]/L (ref 0–32)
AST: 16 [IU]/L (ref 0–40)
Albumin: 3.3 g/dL — ABNORMAL LOW (ref 3.9–4.9)
Alkaline Phosphatase: 82 [IU]/L (ref 44–121)
BUN/Creatinine Ratio: 25 (ref 12–28)
BUN: 21 mg/dL (ref 8–27)
Bilirubin Total: 0.3 mg/dL (ref 0.0–1.2)
CO2: 20 mmol/L (ref 20–29)
Calcium: 8.5 mg/dL — ABNORMAL LOW (ref 8.7–10.3)
Chloride: 99 mmol/L (ref 96–106)
Creatinine, Ser: 0.84 mg/dL (ref 0.57–1.00)
Globulin, Total: 2 g/dL (ref 1.5–4.5)
Glucose: 110 mg/dL — ABNORMAL HIGH (ref 70–99)
Potassium: 4.2 mmol/L (ref 3.5–5.2)
Sodium: 133 mmol/L — ABNORMAL LOW (ref 134–144)
Total Protein: 5.3 g/dL — ABNORMAL LOW (ref 6.0–8.5)
eGFR: 78 mL/min/{1.73_m2} (ref >59)

## 2023-04-03 LAB — LIPID PANEL W/O CHOL/HDL RATIO
Cholesterol, Total: 185 mg/dL (ref 100–199)
HDL: 27 mg/dL — ABNORMAL LOW (ref >39)
LDL Chol Calc (NIH): 113 mg/dL — ABNORMAL HIGH (ref 0–99)
Triglycerides: 257 mg/dL — ABNORMAL HIGH (ref 0–149)
VLDL Cholesterol Cal: 45 mg/dL — ABNORMAL HIGH (ref 5–40)

## 2023-04-03 LAB — SEDIMENTATION RATE: Sed Rate: 43 mm/h — ABNORMAL HIGH (ref 0–40)

## 2023-04-03 LAB — IRON: Iron: 39 ug/dL (ref 27–139)

## 2023-04-03 LAB — HEMOGLOBIN A1C
Est. average glucose Bld gHb Est-mCnc: 114 mg/dL
Hgb A1c MFr Bld: 5.6 % (ref 4.8–5.6)

## 2023-04-03 LAB — ANA 12 PLUS PROFILE (RDL): Anti-Nuclear Ab by IFA (RDL): POSITIVE — AB

## 2023-04-03 LAB — C-REACTIVE PROTEIN: CRP: 27 mg/L — ABNORMAL HIGH (ref 0–10)

## 2023-04-03 LAB — TSH: TSH: 0.778 u[IU]/mL (ref 0.450–4.500)

## 2023-04-03 LAB — T4, FREE: Free T4: 1.52 ng/dL (ref 0.82–1.77)

## 2023-04-03 LAB — FERRITIN: Ferritin: 273 ng/mL — ABNORMAL HIGH (ref 15–150)

## 2023-04-03 LAB — VITAMIN D 25 HYDROXY (VIT D DEFICIENCY, FRACTURES): Vit D, 25-Hydroxy: 32.4 ng/mL (ref 30.0–100.0)

## 2023-04-03 LAB — BRAIN NATRIURETIC PEPTIDE: BNP: 9.2 pg/mL (ref 0.0–100.0)

## 2023-04-04 ENCOUNTER — Ambulatory Visit (INDEPENDENT_AMBULATORY_CARE_PROVIDER_SITE_OTHER): Payer: 59 | Admitting: Nurse Practitioner

## 2023-04-04 ENCOUNTER — Other Ambulatory Visit: Payer: Self-pay | Admitting: Nurse Practitioner

## 2023-04-04 ENCOUNTER — Encounter: Payer: Self-pay | Admitting: Nurse Practitioner

## 2023-04-04 VITALS — BP 127/71 | HR 71 | Temp 97.9°F | Wt 282.8 lb

## 2023-04-04 DIAGNOSIS — R530 Neoplastic (malignant) related fatigue: Secondary | ICD-10-CM

## 2023-04-04 DIAGNOSIS — E079 Disorder of thyroid, unspecified: Secondary | ICD-10-CM

## 2023-04-04 DIAGNOSIS — R918 Other nonspecific abnormal finding of lung field: Secondary | ICD-10-CM

## 2023-04-04 NOTE — Progress Notes (Signed)
ENT referral for biopsy

## 2023-04-04 NOTE — Patient Instructions (Signed)
Shortness of Breath, Adult Shortness of breath means you have trouble breathing. Shortness of breath could be a sign of a medical problem. Follow these instructions at home:  Pollution Do not smoke or use any products that contain nicotine or tobacco. If you need help quitting, ask your doctor. Avoid things that can make it harder to breathe, such as: Smoke of all kinds. This includes smoke from campfires or forest fires. Do not smoke or allow others to smoke in your home. Mold. Dust. Air pollution. Chemical smells. Things that can give you an allergic reaction (allergens) if you have allergies. Keep your living space clean. Use products that help remove mold and dust. General instructions Watch for any changes in your symptoms. Take over-the-counter and prescription medicines only as told by your doctor. This includes oxygen therapy and inhaled medicines. Rest as needed. Return to your normal activities when your doctor says that it is safe. Keep all follow-up visits. Contact a doctor if: Your condition does not get better as soon as expected. You have a hard time doing your normal activities, even after you rest. You have new symptoms. You cannot walk up stairs. You cannot exercise the way you normally do. Get help right away if: Your shortness of breath gets worse. You have trouble breathing when you are resting. You feel light-headed or you faint. You have a cough that is not helped by medicines. You cough up blood. You have pain with breathing. You have pain in your chest, arms, shoulders, or belly (abdomen). You have a fever. These symptoms may be an emergency. Get help right away. Call 911. Do not wait to see if the symptoms will go away. Do not drive yourself to the hospital. Summary Shortness of breath is when you have trouble breathing enough air. It can be a sign of a medical problem. Avoid things that make it hard for you to breathe, such as smoking, pollution,  mold, and dust. Watch for any changes in your symptoms. Contact your doctor if you do not get better or you get worse. This information is not intended to replace advice given to you by your health care provider. Make sure you discuss any questions you have with your health care provider. Document Revised: 11/08/2020 Document Reviewed: 11/08/2020 Elsevier Patient Education  2024 [employer].

## 2023-04-04 NOTE — Assessment & Plan Note (Signed)
Noted on PET 03/25/23 with recommendation for biopsy.  ENT referral placed.

## 2023-04-04 NOTE — Progress Notes (Signed)
BP 127/71   Pulse 71   Temp 97.9 F (36.6 C) (Oral)   Wt 282 lb 12.8 oz (128.3 kg)   LMP 09/17/2013 (Approximate)   SpO2 98%   BMI 42.57 kg/m    Subjective:    Patient ID: Ann Robinson, female    DOB: Aug 23, 1960, 62 y.o.   MRN: [id number]  HPI: Ann Robinson is a 62 y.o. female  Chief Complaint  Patient presents with   Fatigue    Patient states she has been feeling better since she has started to eat again   Dizziness   FATIGUE Follow-up today for fatigue and dizziness reported at visit on 03/21/23.  Had full work-up at time and was noted to have a lung mass right apex + lymphadenopathy -- both on CT and PET scan.  These are reported to be concerning for neoplasm.  She is scheduled to see pulmonary upcoming.    Her fatigue is improving, but legs feel like rubber. Is eating better and has gained 5 lbs.  Has moved in with her sister Kendell Bane to assist.  Occasionally has dizziness still, if on feet too long.  SOB continues to be present if stands to long.  On PET she was also noted to have thyroid nodule with recommendation for ultrasound which did noted nodule and recommendation for FNA. Duration:  weeks Severity: moderate  Onset: sudden Context when symptoms started:  unknown Symptoms improve with rest: no  Depressive symptoms: no Stress/anxiety: somewhat Insomnia: none Snoring: no Observed apnea by bed partner: no Daytime hypersomnolence:no Wakes feeling refreshed: yes History of sleep study: no Dysnea on exertion:  yes Orthopnea/PND: no Chest pain: no Chronic cough: no Lower extremity edema: no Arthralgias:no Myalgias: no Weakness: yes Rash: no   Relevant past medical, surgical, family and social history reviewed and updated as indicated. Interim medical history since our last visit reviewed. Allergies and medications reviewed and updated.  Review of Systems  Constitutional:  Positive for fatigue. Negative for activity change, appetite change,  diaphoresis and fever.  Respiratory:  Positive for shortness of breath. Negative for cough, chest tightness and wheezing.   Cardiovascular:  Negative for chest pain, palpitations and leg swelling.  Gastrointestinal: Negative.   Neurological:  Positive for dizziness. Negative for syncope, weakness, light-headedness, numbness and headaches.  Psychiatric/Behavioral: Negative.      Per HPI unless specifically indicated above     Objective:    BP 127/71   Pulse 71   Temp 97.9 F (36.6 C) (Oral)   Wt 282 lb 12.8 oz (128.3 kg)   LMP 09/17/2013 (Approximate)   SpO2 98%   BMI 42.57 kg/m   Wt Readings from Last 3 Encounters:  04/04/23 282 lb 12.8 oz (128.3 kg)  03/21/23 278 lb (126.1 kg)  02/22/23 283 lb 9.6 oz (128.6 kg)    Physical Exam Vitals and nursing note reviewed.  Constitutional:      General: She is awake. She is not in acute distress.    Appearance: She is well-developed and well-groomed. She is obese. She is not ill-appearing or toxic-appearing.  HENT:     Head: Normocephalic.     Right Ear: Hearing and external ear normal.     Left Ear: Hearing and external ear normal.  Eyes:     General: Lids are normal.        Right eye: No discharge.        Left eye: No discharge.     Conjunctiva/sclera: Conjunctivae normal.  Pupils: Pupils are equal, round, and reactive to light.  Neck:     Thyroid: No thyromegaly.     Vascular: No carotid bruit.  Cardiovascular:     Rate and Rhythm: Normal rate and regular rhythm.     Heart sounds: Normal heart sounds. No murmur heard.    No gallop.  Pulmonary:     Effort: Pulmonary effort is normal. No accessory muscle usage or respiratory distress.     Breath sounds: Normal breath sounds.  Abdominal:     General: Bowel sounds are normal. There is no distension.     Palpations: Abdomen is soft.     Tenderness: There is no abdominal tenderness.  Musculoskeletal:     Cervical back: Normal range of motion and neck supple.     Right  lower leg: No edema.     Left lower leg: No edema.  Lymphadenopathy:     Cervical: No cervical adenopathy.  Skin:    General: Skin is warm and dry.  Neurological:     Mental Status: She is alert and oriented to person, place, and time.     Deep Tendon Reflexes: Reflexes are normal and symmetric.     Reflex Scores:      Brachioradialis reflexes are 2+ on the right side and 2+ on the left side.      Patellar reflexes are 2+ on the right side and 2+ on the left side. Psychiatric:        Attention and Perception: Attention normal.        Mood and Affect: Mood normal.        Speech: Speech normal.        Behavior: Behavior normal. Behavior is cooperative.        Thought Content: Thought content normal.     Results for orders placed or performed during the hospital encounter of 03/25/23  Glucose, capillary   Collection Time: 03/25/23  2:14 PM  Result Value Ref Range   Glucose-Capillary 78 70 - 99 mg/dL      Assessment & Plan:   Problem List Items Addressed This Visit       Other   Fatigue   Ongoing, suspect related to possible cancer.  Recommend plenty of rest and fluids at home.  Scheduled to see pulmonary upcoming.  Would like to see [facility]ology, they will not see her until biopsy is performed.      Mass of right lung - Primary   New finding on imaging 03/22/23.  Is scheduled to see pulmonary upcoming.  Discussed with her next steps, including possible biopsy of area to further determine cause.  All questions answered and support provided. Would like to see [facility], they will not see her until biopsy is performed.      Thyroid mass   Noted on PET 03/25/23 with recommendation for biopsy.  ENT referral placed.        Follow up plan: Return in about 6 weeks (around 05/16/2023) for SOB AND CANCER.

## 2023-04-04 NOTE — Progress Notes (Signed)
Contacted via MyChart   Good morning Ann Robinson, your thyroid imaging has returned and there is a nodule they are recommending biopsy of due to the uptake seen on PET.  I will place referral to ENT for this.  We will take each step at a time.  There will be a lot of things going on over next weeks I am sure and I am here to support you:)

## 2023-04-04 NOTE — Assessment & Plan Note (Addendum)
New finding on imaging 03/22/23.  Is scheduled to see pulmonary upcoming.  Discussed with her next steps, including possible biopsy of area to further determine cause.  All questions answered and support provided. Would like to see [facility], they will not see her until biopsy is performed.

## 2023-04-04 NOTE — Assessment & Plan Note (Signed)
Ongoing, suspect related to possible cancer.  Recommend plenty of rest and fluids at home.  Scheduled to see pulmonary upcoming.  Would like to see [facility], they will not see her until biopsy is performed.

## 2023-04-06 DIAGNOSIS — C3491 Malignant neoplasm of unspecified part of right bronchus or lung: Secondary | ICD-10-CM

## 2023-04-06 DIAGNOSIS — R590 Localized enlarged lymph nodes: Secondary | ICD-10-CM

## 2023-04-06 HISTORY — DX: Localized enlarged lymph nodes: R59.0

## 2023-04-06 HISTORY — DX: Malignant neoplasm of unspecified part of right bronchus or lung: C34.91

## 2023-04-12 ENCOUNTER — Ambulatory Visit: Payer: 59 | Admitting: Pulmonary Disease

## 2023-04-12 ENCOUNTER — Telehealth: Payer: Self-pay

## 2023-04-12 ENCOUNTER — Encounter: Payer: Self-pay | Admitting: Pulmonary Disease

## 2023-04-12 VITALS — BP 118/80 | HR 74 | Temp 97.1°F | Ht 68.35 in | Wt 270.6 lb

## 2023-04-12 DIAGNOSIS — R590 Localized enlarged lymph nodes: Secondary | ICD-10-CM | POA: Diagnosis not present

## 2023-04-12 DIAGNOSIS — R918 Other nonspecific abnormal finding of lung field: Secondary | ICD-10-CM

## 2023-04-12 NOTE — Telephone Encounter (Signed)
 For the codes 16109, [id number], [phone] Prior Auth Not Required Refer # [id number]

## 2023-04-12 NOTE — Telephone Encounter (Signed)
 Robotic Bronchoscopy with EBUS 04/25/2023 at 12:30pm Right upper lobe nodule 31627, [id number], [id number]  Synetta Fail please see Bronch info.

## 2023-04-12 NOTE — Progress Notes (Signed)
 Subjective:    Patient ID: Ann Robinson, female    DOB: [birth year], 63 y.o.   MRN: [id number]  Patient Care Team: Marjie Skiff, NP as PCP - General (Nurse Practitioner)  Chief Complaint  Patient presents with   Consult    Nodule.    BACKGROUND: 63 year old lifelong never smoker comes for evaluation of a right upper lobe lung mass.  She is kindly referred by Aura Dials, NP.   HPI Discussed the use of AI scribe software for clinical note transcription with the patient, who gave verbal consent to proceed.  History of Present Illness   The patient, accompanied by her sister, presented with a history of coughing and shortness of breath, particularly upon exertion. She reported feeling weak in the legs after standing for short periods. Initial evaluation by her primary care physician included a chest x-ray, which revealed an abnormality. Subsequent testing has been ongoing, but the patient reported difficulty understanding the results.  The patient denied any history of smoking. She had recently undergone a thyroid biopsy at [facility], but results were not yet available. She also reported a recent PET scan, after which she noticed soreness and a knot-like sensation in her left arm, which felt warm to touch. However, she reported no chest discomfort.  The patient lives alone but has temporarily moved in with her sister in [place] for support and proximity to family. She expressed a desire to see her grandchild due in July, indicating a strong will to fight her illness.     Review of Systems A 10 point review of systems was performed and it is as noted above otherwise negative.   Past Medical History:  Diagnosis Date   Anemia    In the past   Goiter    History of kidney stones    Migraines    Nephrolithiasis 1988   Thyroid disease     Past Surgical History:  Procedure Laterality Date   ABDOMINAL HYSTERECTOMY     CHOLECYSTECTOMY     COLONOSCOPY WITH  PROPOFOL N/A 07/20/2017   Procedure: COLONOSCOPY WITH PROPOFOL;  Surgeon: Toledo, Boykin Nearing, MD;  Location: [facility];  Service: Gastroenterology;  Laterality: N/A;   COLONOSCOPY WITH PROPOFOL N/A 09/26/2020   Procedure: COLONOSCOPY WITH PROPOFOL;  Surgeon: Midge Minium, MD;  Location: [facility];  Service: Endoscopy;  Laterality: N/A;   CYSTOSCOPY N/A 04/21/2016   Procedure: CYSTOSCOPY;  Surgeon: Artelia Laroche, MD;  Location: [facility] OR[facility];  Service: Gynecology;  Laterality: N/A;   ROBOTIC ASSISTED TOTAL HYSTERECTOMY WITH BILATERAL SALPINGO OOPHERECTOMY Bilateral 04/21/2016   Procedure: ROBOTIC ASSISTED TOTAL HYSTERECTOMY WITH BILATERAL SALPINGO OOPHORECTOMY;  Surgeon: Artelia Laroche, MD;  Location: [facility] OR[facility];  Service: Gynecology;  Laterality: Bilateral;   SENTINEL NODE BIOPSY N/A 04/21/2016   Procedure: SENTINEL NODE BIOPSY;  Surgeon: Artelia Laroche, MD;  Location: [facility] OR[facility];  Service: Gynecology;  Laterality: N/A;    Patient Active Problem List   Diagnosis Date Noted   Mass of right lung 03/26/2023   Thyroid mass 03/26/2023   Fatigue 03/21/2023   Other constipation 02/22/2023   H/O migraine 03/16/2021   Hyperlipemia, mixed 03/15/2021   Elevated hemoglobin A1c measurement 03/15/2021   History of colonic polyps    Vitamin D deficiency 02/13/2019   History of robot-assisted laparoscopic hysterectomy 04/21/2016   Polyp of colon 11/18/2014   Hypothyroidism 11/18/2014   Obesity 11/18/2014    Family History  Problem Relation Age of Onset   Cancer Mother  colon and breast   Diabetes Mother    Breast cancer Mother 96   Alzheimer's disease Father    Heart disease Father    Hypertension Father    Diabetes Father    Diabetes Sister    Hypertension Sister    Diabetes Brother    Liver disease Brother    Heart disease Maternal Grandfather        MI   Stroke Paternal Grandmother    Thyroid disease Sister    Diabetes Brother    Breast cancer  Maternal Aunt        late 43's early 70's   Breast cancer Cousin 57    Social History   Tobacco Use   Smoking status: Never   Smokeless tobacco: Never  Substance Use Topics   Alcohol use: Not Currently    Allergies  Allergen Reactions   Sulfa Antibiotics Rash    Current Meds  Medication Sig   Ascorbic Acid (VITAMIN C) 1000 MG tablet Take 1,000 mg by mouth daily.   Cholecalciferol (VITAMIN D) 125 MCG (5000 UT) CAPS Take 1,000 Units by mouth 2 (two) times a week.    levothyroxine (SYNTHROID) 125 MCG tablet Take 125 mcg by mouth daily before breakfast.   rosuvastatin (CRESTOR) 20 MG tablet Take 1 tablet (20 mg total) by mouth daily. (Patient taking differently: Take 20 mg by mouth every other day.)    Immunization History  Administered Date(s) Administered   Influenza Inj Mdck Quad Pf 01/01/2019   Influenza,inj,Quad PF,6+ Mos 02/25/2015, 12/17/2015, 03/16/2021, 01/18/2023   Influenza-Unspecified 12/24/2016, 01/04/2020, 12/30/2021   Moderna Covid-19 Fall Seasonal Vaccine 46yrs & older 01/18/2023   Moderna SARS-COV2 Booster Vaccination 10/21/2020   Moderna Sars-Covid-2 Vaccination 06/25/2019, 07/26/2019   Td 02/16/2002   Tdap 10/09/2012, 03/21/2023        Objective:     BP 118/80 (BP Location: Right Arm, Cuff Size: Large)   Pulse 74   Temp (!) 97.1 F (36.2 C)   Ht 5' 8.35" (1.736 m)   Wt 270 lb 9.6 oz (122.7 kg)   LMP 09/17/2013 (Approximate)   SpO2 98%   BMI 40.72 kg/m   SpO2: 98 % O2 Device: None (Room air)  GENERAL: Obese woman, no acute distress, fully ambulatory.  No conversational dyspnea. HEAD: Normocephalic, atraumatic.  EYES: Pupils equal, round, reactive to light.  No scleral icterus.  MOUTH: Dentition intact, oral mucosa moist.  No thrush. NECK: Supple. No thyromegaly. Trachea midline. No JVD.  No adenopathy. PULMONARY: Good air entry bilaterally.  No adventitious sounds. CARDIOVASCULAR: S1 and S2. Regular rate and rhythm.  No rubs, murmurs or  gallops heard. ABDOMEN: Obese, otherwise benign. MUSCULOSKELETAL: No joint deformity, no clubbing, no edema.  NEUROLOGIC: No overt focal deficit, no gait disturbance, speech is fluent. SKIN: Intact,warm,dry. PSYCH: Mood and behavior normal.  Representative image from CT performed 22 March 2023 right upper lobe lesion:       Representative images from PET/CT performed 25 March 2023 showing hypermetabolic right upper lobe lesion and axillary nodal activity on the left that is also hypermetabolic:      Assessment & Plan:     ICD-10-CM   1. Lung mass  R91.8 CT SUPER D CHEST WO [facility] PILOT    2. Mediastinal adenopathy  R59.0 CT SUPER D CHEST WO [facility] PILOT    3. Axillary adenopathy  R59.0 IR Radiologist Eval & Mgmt      Orders Placed This Encounter  Procedures   IR Radiologist Eval & Mgmt  Patient with right upper lobe lung mass, and left axillary/pectoral adenopathy.  All FDG avid.  Evaluate for potential needle biopsy of one of the lymph nodes on the left axilla/pectoral area.    Standing Status:   Future    Expiration Date:   04/11/2024    Reason for Exam (SYMPTOM  OR DIAGNOSIS REQUIRED):   Left axillary/pectoral adenopathy    Preferred Imaging Location?:   [facility]   CT SUPER D CHEST WO [facility] PILOT    Standing Status:   Future    Expiration Date:   04/11/2024    Scheduling Instructions:     Please do before 25 April 2023.  Patient has robotic assisted navigational bronchoscopy on 20 January.    Preferred imaging location?:   [facility]   Discussion:    Right Upper Lobe Mass Mass in the right upper lobe of the lung, highly concerning for malignancy, likely adenocarcinoma given nonsmoking status. PET avid, indicating high metabolic activity. Discussed robotic-assisted navigational bronchoscopy for biopsy and EBUS for staging. Explained risks (bleeding, infection, potential lung collapse) and benefits (accurate diagnosis and staging).  Alternatives include needle biopsy by radiology. Anticipated outcome: accurate diagnosis and staging to guide treatment. - Schedule robotic-assisted navigational bronchoscopy - Will perform endobronchial ultrasound at the time of procedure to stage the mediastinum - Order CT scan with thin slices for bronchial tree reconstruction ([facility] protocol CT)  Left Axillary and Pectoral Lymphadenopathy Significant lymphadenopathy in the left axillary and pectoral regions, PET avid, suggesting possible metastatic involvement. Discussed needle biopsy by interventional radiology. Explained risks (bleeding, infection) and benefits (accurate diagnosis and staging). Anticipated outcome: confirmation of metastatic disease. - Refer to interventional radiology for axillary lymph node biopsy  Splenic Lesions Multiple splenic lesions identified on imaging, concerning for metastatic disease. Included in overall diagnostic workup and staging process. - Include in overall diagnostic workup and staging process  Thyroid Nodule Thyroid nodule previously biopsied, awaiting results. Shows some activity on PET CT but less concerning compared to other findings. Discussed follow-up on biopsy results. - Follow up on [facility] Maintenance Currently staying with family in [place] for support. Motivated to manage health to be present for new grandchild expected in July. - Ensure family support and assistance with transportation and care post-procedures  Follow-up - Schedule follow-up appointment in 3-4 weeks to review biopsy results and discuss further management - Coordinate with radiology and interventional radiology for timely scheduling of procedures - Discuss potential referral to oncology at [facility] or local cancer center based on patient preference and biopsy results.      Benefits, limitations and potential complications of the procedure were discussed with the  patient/family.  Complications from bronchoscopy are rare and most often minor, but if they occur they may include breathing difficulty, vocal cord spasm, hoarseness, slight fever, vomiting, dizziness, bronchospasm, infection, low blood oxygen, bleeding from biopsy site, or an allergic reaction to medications.  It is uncommon for patients to experience other more serious complications for example: Collapsed lung requiring chest tube placement, respiratory failure, heart attack and/or cardiac arrhythmia.  Patient agrees to proceed.   Advised if symptoms do not improve or worsen, to please contact office for sooner follow up or seek emergency care.    I spent 45 minutes of dedicated to the care of this patient on the date of this encounter to include pre-visit review of records, face-to-face time with the patient discussing conditions above, post visit ordering of testing, clinical  documentation with the electronic health record, making appropriate referrals as documented, and communicating necessary findings to members of the patients care team.   C. Danice Goltz, MD Advanced Bronchoscopy PCCM [facility]    *This note was dictated using voice recognition software/Dragon.  Despite best efforts to proofread, errors can occur which can change the meaning. Any transcriptional errors that result from this process are unintentional and may not be fully corrected at the time of dictation.

## 2023-04-12 NOTE — Telephone Encounter (Signed)
 Pre Admit phone interview on 04/19/2023 between 1pm and 5pm.  The patient is aware.

## 2023-04-12 NOTE — H&P (View-Only) (Signed)
Subjective:    Patient ID: Ann Robinson, female    DOB: [birth year], 63 y.o.   MRN: [id number]  Patient Care Team: Marjie Skiff, NP as PCP - General (Nurse Practitioner)  Chief Complaint  Patient presents with   Consult    Nodule.    BACKGROUND: 63 year old lifelong never smoker comes for evaluation of a right upper lobe lung mass.  She is kindly referred by Aura Dials, NP.   HPI Discussed the use of AI scribe software for clinical note transcription with the patient, who gave verbal consent to proceed.  History of Present Illness   The patient, accompanied by her sister, presented with a history of coughing and shortness of breath, particularly upon exertion. She reported feeling weak in the legs after standing for short periods. Initial evaluation by her primary care physician included a chest x-ray, which revealed an abnormality. Subsequent testing has been ongoing, but the patient reported difficulty understanding the results.  The patient denied any history of smoking. She had recently undergone a thyroid biopsy at [facility], but results were not yet available. She also reported a recent PET scan, after which she noticed soreness and a knot-like sensation in her left arm, which felt warm to touch. However, she reported no chest discomfort.  The patient lives alone but has temporarily moved in with her sister in [place] for support and proximity to family. She expressed a desire to see her grandchild due in July, indicating a strong will to fight her illness.     Review of Systems A 10 point review of systems was performed and it is as noted above otherwise negative.   Past Medical History:  Diagnosis Date   Anemia    In the past   Goiter    History of kidney stones    Migraines    Nephrolithiasis 1988   Thyroid disease     Past Surgical History:  Procedure Laterality Date   ABDOMINAL HYSTERECTOMY     CHOLECYSTECTOMY     COLONOSCOPY WITH  PROPOFOL N/A 07/20/2017   Procedure: COLONOSCOPY WITH PROPOFOL;  Surgeon: Toledo, Boykin Nearing, MD;  Location: [facility];  Service: Gastroenterology;  Laterality: N/A;   COLONOSCOPY WITH PROPOFOL N/A 09/26/2020   Procedure: COLONOSCOPY WITH PROPOFOL;  Surgeon: Midge Minium, MD;  Location: [facility];  Service: Endoscopy;  Laterality: N/A;   CYSTOSCOPY N/A 04/21/2016   Procedure: CYSTOSCOPY;  Surgeon: Artelia Laroche, MD;  Location: [facility] OR[facility];  Service: Gynecology;  Laterality: N/A;   ROBOTIC ASSISTED TOTAL HYSTERECTOMY WITH BILATERAL SALPINGO OOPHERECTOMY Bilateral 04/21/2016   Procedure: ROBOTIC ASSISTED TOTAL HYSTERECTOMY WITH BILATERAL SALPINGO OOPHORECTOMY;  Surgeon: Artelia Laroche, MD;  Location: [facility];  Service: Gynecology;  Laterality: Bilateral;   SENTINEL NODE BIOPSY N/A 04/21/2016   Procedure: SENTINEL NODE BIOPSY;  Surgeon: Artelia Laroche, MD;  Location: [facility] OR[facility];  Service: Gynecology;  Laterality: N/A;    Patient Active Problem List   Diagnosis Date Noted   Mass of right lung 03/26/2023   Thyroid mass 03/26/2023   Fatigue 03/21/2023   Other constipation 02/22/2023   H/O migraine 03/16/2021   Hyperlipemia, mixed 03/15/2021   Elevated hemoglobin A1c measurement 03/15/2021   History of colonic polyps    Vitamin D deficiency 02/13/2019   History of robot-assisted laparoscopic hysterectomy 04/21/2016   Polyp of colon 11/18/2014   Hypothyroidism 11/18/2014   Obesity 11/18/2014    Family History  Problem Relation Age of Onset   Cancer Mother  colon and breast   Diabetes Mother    Breast cancer Mother 96   Alzheimer's disease Father    Heart disease Father    Hypertension Father    Diabetes Father    Diabetes Sister    Hypertension Sister    Diabetes Brother    Liver disease Brother    Heart disease Maternal Grandfather        MI   Stroke Paternal Grandmother    Thyroid disease Sister    Diabetes Brother    Breast cancer  Maternal Aunt        late 43's early 70's   Breast cancer Cousin 57    Social History   Tobacco Use   Smoking status: Never   Smokeless tobacco: Never  Substance Use Topics   Alcohol use: Not Currently    Allergies  Allergen Reactions   Sulfa Antibiotics Rash    Current Meds  Medication Sig   Ascorbic Acid (VITAMIN C) 1000 MG tablet Take 1,000 mg by mouth daily.   Cholecalciferol (VITAMIN D) 125 MCG (5000 UT) CAPS Take 1,000 Units by mouth 2 (two) times a week.    levothyroxine (SYNTHROID) 125 MCG tablet Take 125 mcg by mouth daily before breakfast.   rosuvastatin (CRESTOR) 20 MG tablet Take 1 tablet (20 mg total) by mouth daily. (Patient taking differently: Take 20 mg by mouth every other day.)    Immunization History  Administered Date(s) Administered   Influenza Inj Mdck Quad Pf 01/01/2019   Influenza,inj,Quad PF,6+ Mos 02/25/2015, 12/17/2015, 03/16/2021, 01/18/2023   Influenza-Unspecified 12/24/2016, 01/04/2020, 12/30/2021   Moderna Covid-19 Fall Seasonal Vaccine 46yrs & older 01/18/2023   Moderna SARS-COV2 Booster Vaccination 10/21/2020   Moderna Sars-Covid-2 Vaccination 06/25/2019, 07/26/2019   Td 02/16/2002   Tdap 10/09/2012, 03/21/2023        Objective:     BP 118/80 (BP Location: Right Arm, Cuff Size: Large)   Pulse 74   Temp (!) 97.1 F (36.2 C)   Ht 5' 8.35" (1.736 m)   Wt 270 lb 9.6 oz (122.7 kg)   LMP 09/17/2013 (Approximate)   SpO2 98%   BMI 40.72 kg/m   SpO2: 98 % O2 Device: None (Room air)  GENERAL: Obese woman, no acute distress, fully ambulatory.  No conversational dyspnea. HEAD: Normocephalic, atraumatic.  EYES: Pupils equal, round, reactive to light.  No scleral icterus.  MOUTH: Dentition intact, oral mucosa moist.  No thrush. NECK: Supple. No thyromegaly. Trachea midline. No JVD.  No adenopathy. PULMONARY: Good air entry bilaterally.  No adventitious sounds. CARDIOVASCULAR: S1 and S2. Regular rate and rhythm.  No rubs, murmurs or  gallops heard. ABDOMEN: Obese, otherwise benign. MUSCULOSKELETAL: No joint deformity, no clubbing, no edema.  NEUROLOGIC: No overt focal deficit, no gait disturbance, speech is fluent. SKIN: Intact,warm,dry. PSYCH: Mood and behavior normal.  Representative image from CT performed 22 March 2023 right upper lobe lesion:       Representative images from PET/CT performed 25 March 2023 showing hypermetabolic right upper lobe lesion and axillary nodal activity on the left that is also hypermetabolic:      Assessment & Plan:     ICD-10-CM   1. Lung mass  R91.8 CT SUPER D CHEST WO [facility] PILOT    2. Mediastinal adenopathy  R59.0 CT SUPER D CHEST WO [facility] PILOT    3. Axillary adenopathy  R59.0 IR Radiologist Eval & Mgmt      Orders Placed This Encounter  Procedures   IR Radiologist Eval & Mgmt  Patient with right upper lobe lung mass, and left axillary/pectoral adenopathy.  All FDG avid.  Evaluate for potential needle biopsy of one of the lymph nodes on the left axilla/pectoral area.    Standing Status:   Future    Expiration Date:   04/11/2024    Reason for Exam (SYMPTOM  OR DIAGNOSIS REQUIRED):   Left axillary/pectoral adenopathy    Preferred Imaging Location?:   [facility]   CT SUPER D CHEST WO [facility] PILOT    Standing Status:   Future    Expiration Date:   04/11/2024    Scheduling Instructions:     Please do before 25 April 2023.  Patient has robotic assisted navigational bronchoscopy on 20 January.    Preferred imaging location?:   [facility]   Discussion:    Right Upper Lobe Mass Mass in the right upper lobe of the lung, highly concerning for malignancy, likely adenocarcinoma given nonsmoking status. PET avid, indicating high metabolic activity. Discussed robotic-assisted navigational bronchoscopy for biopsy and EBUS for staging. Explained risks (bleeding, infection, potential lung collapse) and benefits (accurate diagnosis and staging).  Alternatives include needle biopsy by radiology. Anticipated outcome: accurate diagnosis and staging to guide treatment. - Schedule robotic-assisted navigational bronchoscopy - Will perform endobronchial ultrasound at the time of procedure to stage the mediastinum - Order CT scan with thin slices for bronchial tree reconstruction ([facility] protocol CT)  Left Axillary and Pectoral Lymphadenopathy Significant lymphadenopathy in the left axillary and pectoral regions, PET avid, suggesting possible metastatic involvement. Discussed needle biopsy by interventional radiology. Explained risks (bleeding, infection) and benefits (accurate diagnosis and staging). Anticipated outcome: confirmation of metastatic disease. - Refer to interventional radiology for axillary lymph node biopsy  Splenic Lesions Multiple splenic lesions identified on imaging, concerning for metastatic disease. Included in overall diagnostic workup and staging process. - Include in overall diagnostic workup and staging process  Thyroid Nodule Thyroid nodule previously biopsied, awaiting results. Shows some activity on PET CT but less concerning compared to other findings. Discussed follow-up on biopsy results. - Follow up on biopsy results from [facility] Maintenance Currently staying with family in [place] for support. Motivated to manage health to be present for new grandchild expected in July. - Ensure family support and assistance with transportation and care post-procedures  Follow-up - Schedule follow-up appointment in 3-4 weeks to review biopsy results and discuss further management - Coordinate with radiology and interventional radiology for timely scheduling of procedures - Discuss potential referral to oncology at [facility] or local cancer center based on patient preference and biopsy results.      Benefits, limitations and potential complications of the procedure were discussed with the  patient/family.  Complications from bronchoscopy are rare and most often minor, but if they occur they may include breathing difficulty, vocal cord spasm, hoarseness, slight fever, vomiting, dizziness, bronchospasm, infection, low blood oxygen, bleeding from biopsy site, or an allergic reaction to medications.  It is uncommon for patients to experience other more serious complications for example: Collapsed lung requiring chest tube placement, respiratory failure, heart attack and/or cardiac arrhythmia.  Patient agrees to proceed.   Advised if symptoms do not improve or worsen, to please contact office for sooner follow up or seek emergency care.    I spent 45 minutes of dedicated to the care of this patient on the date of this encounter to include pre-visit review of records, face-to-face time with the patient discussing conditions above, post visit ordering of testing, clinical  documentation with the electronic health record, making appropriate referrals as documented, and communicating necessary findings to members of the patients care team.   C. Danice Goltz, MD Advanced Bronchoscopy PCCM [facility]    *This note was dictated using voice recognition software/Dragon.  Despite best efforts to proofread, errors can occur which can change the meaning. Any transcriptional errors that result from this process are unintentional and may not be fully corrected at the time of dictation.

## 2023-04-12 NOTE — Patient Instructions (Signed)
 VISIT SUMMARY:  Today, we discussed your recent symptoms of coughing, shortness of breath, and leg weakness. We reviewed the findings from your chest x-ray and PET scan, and we talked about the next steps for further diagnosis and treatment. We also discussed your thyroid  biopsy and the soreness in your left arm. You are currently staying with your sister for support, and we are committed to helping you manage your health so you can be there for your new grandchild.  YOUR PLAN:  -RIGHT UPPER LOBE MASS: You have a mass in the right upper part of your lung that is highly concerning for cancer, likely adenocarcinoma, which is a type of lung cancer common in non-smokers. We will schedule a robotic-assisted bronchoscopy to take a biopsy and stage the mass, which will help us  determine the best treatment plan. This procedure has some risks, such as bleeding and infection, but it will provide an accurate diagnosis.  -LEFT AXILLARY AND PECTORAL LYMPHADENOPATHY: You have significant swelling in the lymph nodes in your left armpit and chest area, which may indicate that the cancer has spread. We will refer you to interventional radiology for a needle biopsy to confirm this. The biopsy will help us  understand the extent of the disease and plan the appropriate treatment.  -SPLENIC LESIONS: There are multiple lesions in your spleen that could be related to the spread of cancer. These will be included in the overall diagnostic workup and staging process to get a complete picture of your condition.  -THYROID  NODULE: You have a nodule in your thyroid  that was recently biopsied, and we are waiting for the results. This nodule showed some activity on the PET scan but is less concerning than the other findings. We will follow up on the biopsy results to determine if any further action is needed.  -GENERAL HEALTH MAINTENANCE: You are currently staying with your family for support, which is important for your overall  well-being. Staying motivated and having family assistance will help you manage your health better, especially after your procedures.  INSTRUCTIONS:  Please schedule a follow-up appointment in 3-4 weeks to review your biopsy results and discuss further management. We will also coordinate with radiology and interventional radiology to schedule your procedures as soon as possible. Depending on the biopsy results, we may refer you to an oncologist at [facility] or a local cancer center based on your preference.    We discussed that the procedure would have to be done under general anesthesia. The anesthesia team will discuss his part of the process with you.  Complications from the procedure itself are usually minor. One potential complication would be collapse of the lung which can occur in 2-3% of the cases. If  this happens we would have to put a small tube to relieve the collapse and you would have to spend the night in the hospital in that event. Another possibility would be that of bleeding, this is usually taken care of during the procedure.  In the situations you may need to be observed overnight.  For the most part though, should be able to go home the same day.  Other possibilities could include that the procedure would be nondiagnostic meaning that no definitive diagnosis could be attained.  We strive to try to decrease these potential issues.

## 2023-04-18 NOTE — Progress Notes (Signed)
 Johann Sieving, MD sent to Carlie Hoose S PROCEDURE / BIOPSY REVIEW Date: 04/18/23  Requested Biopsy site: L axillary LAN Reason for request: R lung lesion, r/o mets Imaging review: Best seen on PET 03/25/23  Decision: Approved Imaging modality to perform: Ultrasound Schedule with: Patient preference (Local vs Mod Sed) Schedule for: Any VIR  Additional comments:   Please contact me with questions, concerns, or if issue pertaining to this request arise.  Dayne Sieving Johann, MD [facility]

## 2023-04-19 ENCOUNTER — Encounter
Admission: RE | Admit: 2023-04-19 | Discharge: 2023-04-19 | Disposition: A | Discharge status: Home / Self Care | Payer: 59 | Source: Ambulatory Visit | Attending: Pulmonary Disease | Admitting: Pulmonary Disease

## 2023-04-19 ENCOUNTER — Other Ambulatory Visit: Payer: Self-pay

## 2023-04-19 HISTORY — DX: Family history of other specified conditions: Z84.89

## 2023-04-19 HISTORY — DX: Prediabetes: R73.03

## 2023-04-19 NOTE — Patient Instructions (Signed)
 Your procedure is scheduled on: Monday 04/25/23 To find out your arrival time, please call [phone] between 1PM - 3PM on:   Friday 04/22/23 Report to the Registration Desk on the 1st floor of the Medical Mall. FREE Valet parking is available.  If your arrival time is 6:00 am, do not arrive before that time as the [facility] entrance doors do not open until 6:00 am.  REMEMBER: Instructions that are not followed completely may result in serious medical risk, up to and including death; or upon the discretion of your surgeon and anesthesiologist your surgery may need to be rescheduled.  Do not eat food or drink any liquids after midnight the night before surgery.  No gum chewing or hard candies.  One week prior to surgery: Stop Anti-inflammatories (NSAIDS) such as Advil , Aleve, Ibuprofen , Motrin , Naproxen, Naprosyn and Aspirin based products such as Excedrin, Goody's Powder, BC Powder. You may however, continue to take Tylenol  if needed for pain up until the day of surgery.  Stop ANY OVER THE COUNTER supplements and vitamins until after surgery.  Continue taking all prescribed medications.   TAKE ONLY THESE MEDICATIONS THE MORNING OF SURGERY WITH A SIP OF WATER :  levothyroxine  (SYNTHROID ) 125 MCG tablet  rosuvastatin  (CRESTOR ) 20 MG tablet if scheduled day  No Alcohol for 24 hours before or after surgery.  No Smoking including e-cigarettes for 24 hours before surgery.  No chewable tobacco products for at least 6 hours before surgery.  No nicotine patches on the day of surgery.  Do not use any recreational drugs for at least a week (preferably 2 weeks) before your surgery.  Please be advised that the combination of cocaine and anesthesia may have negative outcomes, up to and including death. If you test positive for cocaine, your surgery will be cancelled.  On the morning of surgery brush your teeth with toothpaste and water , you may rinse your mouth with mouthwash if you  wish. Do not swallow any toothpaste or mouthwash.  Use CHG Soap or wipes as directed on instruction sheet. Shower with your regular soap  Do not wear lotions, powders, or perfumes.   Do not shave body hair from the neck down 48 hours before surgery.  Wear clean comfortable clothing (specific to your surgery type) to the hospital.  Do not wear jewelry, make-up, hairpins, clips or nail polish.  For welded (permanent) jewelry: bracelets, anklets, waist bands, etc.  Please have this removed prior to surgery.  If it is not removed, there is a chance that hospital personnel will need to cut it off on the day of surgery. Contact lenses, hearing aids and dentures may not be worn into surgery.  Do not bring valuables to the hospital. [facility] is not responsible for any missing/lost belongings or valuables.   Notify your doctor if there is any change in your medical condition (cold, fever, infection).  If you are being discharged the day of surgery, you will not be allowed to drive home. You will need a responsible individual to drive you home and stay with you for 24 hours after surgery.   If you are taking public transportation, you will need to have a responsible individual with you.  If you are being admitted to the hospital overnight, leave your suitcase in the car. After surgery it may be brought to your room.  In case of increased patient census, it may be necessary for you, the patient, to continue your postoperative care in the Same Day Surgery department.  After surgery, you can help prevent lung complications by doing breathing exercises.  Take deep breaths and cough every 1-2 hours. Your doctor may order a device called an Incentive Spirometer to help you take deep breaths. When coughing or sneezing, hold a pillow firmly against your incision with both hands. This is called "splinting." Doing this helps protect your incision. It also decreases belly discomfort.  Surgery  Visitation Policy:  Patients undergoing a surgery or procedure may have two family members or support persons with them as long as the person is not COVID-19 positive or experiencing its symptoms.   Inpatient Visitation:    Visiting hours are 7 a.m. to 8 p.m. Up to four visitors are allowed at one time in a patient room. The visitors may rotate out with other people during the day. One designated support person (adult) may remain overnight.  Due to an increase in RSV and influenza rates and associated hospitalizations, children ages 36 and under will not be able to visit patients in [facility] - . Masks continue to be strongly recommended.  Please call the Pre-admissions Testing Dept. at [phone] if you have any questions about these instructions.

## 2023-04-19 NOTE — Progress Notes (Signed)
 Patient for US  guided LT axillary LN biopsy on Wed 04/20/2023, I called and spoke with the patient on the phone and gave pre-procedure instructions. Pt was made aware to be here at 12:30p and check in at the [facility] registration desk. Pt stated understanding.  Called 04/18/23

## 2023-04-20 ENCOUNTER — Telehealth: Payer: Self-pay

## 2023-04-20 ENCOUNTER — Ambulatory Visit
Admission: RE | Admit: 2023-04-20 | Discharge: 2023-04-20 | Disposition: A | Discharge status: Home / Self Care | Payer: 59 | Source: Ambulatory Visit | Attending: Pulmonary Disease | Admitting: Pulmonary Disease

## 2023-04-20 DIAGNOSIS — D763 Other histiocytosis syndromes: Secondary | ICD-10-CM | POA: Diagnosis not present

## 2023-04-20 DIAGNOSIS — R918 Other nonspecific abnormal finding of lung field: Secondary | ICD-10-CM | POA: Diagnosis not present

## 2023-04-20 DIAGNOSIS — R590 Localized enlarged lymph nodes: Secondary | ICD-10-CM | POA: Insufficient documentation

## 2023-04-20 MED ORDER — LIDOCAINE HCL (PF) 1 % IJ SOLN
10.0000 mL | Freq: Once | INTRAMUSCULAR | Status: AC
  Administered 2023-04-20: 10 mL via SUBCUTANEOUS
  Filled 2023-04-20: qty 10
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
## 2023-04-20 NOTE — Procedures (Signed)
  Procedure:  US  core biopsy L axillary LAN 18g x4 in saline Preprocedure diagnosis: The encounter diagnosis was Axillary adenopathy. Postprocedure diagnosis: same EBL:    minimal Complications:   none immediate  See full dictation in [employer].  Nicky Barrack MD Main # [phone] Pager  [phone] Mobile [phone]

## 2023-04-21 ENCOUNTER — Ambulatory Visit
Admission: RE | Admit: 2023-04-21 | Discharge: 2023-04-21 | Disposition: A | Discharge status: Home / Self Care | Payer: 59 | Source: Ambulatory Visit | Attending: Pulmonary Disease | Admitting: Pulmonary Disease

## 2023-04-21 DIAGNOSIS — R918 Other nonspecific abnormal finding of lung field: Secondary | ICD-10-CM | POA: Insufficient documentation

## 2023-04-21 DIAGNOSIS — R590 Localized enlarged lymph nodes: Secondary | ICD-10-CM | POA: Insufficient documentation

## 2023-04-21 MED ORDER — IOHEXOL 300 MG/ML  SOLN: 80.0000 mL | Freq: Once | INTRAMUSCULAR | Status: DC | PRN

## 2023-04-24 MED ORDER — IPRATROPIUM-ALBUTEROL 0.5-2.5 (3) MG/3ML IN SOLN
3.0000 mL | Freq: Once | RESPIRATORY_TRACT | Status: AC
  Administered 2023-04-25: 3 mL via RESPIRATORY_TRACT

## 2023-04-24 MED ORDER — CHLORHEXIDINE GLUCONATE 0.12 % MT SOLN
15.0000 mL | Freq: Once | OROMUCOSAL | Status: AC
  Administered 2023-04-25: 15 mL via OROMUCOSAL

## 2023-04-24 MED ORDER — ORAL CARE MOUTH RINSE: 15.0000 mL | Freq: Once | OROMUCOSAL | Status: AC

## 2023-04-24 MED ORDER — SODIUM CHLORIDE 0.9 % IV SOLN: Freq: Once | INTRAVENOUS | Status: DC

## 2023-04-25 ENCOUNTER — Ambulatory Visit: Payer: 59 | Admitting: Anesthesiology

## 2023-04-25 ENCOUNTER — Other Ambulatory Visit: Payer: Self-pay

## 2023-04-25 ENCOUNTER — Ambulatory Visit: Payer: 59 | Admitting: Urgent Care

## 2023-04-25 ENCOUNTER — Ambulatory Visit: Payer: 59

## 2023-04-25 ENCOUNTER — Ambulatory Visit
Admission: RE | Admit: 2023-04-25 | Discharge: 2023-04-25 | Disposition: A | Discharge status: Home / Self Care | Payer: 59 | Attending: Pulmonary Disease | Admitting: Pulmonary Disease

## 2023-04-25 ENCOUNTER — Encounter
Admission: RE | Disposition: A | Discharge status: Home / Self Care | Payer: Self-pay | Source: Home / Self Care | Attending: Pulmonary Disease

## 2023-04-25 DIAGNOSIS — C3411 Malignant neoplasm of upper lobe, right bronchus or lung: Secondary | ICD-10-CM | POA: Diagnosis not present

## 2023-04-25 DIAGNOSIS — E041 Nontoxic single thyroid nodule: Secondary | ICD-10-CM | POA: Diagnosis not present

## 2023-04-25 DIAGNOSIS — R590 Localized enlarged lymph nodes: Secondary | ICD-10-CM | POA: Insufficient documentation

## 2023-04-25 DIAGNOSIS — R918 Other nonspecific abnormal finding of lung field: Secondary | ICD-10-CM | POA: Insufficient documentation

## 2023-04-25 HISTORY — PX: ENDOBRONCHIAL ULTRASOUND: SHX5096

## 2023-04-25 SURGERY — ROBOTIC ASSISTED NAVIGATIONAL BRONCHOSCOPY
Anesthesia: General | Laterality: Right

## 2023-04-25 MED ORDER — IPRATROPIUM-ALBUTEROL 0.5-2.5 (3) MG/3ML IN SOLN
RESPIRATORY_TRACT | Status: AC
  Filled 2023-04-25: qty 3

## 2023-04-25 MED ORDER — ROCURONIUM BROMIDE 10 MG/ML (PF) SYRINGE
PREFILLED_SYRINGE | INTRAVENOUS | Status: AC
  Filled 2023-04-25: qty 10

## 2023-04-25 MED ORDER — DEXAMETHASONE SODIUM PHOSPHATE 10 MG/ML IJ SOLN
INTRAMUSCULAR | Status: AC
  Filled 2023-04-25: qty 1

## 2023-04-25 MED ORDER — DEXMEDETOMIDINE HCL IN NACL 80 MCG/20ML IV SOLN
INTRAVENOUS | Status: AC
  Filled 2023-04-25: qty 20

## 2023-04-25 MED ORDER — GLYCOPYRROLATE 0.2 MG/ML IJ SOLN
INTRAMUSCULAR | Status: DC | PRN
  Administered 2023-04-25: .2 mg via INTRAVENOUS

## 2023-04-25 MED ORDER — PHENYLEPHRINE 80 MCG/ML (10ML) SYRINGE FOR IV PUSH (FOR BLOOD PRESSURE SUPPORT)
PREFILLED_SYRINGE | INTRAVENOUS | Status: AC
  Filled 2023-04-25: qty 10

## 2023-04-25 MED ORDER — FENTANYL CITRATE (PF) 100 MCG/2ML IJ SOLN
INTRAMUSCULAR | Status: DC | PRN
  Administered 2023-04-25: 25 ug via INTRAVENOUS
  Administered 2023-04-25: 75 ug via INTRAVENOUS

## 2023-04-25 MED ORDER — FENTANYL CITRATE (PF) 100 MCG/2ML IJ SOLN
INTRAMUSCULAR | Status: AC
  Filled 2023-04-25: qty 2

## 2023-04-25 MED ORDER — SODIUM CHLORIDE 0.9 % IV BOLUS
500.0000 mL | Freq: Once | INTRAVENOUS | Status: AC
  Administered 2023-04-25: 500 mL via INTRAVENOUS

## 2023-04-25 MED ORDER — CHLORHEXIDINE GLUCONATE 0.12 % MT SOLN
OROMUCOSAL | Status: AC
  Filled 2023-04-25: qty 15

## 2023-04-25 MED ORDER — LIDOCAINE HCL (CARDIAC) PF 100 MG/5ML IV SOSY
PREFILLED_SYRINGE | INTRAVENOUS | Status: DC | PRN
  Administered 2023-04-25: 100 mg via INTRAVENOUS

## 2023-04-25 MED ORDER — PHENYLEPHRINE 80 MCG/ML (10ML) SYRINGE FOR IV PUSH (FOR BLOOD PRESSURE SUPPORT)
PREFILLED_SYRINGE | INTRAVENOUS | Status: DC | PRN
  Administered 2023-04-25 (×3): 80 ug via INTRAVENOUS

## 2023-04-25 MED ORDER — ROCURONIUM BROMIDE 100 MG/10ML IV SOLN
INTRAVENOUS | Status: DC | PRN
  Administered 2023-04-25: 100 mg via INTRAVENOUS

## 2023-04-25 MED ORDER — ONDANSETRON HCL 4 MG/2ML IJ SOLN: 4.0000 mg | Freq: Once | INTRAMUSCULAR | Status: DC | PRN

## 2023-04-25 MED ORDER — GLYCOPYRROLATE 0.2 MG/ML IJ SOLN
INTRAMUSCULAR | Status: AC
  Filled 2023-04-25: qty 1

## 2023-04-25 MED ORDER — SUGAMMADEX SODIUM 200 MG/2ML IV SOLN
INTRAVENOUS | Status: DC | PRN
  Administered 2023-04-25 (×4): 100 mg via INTRAVENOUS

## 2023-04-25 MED ORDER — ONDANSETRON HCL 4 MG/2ML IJ SOLN
INTRAMUSCULAR | Status: AC
  Filled 2023-04-25: qty 2

## 2023-04-25 MED ORDER — ONDANSETRON HCL 4 MG/2ML IJ SOLN
INTRAMUSCULAR | Status: DC | PRN
  Administered 2023-04-25: 4 mg via INTRAVENOUS

## 2023-04-25 MED ORDER — LIDOCAINE HCL (PF) 2 % IJ SOLN
INTRAMUSCULAR | Status: AC
  Filled 2023-04-25: qty 5

## 2023-04-25 MED ORDER — SODIUM CHLORIDE 0.9 % IV SOLN: INTRAVENOUS | Status: DC | PRN

## 2023-04-25 MED ORDER — PROPOFOL 10 MG/ML IV BOLUS
INTRAVENOUS | Status: DC | PRN
  Administered 2023-04-25: 100 mg via INTRAVENOUS
  Administered 2023-04-25: 20 mg via INTRAVENOUS
  Administered 2023-04-25: 40 mg via INTRAVENOUS

## 2023-04-25 MED ORDER — HYDROCOD POLI-CHLORPHE POLI ER 10-8 MG/5ML PO SUER
5.0000 mL | Freq: Once | ORAL | Status: AC
Start: 2023-04-25 — End: 2023-04-25
  Administered 2023-04-25: 5 mL via ORAL
  Filled 2023-04-25: qty 5

## 2023-04-25 MED ORDER — PROPOFOL 10 MG/ML IV BOLUS
INTRAVENOUS | Status: AC
  Filled 2023-04-25: qty 40

## 2023-04-25 MED ORDER — DEXAMETHASONE SODIUM PHOSPHATE 10 MG/ML IJ SOLN
INTRAMUSCULAR | Status: DC | PRN
  Administered 2023-04-25: 10 mg via INTRAVENOUS

## 2023-04-25 MED ORDER — BENZONATATE 100 MG PO CAPS: 100.0000 mg | ORAL_CAPSULE | Freq: Three times a day (TID) | ORAL | 0 refills | Status: DC | PRN

## 2023-04-25 MED ORDER — DEXMEDETOMIDINE HCL IN NACL 80 MCG/20ML IV SOLN
INTRAVENOUS | Status: DC | PRN
  Administered 2023-04-25 (×2): 12 ug via INTRAVENOUS
  Administered 2023-04-25: 8 ug via INTRAVENOUS

## 2023-04-25 MED ORDER — PHENOL 1.4 % MT LIQD
1.0000 | OROMUCOSAL | Status: DC | PRN
  Administered 2023-04-25: 1 via OROMUCOSAL
  Filled 2023-04-25: qty 177

## 2023-04-25 MED ORDER — FENTANYL CITRATE (PF) 100 MCG/2ML IJ SOLN: 25.0000 ug | INTRAMUSCULAR | Status: DC | PRN

## 2023-04-25 NOTE — Anesthesia Preprocedure Evaluation (Signed)
Anesthesia Evaluation  Patient identified by MRN, date of birth, ID band Patient awake    Reviewed: Allergy & Precautions, NPO status , Patient's Chart, lab work & pertinent test results  History of Anesthesia Complications (+) Family history of anesthesia reaction  Airway Mallampati: II  TM Distance: >3 FB Neck ROM: full    Dental  (+) Teeth Intact   Pulmonary neg pulmonary ROS   Pulmonary exam normal breath sounds clear to auscultation       Cardiovascular Exercise Tolerance: Good negative cardio ROS Normal cardiovascular exam Rhythm:Regular Rate:Normal     Neuro/Psych  Headaches negative neurological ROS  negative psych ROS   GI/Hepatic negative GI ROS, Neg liver ROS,,,  Endo/Other  negative endocrine ROS    Renal/GU Renal disease  negative genitourinary   Musculoskeletal   Abdominal  (+) + obese  Peds negative pediatric ROS (+)  Hematology negative hematology ROS (+) Blood dyscrasia, anemia   Anesthesia Other Findings Past Medical History: No date: Anemia     Comment:  In the past No date: Family history of adverse reaction to anesthesia     Comment:  nephew passed away during palate surgery and it was               thought to be related to anesthesia No date: Goiter No date: History of kidney stones No date: Migraines 1988: Nephrolithiasis No date: Pre-diabetes No date: Thyroid disease  Past Surgical History: No date: ABDOMINAL HYSTERECTOMY No date: CHOLECYSTECTOMY 07/20/2017: COLONOSCOPY WITH PROPOFOL; N/A     Comment:  Procedure: COLONOSCOPY WITH PROPOFOL;  Surgeon: [place],               Boykin Nearing, MD;  Location: [facility];  Service:               Gastroenterology;  Laterality: N/A; 09/26/2020: COLONOSCOPY WITH PROPOFOL; N/A     Comment:  Procedure: COLONOSCOPY WITH PROPOFOL;  Surgeon: Midge Minium, MD;  Location: [facility];  Service:               Endoscopy;   Laterality: N/A; 04/21/2016: CYSTOSCOPY; N/A     Comment:  Procedure: CYSTOSCOPY;  Surgeon: Artelia Laroche,              MD;  Location: [facility] ORS;  Service: Gynecology;                Laterality: N/A; No date: LITHOTRIPSY 04/21/2016: ROBOTIC ASSISTED TOTAL HYSTERECTOMY WITH BILATERAL  SALPINGO OOPHERECTOMY; Bilateral     Comment:  Procedure: ROBOTIC ASSISTED TOTAL HYSTERECTOMY WITH               BILATERAL SALPINGO OOPHORECTOMY;  Surgeon: Artelia Laroche, MD;  Location: [facility] ORS;  Service:               Gynecology;  Laterality: Bilateral; 04/21/2016: SENTINEL NODE BIOPSY; N/A     Comment:  Procedure: SENTINEL NODE BIOPSY;  Surgeon: Artelia Laroche, MD;  Location: [facility] OR[facility];  Service:               Gynecology;  Laterality: N/A;     Reproductive/Obstetrics negative OB ROS  Anesthesia Physical Anesthesia Plan  ASA: 2  Anesthesia Plan: General   Post-op Pain Management:    Induction: Intravenous  PONV Risk Score and Plan: Ondansetron, Dexamethasone, Midazolam and Treatment may vary due to age or medical condition  Airway Management Planned: Oral ETT  Additional Equipment:   Intra-op Plan:   Post-operative Plan: Extubation in OR  Informed Consent: I have reviewed the patients History and Physical, chart, labs and discussed the procedure including the risks, benefits and alternatives for the proposed anesthesia with the patient or authorized representative who has indicated his/her understanding and acceptance.     Dental Advisory Given  Plan Discussed with: CRNA  Anesthesia Plan Comments:        Anesthesia Quick Evaluation

## 2023-04-25 NOTE — Interval H&P Note (Signed)
Ann Robinson has presented today for surgery, with the diagnosis of RIGHT upper lobe mass/RIGHT hilar adenopathy.  The various methods of treatment have been discussed with the patient and family. After consideration of risks, benefits and other options for treatment, the patient has consented to  Procedure(s): ROBOTIC ASSISTED NAVIGATIONAL BRONCHOSCOPY AND ENDOBRONCHIAL ULTRASOUND WITH TBNA-RIGHT as a surgical intervention.  The patient's history has been reviewed, patient examined, no change in status, stable for surgery.  I have reviewed the patient's chart and labs.  Questions were answered to the patient's satisfaction.  Benefits, limitations and potential complications of the procedure were discussed with the patient/family.  Complications from bronchoscopy are rare and most often minor, but if they occur they may include breathing difficulty, vocal cord spasm, hoarseness, slight fever, vomiting, dizziness, bronchospasm, infection, low blood oxygen, bleeding from biopsy site, or an allergic reaction to medications.  It is uncommon for patients to experience other more serious complications for example: Collapsed lung requiring chest tube placement, respiratory failure, heart attack and/or cardiac arrhythmia.  Patient agrees to proceed.  Gailen Shelter, MD Advanced Bronchoscopy PCCM [facility]    *This note was generated using voice recognition software/Dragon and/or AI transcription program.  Despite best efforts to proofread, errors can occur which can change the meaning. Any transcriptional errors that result from this process are unintentional and may not be fully corrected at the time of dictation.

## 2023-04-25 NOTE — Anesthesia Postprocedure Evaluation (Signed)
Anesthesia Post Note  Patient: Ann Robinson  Procedure(s) Performed: ROBOTIC ASSISTED NAVIGATIONAL BRONCHOSCOPY (Right) ENDOBRONCHIAL ULTRASOUND (Right)  Patient location during evaluation: PACU Anesthesia Type: General Level of consciousness: awake Pain management: satisfactory to patient Vital Signs Assessment: post-procedure vital signs reviewed and stable Cardiovascular status: stable Anesthetic complications: no   No notable events documented.   Last Vitals:  Vitals:   04/25/23 1430 04/25/23 1435  BP: (!) 83/55 (!) 83/51  Pulse: 72 65  Resp: 19 11  Temp: (!) 36.1 C   SpO2: 100% 99%    Last Pain:  Vitals:   04/25/23 1415  PainSc: 0-No pain                 VAN STAVEREN,Dashawn Golda

## 2023-04-25 NOTE — Transfer of Care (Signed)
Immediate Anesthesia Transfer of Care Note  Patient: Ann Robinson  Procedure(s) Performed: ROBOTIC ASSISTED NAVIGATIONAL BRONCHOSCOPY (Right) ENDOBRONCHIAL ULTRASOUND (Right)  Patient Location: PACU  Anesthesia Type:General  Level of Consciousness: awake, alert , oriented, and patient cooperative  Airway & Oxygen Therapy: Patient Spontanous Breathing  Post-op Assessment: Report given to RN and Post -op Vital signs reviewed and stable  Post vital signs: Reviewed and stable  Last Vitals:  Vitals Value Taken Time  BP 90/78 04/25/23 1410  Temp 36.1 C 04/25/23 1410  Pulse 70 04/25/23 1410  Resp 9 04/25/23 1410  SpO2 94 % 04/25/23 1410    Last Pain:  Vitals:   04/25/23 1410  PainSc: 0-No pain         Complications: No notable events documented.

## 2023-04-25 NOTE — Anesthesia Procedure Notes (Signed)
Procedure Name: Intubation Date/Time: 04/25/2023 12:48 PM  Performed by: Chelsea Aus, CRNAPre-anesthesia Checklist: Patient identified, Emergency Drugs available, Suction available and Patient being monitored Patient Re-evaluated:Patient Re-evaluated prior to induction Oxygen Delivery Method: Circle system utilized Preoxygenation: Pre-oxygenation with 100% oxygen Induction Type: IV induction Ventilation: Mask ventilation without difficulty and Oral airway inserted - appropriate to patient size Laryngoscope Size: Mac and 3 Grade View: Grade I Tube type: Oral Tube size: 8.5 mm Number of attempts: 1 Placement Confirmation: ETT inserted through vocal cords under direct vision, positive ETCO2 and breath sounds checked- equal and bilateral Secured at: 22 cm Tube secured with: Tape Dental Injury: Teeth and Oropharynx as per pre-operative assessment

## 2023-04-25 NOTE — Op Note (Addendum)
PROCEDURES: Robotic assisted bronchoscopy Augmented fluoroscopy Endobronchial ultrasound with TBNA   Indication: 63 year old lifelong never smoker with a right upper lobe mass (3 x 4 cm), right hilar adenopathy and recently found left axillary adenopathy.  Bronchoscopy for evaluation of right upper lobe mass and endobronchial ultrasound for right hilar adenopathy determination.  Preoperative Diagnosis: Right upper lobe mass, right hilar adenopathy rule out cancer Post Procedure Diagnosis: Same as above Consent: Verbal/Written: obtained  Benefits, limitations and potential complications of the procedure were discussed with the patient/family.  Complications from bronchoscopy are rare and most often minor, but if they occur they may include breathing difficulty, vocal cord spasm, hoarseness, slight fever, vomiting, dizziness, bronchospasm, infection, low blood oxygen, bleeding from biopsy site, or an allergic reaction to medications.  It is uncommon for patients to experience other more serious complications for example: Collapsed lung requiring chest tube placement, respiratory failure, heart attack and/or cardiac arrhythmia.  Patient understood the potential complications and agreed to proceed.  Surgeon: Gailen Shelter, MD Assistant/Scrub: Leonie Man, RRT Circulator: N/A Anesthesiologist/CRNA:  Modena Jansky Staveren,MD/ Meryle Ready Simpson,CRNA Cytotechnologist: Lenoard Aden Fluoroscopy technician: Cheryll Dessert, RT Representatives: Daphine Deutscher, and a Jolaine Artist, Auris [place] ([employer])  Type of Anesthesia: General endotracheal  Procedures Performed:   Robotic bronchoscopy: Procedure consists of robotic navigation comprised of electromagnetics, optical pattern recognition and robotic kinematic data - to triangulate bronchoscope location during the procedure and provide accurate positional data to biopsy a lesion. Augmented fluoroscopy with Body Vision. Endobronchial  ultrasound with TBNA.  Description of Procedure:  Robotic bronchoscopy: The patient was brought to Procedure Room 2 (Bronchoscopy Suite) in the OR area where appropriate timeout was taken with the staff after the patient was inducted under general anesthesia.  The patient was inducted under general anesthesia and intubated by the anesthesia team.  Patient was intubated with a 8.5 ET tube without difficulty.  Tube was secured at 4 cm above the carina.  A Portex adapter was placed on the ET tube flange.  Once the patient was under adequate general anesthesia the Olympus video bronchoscope was advanced and an anatomic airway tour and surveillance bronchoscopy was performed.The distal trachea appeared unremarkable. The main carina was sharp.  No secretions were seen in either right or left mainstem bronchi. The RLL, RML appeared to be free of endobronchial masses, lesions, or purulent secretions.  The right upper lobe bronchus had some minimal distortion at the orifice of the apical subsegment but no distinct mass lesions could be seen.  Likewise, the LLL/LUL appeared to be free of endobronchial masses, lesions, or purulent secretions. Once the survey bronchoscopy was completed, registration for the augmented fluoroscopy (Body Vision) was then performed with the fluoroscopic C arm.  Once this was completed, the robotic bronchoscope ET tube adapter was placed and ETT was cut to proper length and secured on the mid plane.  The [facility] robotic scope was then advanced through the ETT and registration was performed successfully.  There was good correlation between the robotic mapping and bronchoscopic mapping. With the assistance of fused navigation, the bronchoscope was advanced to the right upper lobe apical nodule/mass. The tip of the working channel sat within 23 mm of the nodule/mass.  The airway appeared distorted and occluded by extrinsic mass effect.  Positioning was confirmed with augmented fluoroscopy.  Augmented fluoroscopy via Body Vision was utilized to optimize the position most favorable for biopsies, then the robotic bronchoscope was anchored to maintain position.  At this point 2 passes were made  with brushes however the brushes would meet resistance and ROSE only showed inflammatory changes.  The brush was cut into CytoLyt preservative.  After confirmation of excellent hemostasis, an Olympus Peri View FLEX needle was utilized to sample the lesion.  ROSE was compatible with lesional cells, a total of 3 passes were obtained the remainder of the samples placed in CytoLyt preservative.  Having completed this and ensuring adequate hemostasis a total of 8 transbronchial biopsies were obtained at this point, the lesion could be seen moving during biopsies.  ROSE confirmed adequate tissue for diagnosis.  After confirmation of excellent hemostasis a targeted BAL was also obtained at this segment, which sent for cytology analysis.  For BAL sample acquisition purposes, 20 0 ml of normal saline were instilled, and approximately 6 ml were recovered/trapped and sent for analysis.   Endobronchial ultrasound: The robotic bronchoscope was then retracted all the way out and exchanged for Olympus endobronchial ultrasound (EBUS) scope.  At this point the mediastinum was examined there was no adenopathy noted on the precarinal space, subcarinal space, and left hilar areas.  On inspection of station 10R there was a 1.5 by 2 cm lymph node noted.  This lymph node had some calcifications within it.  3 passes were performed with a Cook 22-gauge Pro core EBUS needle. Material was placed in CytoLyt.  Having completed this portion of the procedure the bronchoscope was then exchanged for the Olympus video bronchoscope and the airway inspected.  Hemostasis was excellent.  At this point patient then received 9 mL of 1% lidocaine via bronchial lavage and the bronchoscope was retrieved.  The procedure was at this point terminated.   Patient was allowed to emerge from general anesthesia and was transferred to the PACU in satisfactory condition. Auscultation of the lungs showed no change from pre bronchoscopy examination.  Patient tolerated the procedure very well with no untoward effects of anesthesia noted.   Specimens Obtained:  Transbronchial Forceps Biopsy: X 8, RUL  Transbronchial Brush: X 2, RUL  Targeted BAL: 6 mL aliquot, RUL  Transbronchial Needle Aspirate: X 3, RUL  EBUS TBNA: X 3, station 10R  Fluoroscopy: Augmented fluoroscopy (Body Vision) was utilized during the course of this procedure to assure that biopsies were taken in a safe manner under fluoroscopic guidance with spot films required.  Total fluoroscopy time: 5 minutes 10 seconds, total dose 51.3 mGy.  Intraoperative images: Robotic bronchoscope at target:   Fluoroscopic image of bronchoscope at target:   Tool in lesion:   10R lymph node 1.5 x 2 cm:   Sampling of 10R lymph node with EBUS needle:   Complications:None, no pneumothorax on post film:   Estimated Blood Loss: Nil    Assessment and Plan/Additional Comments: Right upper lobe mass and right hilar adenopathy status post technically successful robotic assisted navigational bronchoscopy and endobronchial ultrasound with TBNA. Await pathology reports. Patient has appropriate pulmonary follow-up. Needs evaluation by oncology, patient still deciding whether to get care at [facility] or at [facility].   Gailen Shelter, MD Advanced Bronchoscopy PCCM [facility]    *This note was dictated using voice recognition software/Dragon.  Despite best efforts to proofread, errors can occur which can change the meaning.  Any change was purely unintentional.

## 2023-04-26 ENCOUNTER — Encounter: Payer: Self-pay | Admitting: Pulmonary Disease

## 2023-04-26 LAB — SURGICAL PATHOLOGY

## 2023-04-27 ENCOUNTER — Ambulatory Visit: Payer: Self-pay | Admitting: Physician Assistant

## 2023-04-27 LAB — CYTOLOGY - NON PAP

## 2023-04-28 ENCOUNTER — Inpatient Hospital Stay: Payer: 59 | Attending: Oncology

## 2023-04-28 ENCOUNTER — Inpatient Hospital Stay: Payer: 59 | Admitting: Oncology

## 2023-04-28 ENCOUNTER — Encounter: Payer: Self-pay | Admitting: *Deleted

## 2023-04-28 ENCOUNTER — Encounter: Payer: Self-pay | Admitting: Oncology

## 2023-04-28 VITALS — BP 124/48 | HR 65 | Temp 96.0°F | Resp 18 | Wt 273.9 lb

## 2023-04-28 DIAGNOSIS — Z7189 Other specified counseling: Secondary | ICD-10-CM | POA: Diagnosis not present

## 2023-04-28 DIAGNOSIS — C3411 Malignant neoplasm of upper lobe, right bronchus or lung: Secondary | ICD-10-CM

## 2023-04-28 LAB — COMPREHENSIVE METABOLIC PANEL
ALT: 19 U/L (ref 0–44)
AST: 16 U/L (ref 15–41)
Albumin: 3.8 g/dL (ref 3.5–5.0)
Alkaline Phosphatase: 50 U/L (ref 38–126)
Anion gap: 8 (ref 5–15)
BUN: 8 mg/dL (ref 8–23)
CO2: 25 mmol/L (ref 22–32)
Calcium: 9.3 mg/dL (ref 8.9–10.3)
Chloride: 107 mmol/L (ref 98–111)
Creatinine, Ser: 0.71 mg/dL (ref 0.44–1.00)
GFR, Estimated: >60 mL/min (ref >60)
Glucose, Bld: 103 mg/dL — ABNORMAL HIGH (ref 70–99)
Potassium: 4 mmol/L (ref 3.5–5.1)
Sodium: 140 mmol/L (ref 135–145)
Total Bilirubin: 0.8 mg/dL (ref 0.0–1.2)
Total Protein: 6.9 g/dL (ref 6.5–8.1)

## 2023-04-28 LAB — CBC WITH DIFFERENTIAL/PLATELET
Abs Immature Granulocytes: 0.02 10*3/uL (ref 0.00–0.07)
Basophils Absolute: 0.1 10*3/uL (ref 0.0–0.1)
Basophils Relative: 1 %
Eosinophils Absolute: 0.1 10*3/uL (ref 0.0–0.5)
Eosinophils Relative: 2 %
HCT: 39.3 % (ref 36.0–46.0)
Hemoglobin: 12.9 g/dL (ref 12.0–15.0)
Immature Granulocytes: 0 %
Lymphocytes Relative: 37 %
Lymphs Abs: 2.2 10*3/uL (ref 0.7–4.0)
MCH: 28.4 pg (ref 26.0–34.0)
MCHC: 32.8 g/dL (ref 30.0–36.0)
MCV: 86.6 fL (ref 80.0–100.0)
Monocytes Absolute: 0.4 10*3/uL (ref 0.1–1.0)
Monocytes Relative: 7 %
Neutro Abs: 3.2 10*3/uL (ref 1.7–7.7)
Neutrophils Relative %: 53 %
Platelets: 229 10*3/uL (ref 150–400)
RBC: 4.54 MIL/uL (ref 3.87–5.11)
RDW: 14.8 % (ref 11.5–15.5)
WBC: 6 10*3/uL (ref 4.0–10.5)
nRBC: 0 % (ref 0.0–0.2)

## 2023-04-28 NOTE — Progress Notes (Signed)
Met with patient during initial consult with Dr. Cathie Hoops. All questions answered during visit. Reviewed upcoming appts. Discussed Tempus testing to be ordered on recent lung biopsy sample. Pt declined financial assistance application at this time and will let me know after reviewing if she would like to apply. Patient brochure for Tempus given to patient. Informed that will go ahead and order Tempus since testing can take approximately 2 weeks to complete. Contact info given and instructed to call with any questions or needs. Pt verbalized understanding. Nothing further needed at this time.

## 2023-04-28 NOTE — Assessment & Plan Note (Signed)
Image results and pathology results were reviewed with patient.  Clinically Stage IV non small cell lung carcinoma, favor adenocarcinoma- confirmed by lung biopsy via bronchoscopy, with metastasis involving left axillary, spleen and hepatoduodenal lymphadenopathy.  Left axillary lymphadenopathy biopsy showed atypical epithelial cells. - she has family history of breast cancer, I will obtain left diagnostic mammogram with [place] for evaluation.  Never smoker, will obtain NGS to see if she has any actionable mutation Recommend MRI brain w wo contrast to complete staging.  Recommend systemic chemotherapy +/- Target therapy if applicable. Plan finalize treatment plan after NGS results.  Marland Kitchen

## 2023-04-28 NOTE — Assessment & Plan Note (Signed)
Discussed with patient that her condition is not curable and treatment is with palliative intent.

## 2023-04-28 NOTE — Progress Notes (Signed)
Hematology/Oncology Consult Note [phone] [phone]     REFERRING PROVIDER: Marjie Skiff, NP    CHIEF COMPLAINTS/PURPOSE OF CONSULTATION:  Right lung cancer  ASSESSMENT & PLAN:   Malignant neoplasm of upper lobe of right lung [facility]) Image results and pathology results were reviewed with patient.  Clinically Stage IV non small cell lung carcinoma, favor adenocarcinoma- confirmed by lung biopsy via bronchoscopy, with metastasis involving left axillary, spleen and hepatoduodenal lymphadenopathy.  Left axillary lymphadenopathy biopsy showed atypical epithelial cells. - she has family history of breast cancer, I will obtain left diagnostic mammogram with [place] for evaluation.  Never smoker, will obtain NGS to see if she has any actionable mutation Recommend MRI brain w wo contrast to complete staging.  Recommend systemic chemotherapy +/- Target therapy if applicable. Plan finalize treatment plan after NGS results.  .    Goals of care, counseling/discussion Discussed with patient that her condition is not curable and treatment is with palliative intent.    Orders Placed This Encounter  Procedures   MR Brain W Wo Contrast    Standing Status:   Future    Expected Date:   05/05/2023    Expiration Date:   04/27/2024    If indicated for the ordered procedure, I authorize the administration of contrast media per Radiology protocol:   Yes    What is the patient's sedation requirement?:   No Sedation    Does the patient have a pacemaker or implanted devices?:   No    Use SRS Protocol?:   No    Preferred imaging location?:   [facility] (table limit - 550lbs)   MM 3D DIAGNOSTIC MAMMOGRAM UNILATERAL LEFT BREAST    Standing Status:   Future    Expected Date:   05/05/2023    Expiration Date:   04/27/2024    Reason for Exam (SYMPTOM  OR DIAGNOSIS REQUIRED):   axillary lymphadenopathy    Preferred imaging location?:   [facility]   [place] LIMITED ULTRASOUND  INCLUDING AXILLA LEFT BREAST     Standing Status:   Future    Expected Date:   05/05/2023    Expiration Date:   04/27/2024    Reason for Exam (SYMPTOM  OR DIAGNOSIS REQUIRED):   axillary lymphadenopathy    Preferred imaging location?:   [facility]   CBC with Differential/Platelet    Standing Status:   Future    Number of Occurrences:   1    Expected Date:   04/28/2023    Expiration Date:   04/27/2024   Comprehensive metabolic panel    Standing Status:   Future    Number of Occurrences:   1    Expected Date:   04/28/2023    Expiration Date:   04/27/2024   CEA    Standing Status:   Future    Number of Occurrences:   1    Expected Date:   04/28/2023    Expiration Date:   04/27/2024   Follow up TBD All questions were answered. The patient knows to call the clinic with any problems, questions or concerns.  Rickard Patience, MD, PhD [facility] Hematology Oncology 04/28/2023    HISTORY OF PRESENTING ILLNESS:  Ann Robinson 63 y.o. female presents to establish care for right lung cancer.  I have reviewed her chart and materials related to her cancer extensively and collaborated history with the patient. Summary of oncologic history is as follows: Oncology History  Malignant neoplasm of upper lobe of  right lung (HCC)  03/21/2023 Imaging   Chest xray 1. Possible developing mass lesion in the anterior right lung. Further evaluation with chest CT recommended. 2. No other acute cardiopulmonary process.   03/22/2023 Imaging   CT chest wo contrast  1. 3.1 x 3.6 x 4.4 cm mass lesion in the right lung apex contiguous with the apical and medial pleura and tethered to the lateral pleura. Imaging features are highly concerning for primary bronchogenic neoplasm. 2. Contralateral left axillary and subpectoral lymphadenopathy. This would be somewhat unusual presentation for metastatic disease from the right apical lesion given lack of hilar or mediastinal lymphadenopathy although this possibility  cannot be excluded. Imaged portions of the left breast are unremarkable but correlation with mammographic history recommended. Synchronous lymphoproliferative disorder would also be a consideration. 3.  Aortic Atherosclerosis (ICD10-I70.0).      03/25/2023 Imaging   PET scan showed  1. Markedly hypermetabolic right apical lung mass consistent with primary bronchogenic carcinoma. 2. 12 mm right hilar node at the level of the carina with hypermetabolism consistent with metastatic disease. 3. Extensive left axillary subpectoral and subclavicular lymphadenopathy with hypermetabolism consistent with metastatic disease. 4. Two small hypermetabolic lesions in the spleen. These are not identified on the CT scan. Possible metastatic disease. 5. 9 mm hepatoduodenal lymph node with hypermetabolism. Possible metastatic lymph node. 6. Hypermetabolic right thyroid lobe lesion. Recommend thyroid ultrasound examination. 7. Aortic atherosclerosis   04/28/2023 Initial Diagnosis   Malignant neoplasm of upper lobe of right lung (HCC)  The patient, with no history of smoking, presented with a  several weeks history of progressive dyspnea. The patient described the dyspnea as severe, to the point of experiencing breathlessness after short walks within their small house. The patient denied any unintentional weight loss, cough, hemoptysis, or headaches. They have been actively trying to lose weight since retirement a year and a half ago, and have been more physically active, particularly with yard work.  The patient underwent a series of diagnostic tests, including an X-ray, CT scan, PET scan, and biopsy Left axillary lymph node biopsy was not conclusive.  Patient underwent bronchoscopy biopsy of RUL mass and pathology showed non-small cell lung cancer, favor adenocarcinoma. .  The patient has a family history of colon and breast cancer. she had a screening mammogram in August 2024, which was reported as  negative.      MEDICAL HISTORY:  Past Medical History:  Diagnosis Date   Anemia    In the past   Family history of adverse reaction to anesthesia    nephew passed away during palate surgery and it was thought to be related to anesthesia   Goiter    History of kidney stones    Migraines    Nephrolithiasis 1988   Pre-diabetes    Thyroid disease     SURGICAL HISTORY: Past Surgical History:  Procedure Laterality Date   ABDOMINAL HYSTERECTOMY     CHOLECYSTECTOMY     COLONOSCOPY WITH PROPOFOL N/A 07/20/2017   Procedure: COLONOSCOPY WITH PROPOFOL;  Surgeon: Toledo, Boykin Nearing, MD;  Location: [facility];  Service: Gastroenterology;  Laterality: N/A;   COLONOSCOPY WITH PROPOFOL N/A 09/26/2020   Procedure: COLONOSCOPY WITH PROPOFOL;  Surgeon: Midge Minium, MD;  Location: [facility];  Service: Endoscopy;  Laterality: N/A;   CYSTOSCOPY N/A 04/21/2016   Procedure: CYSTOSCOPY;  Surgeon: Artelia Laroche, MD;  Location: [facility];  Service: Gynecology;  Laterality: N/A;   ENDOBRONCHIAL ULTRASOUND Right 04/25/2023   Procedure: ENDOBRONCHIAL ULTRASOUND;  Surgeon:  Salena Saner, MD;  Location: [facility];  Service: Pulmonary;  Laterality: Right;   LITHOTRIPSY     ROBOTIC ASSISTED TOTAL HYSTERECTOMY WITH BILATERAL SALPINGO OOPHERECTOMY Bilateral 04/21/2016   Procedure: ROBOTIC ASSISTED TOTAL HYSTERECTOMY WITH BILATERAL SALPINGO OOPHORECTOMY;  Surgeon: Artelia Laroche, MD;  Location: [facility];  Service: Gynecology;  Laterality: Bilateral;   SENTINEL NODE BIOPSY N/A 04/21/2016   Procedure: SENTINEL NODE BIOPSY;  Surgeon: Artelia Laroche, MD;  Location: [facility];  Service: Gynecology;  Laterality: N/A;    SOCIAL HISTORY: Social History   Socioeconomic History   Marital status: Widowed    Spouse name: Not on file   Number of children: Not on file   Years of education: Not on file   Highest education level: Associate degree: academic program  Occupational  History   Not on file  Tobacco Use   Smoking status: Never   Smokeless tobacco: Never  Vaping Use   Vaping status: Never Used  Substance and Sexual Activity   Alcohol use: Not Currently   Drug use: No   Sexual activity: Yes  Other Topics Concern   Not on file  Social History Narrative   Not on file   Social Drivers of Health   Financial Resource Strain: Medium Risk (02/21/2023)   Overall Financial Resource Strain (CARDIA)    Difficulty of Paying Living Expenses: Somewhat hard  Food Insecurity: No Food Insecurity (02/21/2023)   Hunger Vital Sign    Worried About Running Out of Food in the Last Year: Never true    Ran Out of Food in the Last Year: Never true  Transportation Needs: No Transportation Needs (02/21/2023)   PRAPARE - Administrator, Civil Service (Medical): No    Lack of Transportation (Non-Medical): No  Physical Activity: Unknown (02/21/2023)   Exercise Vital Sign    Days of Exercise per Week: Patient declined    Minutes of Exercise per Session: Not on file  Stress: No Stress Concern Present (02/21/2023)   [employer] of Occupational Health - Occupational Stress Questionnaire    Feeling of Stress : Not at all  Social Connections: Moderately Integrated (02/21/2023)   Social Connection and Isolation Panel [NHANES]    Frequency of Communication with Friends and Family: Three times a week    Frequency of Social Gatherings with Friends and Family: Three times a week    Attends Religious Services: 1 to 4 times per year    Active Member of Clubs or Organizations: Yes    Attends Banker Meetings: More than 4 times per year    Marital Status: Widowed  Intimate Partner Violence: Not At Risk (03/16/2021)   Humiliation, Afraid, Rape, and Kick questionnaire    Fear of Current or Ex-Partner: No    Emotionally Abused: No    Physically Abused: No    Sexually Abused: No    FAMILY HISTORY: Family History  Problem Relation Age of Onset    Cancer Mother        colon and breast   Diabetes Mother    Breast cancer Mother 21   Alzheimer's disease Father    Heart disease Father    Hypertension Father    Diabetes Father    Diabetes Sister    Hypertension Sister    Diabetes Brother    Liver disease Brother    Heart disease Maternal Grandfather        MI   Stroke Paternal Grandmother    Thyroid disease Sister  Diabetes Brother    Breast cancer Maternal Aunt        late 34's early 16's   Breast cancer Cousin 40    ALLERGIES:  is allergic to sulfa antibiotics.  MEDICATIONS:  Current Outpatient Medications  Medication Sig Dispense Refill   Ascorbic Acid (VITAMIN C) 1000 MG tablet Take 282 mg by mouth daily.     benzonatate (TESSALON PERLES) 100 MG capsule Take 1 capsule (100 mg total) by mouth 3 (three) times daily as needed for cough. 30 capsule 0   Cholecalciferol (VITAMIN D) 125 MCG (5000 UT) CAPS Take 2,000 Units by mouth daily. 50 mcg     levothyroxine (SYNTHROID) 125 MCG tablet Take 125 mcg by mouth daily before breakfast.     rosuvastatin (CRESTOR) 20 MG tablet Take 1 tablet (20 mg total) by mouth daily. (Patient taking differently: Take 20 mg by mouth every other day.) 90 tablet 3   No current facility-administered medications for this visit.    Review of Systems  Constitutional:  Negative for appetite change, chills, fatigue and fever.  HENT:   Negative for hearing loss and voice change.   Eyes:  Negative for eye problems.  Respiratory:  Positive for shortness of breath. Negative for chest tightness and cough.   Cardiovascular:  Negative for chest pain.  Gastrointestinal:  Negative for abdominal distention, abdominal pain and blood in stool.  Endocrine: Negative for hot flashes.  Genitourinary:  Negative for difficulty urinating and frequency.   Musculoskeletal:  Negative for arthralgias.  Skin:  Negative for itching and rash.  Neurological:  Negative for extremity weakness.  Hematological:  Negative for  adenopathy.  Psychiatric/Behavioral:  Negative for confusion.      PHYSICAL EXAMINATION: ECOG PERFORMANCE STATUS: 1 - Symptomatic but completely ambulatory  Vitals:   04/28/23 1437  BP: (!) 124/48  Pulse: 65  Resp: 18  Temp: (!) 96 F (35.6 C)  SpO2: 100%   Filed Weights   04/28/23 1437  Weight: 273 lb 14.4 oz (124.2 kg)    Physical Exam Constitutional:      General: She is not in acute distress.    Appearance: She is not diaphoretic.  HENT:     Head: Normocephalic and atraumatic.     Nose: Nose normal.  Eyes:     General: No scleral icterus.    Pupils: Pupils are equal, round, and reactive to light.  Cardiovascular:     Rate and Rhythm: Normal rate and regular rhythm.     Heart sounds: No murmur heard. Pulmonary:     Effort: Pulmonary effort is normal. No respiratory distress.     Breath sounds: No wheezing.  Abdominal:     General: There is no distension.     Palpations: Abdomen is soft.     Tenderness: There is no abdominal tenderness.  Musculoskeletal:        General: Normal range of motion.     Cervical back: Normal range of motion and neck supple.  Skin:    General: Skin is warm and dry.     Findings: No erythema.  Neurological:     Mental Status: She is alert and oriented to person, place, and time. Mental status is at baseline.     Cranial Nerves: No cranial nerve deficit.     Motor: No abnormal muscle tone.  Psychiatric:        Mood and Affect: Mood and affect normal.      LABORATORY DATA:  I have reviewed the data  as listed    Latest Ref Rng & Units 04/28/2023    3:48 PM 03/21/2023    1:43 PM 02/22/2023    1:35 PM  CBC  WBC 4.0 - 10.5 K/uL 6.0  5.1  6.2   Hemoglobin 12.0 - 15.0 g/dL 40.9  81.1  91.4   Hematocrit 36.0 - 46.0 % 39.3  42.7  40.7   Platelets 150 - 400 K/uL 229  252  291       Latest Ref Rng & Units 04/28/2023    3:48 PM 03/21/2023    1:43 PM 02/22/2023    1:35 PM  CMP  Glucose 70 - 99 mg/dL 782  956  80   BUN 8 - 23  mg/dL 8  21  10    Creatinine 0.44 - 1.00 mg/dL 2.13  0.86  5.78   Sodium 135 - 145 mmol/L 140  133  140   Potassium 3.5 - 5.1 mmol/L 4.0  4.2  4.1   Chloride 98 - 111 mmol/L 107  99  104   CO2 22 - 32 mmol/L 25  20  24    Calcium 8.9 - 10.3 mg/dL 9.3  8.5  9.1   Total Protein 6.5 - 8.1 g/dL 6.9  5.3  6.2   Total Bilirubin 0.0 - 1.2 mg/dL 0.8  0.3  0.3   Alkaline Phos 38 - 126 U/L 50  82  76   AST 15 - 41 U/L 16  16  22    ALT 0 - 44 U/L 19  13  18       RADIOGRAPHIC STUDIES: I have personally reviewed the radiological images as listed and agreed with the findings in the report. DG Chest Port 1 View Result Date: 04/25/2023 CLINICAL DATA:  Status post bronchoscopy, right upper lobe mass EXAM: PORTABLE CHEST 1 VIEW COMPARISON:  03/21/2023, 04/21/2023 FINDINGS: Single frontal view of the chest demonstrates an unremarkable cardiac silhouette. Stable right upper lobe mass. No acute airspace disease, effusion, or pneumothorax. No acute bony abnormalities. IMPRESSION: 1. Stable right upper lobe mass. No complication after bronchoscopy. Electronically Signed   By: Sharlet Salina M.D.   On: 04/25/2023 15:50   DG C-Arm 1-60 Min-No Report Result Date: 04/25/2023 Fluoroscopy was utilized by the requesting physician.  No radiographic interpretation.   [place] CORE BIOPSY (LYMPH NODES) Result Date: 04/20/2023 INDICATION: Right upper lobe lung mass. Hypermetabolic left axillary adenopathy on PET-CT EXAM: ULTRASOUND GUIDED CORE BIOPSY OF LEFT AXILLARY ADENOPATHY MEDICATIONS: No periprocedural antibiotics were ANESTHESIA/SEDATION: Lidocaine 1% subcutaneous PROCEDURE: The procedure, risks, benefits, and alternatives were explained to the patient. Questions regarding the procedure were encouraged and answered. The patient understands and consents to the procedure. Survey ultrasound of the left axilla was performed. Adenopathy was localized corresponding to PET-CT findings. Representative 3 x 1.6 cm node was localized.  Overlying skin marked, then the operative field was prepped with Betadine/chlorhexidine in a sterile fashion, and a sterile drape was applied covering the operative field. A sterile gown and sterile gloves were used for the procedure. Local anesthesia was provided with 1% Lidocaine. Under real-time ultrasound guidance, a 17 gauge trocar needle was advanced to the margin of the adenopathy. Once needle tip position was confirmed, coaxial 18-gauge core biopsy samples were obtained, submitted to surgical pathology. The guide needle was removed. Postprocedure scans show no hemorrhage or other apparent complication. The patient tolerated the procedure well. COMPLICATIONS: None immediate. FINDINGS: Left adenopathy was localized, corresponding to PET-CT findings. Ultrasound-guided core biopsy of a representative enlarged  node. IMPRESSION: 1. Technically successful ultrasound-guided core biopsy, left axillary adenopathy. Electronically Signed   By: Corlis Leak M.D.   On: 04/20/2023 16:29   US THYROID Result Date: 04/03/2023 CLINICAL DATA:  Right thyroid nodule seen on PET scan EXAM: THYROID ULTRASOUND TECHNIQUE: Ultrasound examination of the thyroid gland and adjacent soft tissues was performed. COMPARISON:  PET-CT 03/25/2023 FINDINGS: Parenchymal Echotexture: Mildly heterogenous Isthmus: 0.2 cm Right lobe: 3.4 x 1.0 x 1.0 cm Left lobe: 2.8 x 0.9 x 1.1 cm _________________________________________________________ Estimated total number of nodules >/= 1 cm: 1 Number of spongiform nodules >/=  2 cm not described below (TR1): 0 Number of mixed cystic and solid nodules >/= 1.5 cm not described below (TR2): 0 _________________________________________________________ Nodule # 1: Location: Right; superior Maximum size: 1.0 cm; Other 2 dimensions: 0.5 x 0.8 cm Composition: solid/almost completely solid (2) Echogenicity: hypoechoic (2) Shape: not taller-than-wide (0) Margins: smooth (0) Echogenic foci: none (0) ACR TI-RADS total  points: 4. ACR TI-RADS risk category: TR4 (4-6 points). ACR TI-RADS recommendations: *Given size (>/= 1 - 1.4 cm) and appearance, a follow-up ultrasound in 1 year should be considered based on TI-RADS criteria. This nodule corresponds to the area of focal uptake seen on PET scan. _________________________________________________________ Nodule 2: 0.5 x 0.3 x 0.5 cm mixed solid cystic isoechoic right inferior thyroid nodule (TI-RADS 2) does not meet criteria for imaging surveillance or FNA. IMPRESSION: Nodule 1 (TI-RADS 4), measuring 1.0 cm, located in the inferior right thyroid lobe meets TI-RADS criteria for follow-up, however given focal uptake seen on PET scan should be further evaluated with FNA, as these lesions have a higher probability for malignancy. The above is in keeping with the ACR TI-RADS recommendations - J Am Coll Radiol 2017;14:587-595. Electronically Signed   By: Acquanetta Belling M.D.   On: 04/03/2023 08:53

## 2023-04-29 LAB — CEA: CEA: 1.3 ng/mL (ref 0.0–4.7)

## 2023-05-03 ENCOUNTER — Ambulatory Visit: Payer: 59 | Admitting: Pulmonary Disease

## 2023-05-03 ENCOUNTER — Encounter: Payer: Self-pay | Admitting: Pulmonary Disease

## 2023-05-03 VITALS — BP 108/80 | HR 66 | Temp 98.0°F | Ht 67.0 in | Wt 270.6 lb

## 2023-05-03 DIAGNOSIS — C3491 Malignant neoplasm of unspecified part of right bronchus or lung: Secondary | ICD-10-CM | POA: Diagnosis not present

## 2023-05-03 NOTE — Progress Notes (Signed)
Subjective:    Patient ID: Ann Robinson, female    DOB: 1960-10-17, 63 y.o.   MRN: [id number]  Patient Care Team: Marjie Skiff, NP as PCP - General (Nurse Practitioner) Rickard Patience, MD as Consulting Physician (Oncology) Glory Buff, RN as Oncology Nurse Navigator  Chief Complaint  Patient presents with   Follow-up    BACKGROUND/INTERVAL:63 year old lifelong never smoker comes for follow-up on the issue of a right upper lobe lung mass.  Patient underwent robotic assisted navigational bronchoscopy and endobronchial ultrasound with [facility] on 25 April 2023.  She has been by oncology.  This is a postprocedure visit.  HPI Discussed the use of AI scribe software for clinical note transcription with the patient, who gave verbal consent to proceed.  History of Present Illness   The patient, with stage four adenocarcinoma, presents for follow-up after robotic-assisted navigational bronchoscopy.  She is undergoing molecular testing to identify mutations that may respond to immunotherapy and is scheduled for an MRI on Thursday, January 30.  Her cough, which persisted for a while, has mostly resolved, and she rarely coughs now. No current problems with shortness of breath, and she is feeling good.  She has resumed walking and exercises regularly, including light weight exercises up to five pounds. She emphasizes the importance of exercise and sleep in her routine.  Regarding her diet, she tries to avoid sugar but finds it challenging while staying with her sister. She is attempting to eat healthily, similar to when she was trying to lose weight, and is interested in dietary impact on cancer. She is cautious about maintaining muscle mass during chemotherapy and is incorporating healthier foods into her diet.   She tolerated the bronchoscopic procedure without any significant issues.  She was diagnosed with adenocarcinoma.  Tempus testing is pending.  Review of Systems A 10 point review  of systems was performed and it is as noted above otherwise negative.   Patient Active Problem List   Diagnosis Date Noted   Malignant neoplasm of upper lobe of right lung (HCC) 04/28/2023   Goals of care, counseling/discussion 04/28/2023   Mass of right lung 03/26/2023   Thyroid mass 03/26/2023   Fatigue 03/21/2023   Other constipation 02/22/2023   H/O migraine 03/16/2021   Hyperlipemia, mixed 03/15/2021   Elevated hemoglobin A1c measurement 03/15/2021   History of colonic polyps    Vitamin D deficiency 02/13/2019   History of robot-assisted laparoscopic hysterectomy 04/21/2016   Polyp of colon 11/18/2014   Hypothyroidism 11/18/2014   Obesity 11/18/2014    Social History   Tobacco Use   Smoking status: Never   Smokeless tobacco: Never  Substance Use Topics   Alcohol use: Not Currently    Allergies  Allergen Reactions   Sulfa Antibiotics Rash    Current Meds  Medication Sig   Ascorbic Acid (VITAMIN C) 1000 MG tablet Take 282 mg by mouth daily.   benzonatate (TESSALON PERLES) 100 MG capsule Take 1 capsule (100 mg total) by mouth 3 (three) times daily as needed for cough.   Cholecalciferol (VITAMIN D) 125 MCG (5000 UT) CAPS Take 2,000 Units by mouth daily. 50 mcg   levothyroxine (SYNTHROID) 125 MCG tablet Take 125 mcg by mouth daily before breakfast.   rosuvastatin (CRESTOR) 20 MG tablet Take 1 tablet (20 mg total) by mouth daily. (Patient taking differently: Take 20 mg by mouth every other day.)    Immunization History  Administered Date(s) Administered   Influenza Inj Mdck Quad Pf 01/01/2019  Influenza,inj,Quad PF,6+ Mos 02/25/2015, 12/17/2015, 03/16/2021, 01/18/2023   Influenza-Unspecified 12/24/2016, 01/04/2020, 12/30/2021   Moderna Covid-19 Fall Seasonal Vaccine 75yrs & older 01/18/2023   Moderna SARS-COV2 Booster Vaccination 10/21/2020, 01/19/2023   Moderna Sars-Covid-2 Vaccination 06/25/2019, 07/26/2019   Td 02/16/2002   Tdap 10/09/2012, 03/21/2023         Objective:     BP 108/80 (BP Location: Right Arm, Patient Position: Sitting, Cuff Size: Large)   Pulse 66   Temp 98 F (36.7 C) (Oral)   Ht 5\' 7"  (1.702 m)   Wt 270 lb 9.6 oz (122.7 kg)   LMP 09/17/2013 (Approximate)   SpO2 97%   BMI 42.38 kg/m   SpO2: 97 % O2 Device: None (Room air)  GENERAL: Obese woman, no acute distress, fully ambulatory.  No conversational dyspnea. HEAD: Normocephalic, atraumatic.  EYES: Pupils equal, round, reactive to light.  No scleral icterus.  MOUTH: Dentition intact, oral mucosa moist.  No thrush. NECK: Supple. No thyromegaly. Trachea midline. No JVD.  No adenopathy. PULMONARY: Good air entry bilaterally.  No adventitious sounds. CARDIOVASCULAR: S1 and S2. Regular rate and rhythm.  No rubs, murmurs or gallops heard. ABDOMEN: Obese, otherwise benign. MUSCULOSKELETAL: No joint deformity, no clubbing, no edema.  NEUROLOGIC: No overt focal deficit, no gait disturbance, speech is fluent. SKIN: Intact,warm,dry. PSYCH: Mood and behavior normal.        Assessment & Plan:     ICD-10-CM   1. Adenocarcinoma of right lung, stage 4 (HCC)  C34.91      Discussion:    Adenocarcinoma Stage IV Follow-up for robotic-assisted navigational bronchoscopy on April 25, 2023. Diagnosed with stage IV adenocarcinoma. Molecular testing pending for potential immunotherapy. Discussed that adenocarcinomas, particularly in non-smokers, often respond to immunotherapies. Some patients manage the disease as a chronic condition with therapy. Prognosis has improved with new immunotherapies. Reports significant improvement in cough, now rare. No other respiratory symptoms. Encouraged continued physical activity to reduce inflammation and maintain muscle mass during chemotherapy. Discussed dietary modifications to support overall health during treatment, including reducing sugar intake and considering Mediterranean or macrobiotic diets. - Continue molecular testing for  mutations - Encourage regular physical activity - Advise on dietary modifications, including reducing sugar intake and considering Mediterranean or macrobiotic diets - Follow up with oncologist as needed  General Health Maintenance Discussed the importance of maintaining a healthy lifestyle, including regular exercise, a balanced diet, adequate sleep, and avoiding processed foods to support overall health during cancer treatment. - Encourage regular exercise, including light weights - Advise on maintaining a balanced diet with healthy oils and minimal processed foods - Emphasize the importance of adequate sleep  Follow-up - Follow up with oncologist as needed - Schedule appointments with other specialists as necessary - Return to pulmonology as needed.     Advised if symptoms do not improve or worsen, to please contact office for sooner follow up or seek emergency care.    I spent 30 minutes of dedicated to the care of this patient on the date of this encounter to include pre-visit review of records, face-to-face time with the patient discussing conditions above, post visit ordering of testing, clinical documentation with the electronic health record, making appropriate referrals as documented, and communicating necessary findings to members of the patients care team.     C. Danice Goltz, MD [facility]    *This note was generated using voice recognition software/Dragon and/or AI transcription program.  Despite best efforts to proofread, errors can occur which can  change the meaning. Any transcriptional errors that result from this process are unintentional and may not be fully corrected at the time of dictation.

## 2023-05-03 NOTE — Patient Instructions (Signed)
VISIT SUMMARY:  You came in today for a follow-up after your robotic-assisted navigational bronchoscopy. We discussed your ongoing treatment for stage IV adenocarcinoma, including molecular testing for potential immunotherapy. You reported significant improvement in your cough and no current issues with shortness of breath. You are feeling good and have resumed regular exercise and a healthy diet.  YOUR PLAN:  -ADENOCARCINOMA STAGE IV: Stage IV adenocarcinoma is an advanced form of lung cancer. We are continuing molecular testing to identify mutations that may respond to immunotherapy, which can help manage the disease as a chronic condition. You have shown significant improvement in your cough, and we encourage you to continue regular physical activity to reduce inflammation and maintain muscle mass during chemotherapy. We also discussed dietary modifications to support your health, including reducing sugar intake and considering diets like the Mediterranean or macrobiotic diets.  -GENERAL HEALTH MAINTENANCE: Maintaining a healthy lifestyle is crucial during cancer treatment. This includes regular exercise, a balanced diet with healthy oils and minimal processed foods, and adequate sleep. These habits support your overall health and well-being.  INSTRUCTIONS:  -  Awaiting molecular testing for mutations. - Follow up with your oncologist as needed. - Schedule appointments with other specialists as necessary. - Return to pulmonology as needed. - Your MRI is scheduled for Thursday.

## 2023-05-04 LAB — SURGICAL PATHOLOGY

## 2023-05-05 ENCOUNTER — Other Ambulatory Visit: Payer: 59

## 2023-05-05 ENCOUNTER — Ambulatory Visit
Admission: RE | Admit: 2023-05-05 | Discharge: 2023-05-05 | Disposition: A | Discharge status: Home / Self Care | Payer: 59 | Source: Ambulatory Visit | Attending: Oncology | Admitting: Oncology

## 2023-05-05 DIAGNOSIS — C3411 Malignant neoplasm of upper lobe, right bronchus or lung: Secondary | ICD-10-CM | POA: Diagnosis present

## 2023-05-05 MED ORDER — GADOBUTROL 1 MMOL/ML IV SOLN
10.0000 mL | Freq: Once | INTRAVENOUS | Status: AC | PRN
  Administered 2023-05-05: 10 mL via INTRAVENOUS

## 2023-05-09 ENCOUNTER — Other Ambulatory Visit: Payer: Self-pay | Admitting: Oncology

## 2023-05-09 ENCOUNTER — Ambulatory Visit
Admission: RE | Admit: 2023-05-09 | Discharge: 2023-05-09 | Disposition: A | Discharge status: Home / Self Care | Payer: 59 | Source: Ambulatory Visit | Attending: Oncology | Admitting: Oncology

## 2023-05-09 DIAGNOSIS — C3411 Malignant neoplasm of upper lobe, right bronchus or lung: Secondary | ICD-10-CM

## 2023-05-09 DIAGNOSIS — R590 Localized enlarged lymph nodes: Secondary | ICD-10-CM

## 2023-05-09 DIAGNOSIS — R928 Other abnormal and inconclusive findings on diagnostic imaging of breast: Secondary | ICD-10-CM

## 2023-05-09 DIAGNOSIS — Z7189 Other specified counseling: Secondary | ICD-10-CM

## 2023-05-10 ENCOUNTER — Other Ambulatory Visit: Payer: Self-pay

## 2023-05-10 DIAGNOSIS — C3411 Malignant neoplasm of upper lobe, right bronchus or lung: Secondary | ICD-10-CM

## 2023-05-11 ENCOUNTER — Encounter: Payer: Self-pay | Admitting: Surgery

## 2023-05-11 ENCOUNTER — Ambulatory Visit: Payer: 59 | Admitting: Surgery

## 2023-05-11 VITALS — BP 120/77 | HR 56 | Temp 97.7°F | Ht 67.0 in | Wt 266.2 lb

## 2023-05-11 DIAGNOSIS — C3411 Malignant neoplasm of upper lobe, right bronchus or lung: Secondary | ICD-10-CM

## 2023-05-11 DIAGNOSIS — R590 Localized enlarged lymph nodes: Secondary | ICD-10-CM | POA: Diagnosis not present

## 2023-05-11 NOTE — Patient Instructions (Signed)
 Our surgery scheduler Heron will call you within 24-48 hours to get you scheduled. If you have not heard from her after 48 hours, please call our office. Have the blue sheet available when she calls to write down important information.   Removing Underarm Lymph Nodes (Axillary Lymph Node Dissection): What to Expect  An axillary lymph node dissection is a procedure where lymph nodes are taken out from under the arm. Lymph nodes are clumps of tissue that help your body fight infections and get rid of harmful germs. They're part of your body's defense system, or immune system. The axillary lymph nodes drain fluid from the breast and surrounding areas. This fluid is called lymph. This surgery is often done when someone has cancer and to check if the cancer has spread. Your surgeon will remove lymph nodes that have cancer or could easily spread cancer. The procedure may be done at the same time as other procedures, such as removing a breast tumor (lumpectomy) or the entire breast (mastectomy). Tell a health care provider about: Any allergies you have. All medicines you take. These include vitamins, herbs, eye drops, and creams. Any problems you or family members have had with anesthesia. Any bleeding problems you have. Any surgeries you have had. Any medical conditions you have. Whether you're pregnant or may be pregnant. What are the risks? Your provider will talk with you about risks. These may include: Infection. Bleeding or blood clots. Allergic reactions to medicines. Numbness, tingling, stiffness, or weakness in the arm. Swelling in the arm (lymphedema). Damage to nearby structures or organs. What happens before the procedure When to stop eating and drinking Eat and drink only as you've been told. You may be told this: 8 hours before your procedure Stop eating most foods. Do not eat meat, fried foods, or fatty foods. Eat only light foods, such as toast or crackers. All liquids are OK  except energy drinks and alcohol. 6 hours before your procedure Stop eating. Drink only clear liquids, such as water , clear fruit juice, black coffee, plain tea, and sports drinks. Do not drink energy drinks or alcohol. 2 hours before your procedure Stop drinking all liquids. You may be allowed to take medicines with small sips of water . If you don't eat and drink as told, your procedure may be delayed or canceled. Medicines Ask about changing or stopping: Any medicines you take. Any vitamins, herbs, or supplements you take. Do not take aspirin or ibuprofen  unless you're told to. Surgery safety For your safety, you may: Need to wash your skin with a soap that kills germs. Get antibiotics. Have your procedure site marked. Have hair removed at the procedure site. General instructions Do not smoke, vape, or use nicotine or tobacco for at least 2 weeks before the procedure. If you'll be going home right after the procedure, plan to have a responsible adult: Drive you home from the hospital or clinic. You won't be allowed to drive home. Stay with you for the time you're told. What happens during the procedure An IV will be put into a vein in your hand or arm. You may be given: A sedative to help you relax. Anesthesia to keep you from feeling pain. You'll lie on your back with your arm straight out to the side. The surgeon will make a cut in your armpit next to your breast, being careful with nerves, blood vessels, and muscles. Tissue and muscle will be moved out of the way to access the lymph nodes. The affected  lymph nodes will be removed. This may total 10-40 lymph nodes. These lymph nodes will be sent for testing and studied under a microscope. A drainage tube may be placed in your breast to help drain fluid. The cut from surgery will be closed with stitches (sutures) or skin glue. A small bandage may be taped over the cut from surgery. These steps may vary. Ask what you can  expect. What happens after the procedure You'll be watched closely until you leave. This includes checking your pain level, blood pressure, heart rate, and breathing rate. You'll be given pain medicine as needed. You might need to wear a special sleeve on your arm. It helps prevent blood clots and reduces swelling. Ask when your procedure results will be ready and how to get them. You may need to call or meet with your provider to get your results. This information is not intended to replace advice given to you by your health care provider. Make sure you discuss any questions you have with your health care provider. Document Revised: 09/13/2022 Document Reviewed: 09/13/2022 Elsevier Patient Education  2024 [employer].

## 2023-05-12 ENCOUNTER — Encounter: Payer: Self-pay | Admitting: Oncology

## 2023-05-12 ENCOUNTER — Telehealth: Payer: Self-pay | Admitting: Surgery

## 2023-05-12 NOTE — H&P (View-Only) (Signed)
 Patient ID: Ann Robinson, female   DOB: 01-Jul-1960, 63 y.o.   MRN: [id number]  HPI Ann Robinson is a 63 y.o. female Clinically Stage IV non small cell lung carcinoma, favor adenocarcinoma- confirmed by lung biopsy via bronchoscopy, with metastasis involving left axillary, spleen and hepatoduodenal lymphadenopathy.  Left axillary lymphadenopathy biopsy showed atypical epithelial cells. - she has family history of breast cancer, I will obtain left diagnostic mammogram with US  for evaluation.  She is here for further axillary excisional biopsy to further correct arise her disease process.  Percutaneous biopsy did not yield appropriate results She did have a recent CT of the chest that I have personally reviewed showing evidence of significant adenopathy within the left axillary basin, small hematoma from biopsy. Right upper lobe mass  The patient, with no history of smoking, presented with a several weeks history of progressive dyspnea. The patient described the dyspnea as severe, to the point of experiencing breathlessness after short walks within their small house. The patient denied any unintentional weight loss, cough, hemoptysis, or headaches. They have been actively trying to lose weight since retirement a year and a half ago, and have been more physically active, particularly with yard work.  She endorses some mild intermittent sharp mild pain after biopsy that continues to improve. Recent nml cbc and cmp  HPI  Past Medical History:  Diagnosis Date   Anemia    In the past   Family history of adverse reaction to anesthesia    nephew passed away during palate surgery and it was thought to be related to anesthesia   Goiter    History of kidney stones    Migraines    Nephrolithiasis 1988   Pre-diabetes    Thyroid  disease     Past Surgical History:  Procedure Laterality Date   ABDOMINAL HYSTERECTOMY     CHOLECYSTECTOMY     COLONOSCOPY WITH PROPOFOL  N/A 07/20/2017   Procedure:  COLONOSCOPY WITH PROPOFOL ;  Surgeon: Toledo, Ladell POUR, MD;  Location: [facility];  Service: Gastroenterology;  Laterality: N/A;   COLONOSCOPY WITH PROPOFOL  N/A 09/26/2020   Procedure: COLONOSCOPY WITH PROPOFOL ;  Surgeon: Jinny Carmine, MD;  Location: [facility];  Service: Endoscopy;  Laterality: N/A;   CYSTOSCOPY N/A 04/21/2016   Procedure: CYSTOSCOPY;  Surgeon: Webb Isidor Constable, MD;  Location: [facility] OR[facility];  Service: Gynecology;  Laterality: N/A;   ENDOBRONCHIAL ULTRASOUND Right 04/25/2023   Procedure: ENDOBRONCHIAL ULTRASOUND;  Surgeon: Tamea Dedra LITTIE, MD;  Location: [facility] OR[facility];  Service: Pulmonary;  Laterality: Right;   LITHOTRIPSY     ROBOTIC ASSISTED TOTAL HYSTERECTOMY WITH BILATERAL SALPINGO OOPHERECTOMY Bilateral 04/21/2016   Procedure: ROBOTIC ASSISTED TOTAL HYSTERECTOMY WITH BILATERAL SALPINGO OOPHORECTOMY;  Surgeon: Webb Isidor Constable, MD;  Location: [facility] OR[facility];  Service: Gynecology;  Laterality: Bilateral;   SENTINEL NODE BIOPSY N/A 04/21/2016   Procedure: SENTINEL NODE BIOPSY;  Surgeon: Webb Isidor Constable, MD;  Location: [facility];  Service: Gynecology;  Laterality: N/A;    Family History  Problem Relation Age of Onset   Cancer Mother        colon and breast   Diabetes Mother    Breast cancer Mother 67   Alzheimer's disease Father    Heart disease Father    Hypertension Father    Diabetes Father    Diabetes Sister    Hypertension Sister    Diabetes Brother    Liver disease Brother    Heart disease Maternal Grandfather        MI  Stroke Paternal Grandmother    Thyroid  disease Sister    Diabetes Brother    Breast cancer Maternal Aunt        late 17's early 64's   Breast cancer Cousin 28    Social History Social History   Tobacco Use   Smoking status: Never   Smokeless tobacco: Never  Vaping Use   Vaping status: Never Used  Substance Use Topics   Alcohol use: Not Currently   Drug use: No    Allergies  Allergen Reactions   Sulfa  Antibiotics Rash    Current Outpatient Medications  Medication Sig Dispense Refill   Ascorbic Acid (VITAMIN C) 1000 MG tablet Take 1,000 mg by mouth every evening.     Cholecalciferol (VITAMIN D ) 125 MCG (5000 UT) CAPS Take 2,000 Units by mouth every evening. 50 mcg     levothyroxine  (SYNTHROID ) 125 MCG tablet Take 125 mcg by mouth daily before breakfast.     rosuvastatin  (CRESTOR ) 20 MG tablet Take 1 tablet (20 mg total) by mouth daily. (Patient taking differently: Take 20 mg by mouth every other day.) 90 tablet 3   No current facility-administered medications for this visit.     Review of Systems Full ROS  was asked and was negative except for the information on the HPI  Physical Exam Blood pressure 120/77, pulse (!) 56, temperature 97.7 F (36.5 C), temperature source Oral, height 5' 7 (1.702 m), weight 266 lb 3.2 oz (120.7 kg), last menstrual period 09/17/2013, SpO2 99%. CONSTITUTIONAL: NAD, BMI 42. EYES: Pupils are equal, round,  Sclera are non-icteric. EARS, NOSE, MOUTH AND THROAT: The oropharynx is clear. The oral mucosa is pink and moist. Hearing is intact to voice. LYMPH NODES:  Lymph nodes in the neck are normal. No specific axillary palpable lymph nodes, body habitis limits evaluation RESPIRATORY:  Lungs are clear. There is normal respiratory effort, with equal breath sounds bilaterally, and without pathologic use of accessory muscles. CARDIOVASCULAR: Heart is regular without murmurs, gallops, or rubs. GI: The abdomen is  soft, nontender, and nondistended. There are no palpable masses. There is no hepatosplenomegaly. There are normal bowel sounds in all quadrants. GU: Rectal deferred.   MUSCULOSKELETAL: Normal muscle strength and tone. No cyanosis or edema.   SKIN: Turgor is good and there are no pathologic skin lesions or ulcers. NEUROLOGIC: Motor and sensation is grossly normal. Cranial nerves are grossly intact. PSYCH:  Oriented to person, place and time. Affect is  normal.  Data Reviewed  I have personally reviewed the patient's imaging, laboratory findings and medical records.    Assessment/P 63 year old female with axillary lymphadenopathy consistent with metastatic disease.  She is in need for a formal excisional biopsy of the left axilla.  Due to the patient body habitus is extremely difficult to to discern a palpable node.  I have reviewed the CT and certainly it looks amenable for excisional biopsy.  Procedure discussed with the patient in detail.  The risks and the benefits and the possible complications including but not limited to: Bleeding, infection, pain, numbness and more importantly the chance of lymphedema.  She does have a BMI of 42 which is the greatest risk factor for lymphedema.  Discussed this potential complication with the patient and the sister in detail.  They both understand and are in agreement to proceed.  Please note that I spent 55 minutes in this encounter including extensive review of medical records, personally reviewing imaging studies, coordinating her care, placing orders and performing documentation.  A copy of this report was sent to the referring provider  Laneta Luna, MD FACS General Surgeon 05/12/2023, 4:04 PM

## 2023-05-12 NOTE — Progress Notes (Signed)
 Patient ID: Ann Robinson, female   DOB: 01-Jul-1960, 63 y.o.   MRN: [id number]  HPI Ann RECENDIZ is a 63 y.o. female Clinically Stage IV non small cell lung carcinoma, favor adenocarcinoma- confirmed by lung biopsy via bronchoscopy, with metastasis involving left axillary, spleen and hepatoduodenal lymphadenopathy.  Left axillary lymphadenopathy biopsy showed atypical epithelial cells. - she has family history of breast cancer, I will obtain left diagnostic mammogram with US  for evaluation.  She is here for further axillary excisional biopsy to further correct arise her disease process.  Percutaneous biopsy did not yield appropriate results She did have a recent CT of the chest that I have personally reviewed showing evidence of significant adenopathy within the left axillary basin, small hematoma from biopsy. Right upper lobe mass  The patient, with no history of smoking, presented with a several weeks history of progressive dyspnea. The patient described the dyspnea as severe, to the point of experiencing breathlessness after short walks within their small house. The patient denied any unintentional weight loss, cough, hemoptysis, or headaches. They have been actively trying to lose weight since retirement a year and a half ago, and have been more physically active, particularly with yard work.  She endorses some mild intermittent sharp mild pain after biopsy that continues to improve. Recent nml cbc and cmp  HPI  Past Medical History:  Diagnosis Date   Anemia    In the past   Family history of adverse reaction to anesthesia    nephew passed away during palate surgery and it was thought to be related to anesthesia   Goiter    History of kidney stones    Migraines    Nephrolithiasis 1988   Pre-diabetes    Thyroid  disease     Past Surgical History:  Procedure Laterality Date   ABDOMINAL HYSTERECTOMY     CHOLECYSTECTOMY     COLONOSCOPY WITH PROPOFOL  N/A 07/20/2017   Procedure:  COLONOSCOPY WITH PROPOFOL ;  Surgeon: Toledo, Ladell POUR, MD;  Location: [facility];  Service: Gastroenterology;  Laterality: N/A;   COLONOSCOPY WITH PROPOFOL  N/A 09/26/2020   Procedure: COLONOSCOPY WITH PROPOFOL ;  Surgeon: Jinny Carmine, MD;  Location: [facility];  Service: Endoscopy;  Laterality: N/A;   CYSTOSCOPY N/A 04/21/2016   Procedure: CYSTOSCOPY;  Surgeon: Webb Isidor Constable, MD;  Location: [facility] ORS;  Service: Gynecology;  Laterality: N/A;   ENDOBRONCHIAL ULTRASOUND Right 04/25/2023   Procedure: ENDOBRONCHIAL ULTRASOUND;  Surgeon: Tamea Dedra LITTIE, MD;  Location: [facility] OR[facility];  Service: Pulmonary;  Laterality: Right;   LITHOTRIPSY     ROBOTIC ASSISTED TOTAL HYSTERECTOMY WITH BILATERAL SALPINGO OOPHERECTOMY Bilateral 04/21/2016   Procedure: ROBOTIC ASSISTED TOTAL HYSTERECTOMY WITH BILATERAL SALPINGO OOPHORECTOMY;  Surgeon: Webb Isidor Constable, MD;  Location: [facility] OR[facility];  Service: Gynecology;  Laterality: Bilateral;   SENTINEL NODE BIOPSY N/A 04/21/2016   Procedure: SENTINEL NODE BIOPSY;  Surgeon: Webb Isidor Constable, MD;  Location: [facility] OR[facility];  Service: Gynecology;  Laterality: N/A;    Family History  Problem Relation Age of Onset   Cancer Mother        colon and breast   Diabetes Mother    Breast cancer Mother 67   Alzheimer's disease Father    Heart disease Father    Hypertension Father    Diabetes Father    Diabetes Sister    Hypertension Sister    Diabetes Brother    Liver disease Brother    Heart disease Maternal Grandfather        MI  Stroke Paternal Grandmother    Thyroid disease Sister    Diabetes Brother    Breast cancer Maternal Aunt        late 73's early 73's   Breast cancer Cousin 42    Social History Social History   Tobacco Use   Smoking status: Never   Smokeless tobacco: Never  Vaping Use   Vaping status: Never Used  Substance Use Topics   Alcohol use: Not Currently   Drug use: No    Allergies  Allergen Reactions   Sulfa  Antibiotics Rash    Current Outpatient Medications  Medication Sig Dispense Refill   Ascorbic Acid (VITAMIN C) 1000 MG tablet Take 1,000 mg by mouth every evening.     Cholecalciferol (VITAMIN D) 125 MCG (5000 UT) CAPS Take 2,000 Units by mouth every evening. 50 mcg     levothyroxine (SYNTHROID) 125 MCG tablet Take 125 mcg by mouth daily before breakfast.     rosuvastatin (CRESTOR) 20 MG tablet Take 1 tablet (20 mg total) by mouth daily. (Patient taking differently: Take 20 mg by mouth every other day.) 90 tablet 3   No current facility-administered medications for this visit.     Review of Systems Full ROS  was asked and was negative except for the information on the HPI  Physical Exam Blood pressure 120/77, pulse (!) 56, temperature 97.7 F (36.5 C), temperature source Oral, height 5\' 7"  (1.702 m), weight 266 lb 3.2 oz (120.7 kg), last menstrual period 09/17/2013, SpO2 99%. CONSTITUTIONAL: NAD, BMI 42. EYES: Pupils are equal, round,  Sclera are non-icteric. EARS, NOSE, MOUTH AND THROAT: The oropharynx is clear. The oral mucosa is pink and moist. Hearing is intact to voice. LYMPH NODES:  Lymph nodes in the neck are normal. No specific axillary palpable lymph nodes, body habitis limits evaluation RESPIRATORY:  Lungs are clear. There is normal respiratory effort, with equal breath sounds bilaterally, and without pathologic use of accessory muscles. CARDIOVASCULAR: Heart is regular without murmurs, gallops, or rubs. GI: The abdomen is  soft, nontender, and nondistended. There are no palpable masses. There is no hepatosplenomegaly. There are normal bowel sounds in all quadrants. GU: Rectal deferred.   MUSCULOSKELETAL: Normal muscle strength and tone. No cyanosis or edema.   SKIN: Turgor is good and there are no pathologic skin lesions or ulcers. NEUROLOGIC: Motor and sensation is grossly normal. Cranial nerves are grossly intact. PSYCH:  Oriented to person, place and time. Affect is  normal.  Data Reviewed  I have personally reviewed the patient's imaging, laboratory findings and medical records.    Assessment/P 63 year old female with axillary lymphadenopathy consistent with metastatic disease.  She is in need for a formal excisional biopsy of the left axilla.  Due to the patient body habitus is extremely difficult to to discern a palpable node.  I have reviewed the CT and certainly it looks amenable for excisional biopsy.  Procedure discussed with the patient in detail.  The risks and the benefits and the possible complications including but not limited to: Bleeding, infection, pain, numbness and more importantly the chance of lymphedema.  She does have a BMI of 42 which is the greatest risk factor for lymphedema.  Discussed this potential complication with the patient and the sister in detail.  They both understand and are in agreement to proceed.  Please note that I spent 55 minutes in this encounter including extensive review of medical records, personally reviewing imaging studies, coordinating her care, placing orders and performing documentation.  A copy of this report was sent to the referring provider  Sterling Big, MD FACS General Surgeon 05/12/2023, 4:04 PM

## 2023-05-12 NOTE — Telephone Encounter (Signed)
 Patient has been advised of Pre-Admission date/time, and Surgery date at [facility].  Surgery Date: 05/17/23 Preadmission Testing Date: 05/13/23 (phone 8a-1p)  Patient has been made aware to call [phone], between 1-3:00pm the day before surgery, to find out what time to arrive for surgery.

## 2023-05-13 ENCOUNTER — Encounter
Admission: RE | Admit: 2023-05-13 | Discharge: 2023-05-13 | Disposition: A | Discharge status: Home / Self Care | Payer: 59 | Source: Ambulatory Visit | Attending: Surgery

## 2023-05-13 ENCOUNTER — Other Ambulatory Visit: Payer: Self-pay

## 2023-05-13 ENCOUNTER — Other Ambulatory Visit: Payer: 59

## 2023-05-13 HISTORY — DX: Nontoxic multinodular goiter: E04.2

## 2023-05-13 HISTORY — DX: Vitamin D deficiency, unspecified: E55.9

## 2023-05-13 HISTORY — DX: Polyp of colon: K63.5

## 2023-05-13 HISTORY — DX: Thyrotoxicosis with diffuse goiter without thyrotoxic crisis or storm: E05.00

## 2023-05-13 HISTORY — DX: Malignant neoplasm of upper lobe, right bronchus or lung: C34.11

## 2023-05-13 HISTORY — DX: Mixed hyperlipidemia: E78.2

## 2023-05-13 HISTORY — DX: Postprocedural hypothyroidism: E89.0

## 2023-05-13 NOTE — Patient Instructions (Addendum)
 Your procedure is scheduled on: Tuesday, February 11 Report to the Registration Desk on the 1st floor of the [facility] To find out your arrival time, please call [phone] between 1PM - 3PM on: Monday, February 10 If your arrival time is 6:00 am, do not arrive before that time as the [facility] entrance doors do not open until 6:00 am.  REMEMBER: Instructions that are not followed completely may result in serious medical risk, up to and including death; or upon the discretion of your surgeon and anesthesiologist your surgery may need to be rescheduled.  Do not eat or drink after midnight the night before surgery.  No gum chewing or hard candies.  One week prior to surgery:  Stop Anti-inflammatories (NSAIDS) such as Advil , Aleve, Ibuprofen , Motrin , Naproxen, Naprosyn and Aspirin based products such as Excedrin, Goody's Powder, BC Powder. Stop ANY OVER THE COUNTER supplements until after surgery. Stop vitamins.  You may however, continue to take Tylenol  if needed for pain up until the day of surgery.  Continue taking all of your other prescription medications up until the day of surgery.  ON THE DAY OF SURGERY ONLY TAKE THESE MEDICATIONS WITH SIPS OF WATER :  levothyroxine  (SYNTHROID )   No Alcohol for 24 hours before or after surgery.  No Smoking including e-cigarettes for 24 hours before surgery.  No chewable tobacco products for at least 6 hours before surgery.  No nicotine patches on the day of surgery.  Do not use any recreational drugs for at least a week (preferably 2 weeks) before your surgery.  Please be advised that the combination of cocaine and anesthesia may have negative outcomes, up to and including death. If you test positive for cocaine, your surgery will be cancelled.  On the morning of surgery brush your teeth with toothpaste and water , you may rinse your mouth with mouthwash if you wish. Do not swallow any toothpaste or mouthwash.  Use CHG Soap as  directed on instruction sheet. If unable to pick up this soap, shower using antibacterial soap before coming to the hospital on the day of surgery.  Do not wear jewelry, make-up, hairpins, clips or nail polish.  For welded (permanent) jewelry: bracelets, anklets, waist bands, etc.  Please have this removed prior to surgery.  If it is not removed, there is a chance that hospital personnel will need to cut it off on the day of surgery.  Do not wear lotions, powders, or perfumes.   Do not shave body hair from the neck down 48 hours before surgery.  Contact lenses, hearing aids and dentures may not be worn into surgery.  Do not bring valuables to the hospital. [facility] is not responsible for any missing/lost belongings or valuables.   Notify your doctor if there is any change in your medical condition (cold, fever, infection).  Wear comfortable clothing (specific to your surgery type) to the hospital.  After surgery, you can help prevent lung complications by doing breathing exercises.  Take deep breaths and cough every 1-2 hours.   If you are being discharged the day of surgery, you will not be allowed to drive home. You will need a responsible individual to drive you home and stay with you for 24 hours after surgery.   If you are taking public transportation, you will need to have a responsible individual with you.  Please call the Pre-admissions Testing Dept. at [phone] if you have any questions about these instructions.  Surgery Visitation Policy:  Patients having  surgery or a procedure may have two visitors.  Children under the age of 71 must have an adult with them who is not the patient.  Temporary Visitor Restrictions Due to increasing cases of flu, RSV and COVID-19: Children ages 78 and under will not be able to visit patients in [facility] hospitals under most circumstances.      Preparing for Surgery with CHLORHEXIDINE  GLUCONATE (CHG) Soap  Chlorhexidine   Gluconate (CHG) Soap  o An antiseptic cleaner that kills germs and bonds with the skin to continue killing germs even after washing  o Used for showering the night before surgery and morning of surgery  Before surgery, you can play an important role by reducing the number of germs on your skin.  CHG (Chlorhexidine  gluconate) soap is an antiseptic cleanser which kills germs and bonds with the skin to continue killing germs even after washing.  Please do not use if you have an allergy to CHG or antibacterial soaps. If your skin becomes reddened/irritated stop using the CHG.  1. Shower the NIGHT BEFORE SURGERY and the MORNING OF SURGERY with CHG soap.  2. If you choose to wash your hair, wash your hair first as usual with your normal shampoo.  3. After shampooing, rinse your hair and body thoroughly to remove the shampoo.  4. Use CHG as you would any other liquid soap. You can apply CHG directly to the skin and wash gently with a scrungie or a clean washcloth.  5. Apply the CHG soap to your body only from the neck down. Do not use on open wounds or open sores. Avoid contact with your eyes, ears, mouth, and genitals (private parts). Wash face and genitals (private parts) with your normal soap.  6. Wash thoroughly, paying special attention to the area where your surgery will be performed.  7. Thoroughly rinse your body with warm water .  8. Do not shower/wash with your normal soap after using and rinsing off the CHG soap.  9. Pat yourself dry with a clean towel.  10. Wear clean pajamas to bed the night before surgery.  12. Place clean sheets on your bed the night of your first shower and do not sleep with pets.  13. Shower again with the CHG soap on the day of surgery prior to arriving at the hospital.  14. Do not apply any deodorants/lotions/powders.  15. Please wear clean clothes to the hospital.

## 2023-05-15 NOTE — Patient Instructions (Signed)
 Be Involved in Caring For Your Health:  Taking Medications When medications are taken as directed, they can greatly improve your health. But if they are not taken as prescribed, they may not work. In some cases, not taking them correctly can be harmful. To help ensure your treatment remains effective and safe, understand your medications and how to take them. Bring your medications to each visit for review by your provider.  Your lab results, notes, and after visit summary will be available on My Chart. We strongly encourage you to use this feature. If lab results are abnormal the clinic will contact you with the appropriate steps. If the clinic does not contact you assume the results are satisfactory. You can always view your results on My Chart. If you have questions regarding your health or results, please contact the clinic during office hours. You can also ask questions on My Chart.  We at [facility] are grateful that you chose [place] to provide your care. We strive to provide evidence-based and compassionate care and are always looking for feedback. If you get a survey from the clinic please complete this so we can hear your opinions.  Healthy Eating, Adult Healthy eating may help you get and keep a healthy body weight, reduce the risk of chronic disease, and live a long and productive life. It is important to follow a healthy eating pattern. Your nutritional and calorie needs should be met mainly by different nutrient-rich foods. What are tips for following this plan? Reading food labels Read labels and choose the following: Reduced or low sodium products. Juices with 100% fruit juice. Foods with low saturated fats (<3 g per serving) and high polyunsaturated and monounsaturated fats. Foods with whole grains, such as whole wheat, cracked wheat, brown rice, and wild rice. Whole grains that are fortified with folic acid. This is recommended for females who are pregnant or who want  to become pregnant. Read labels and do not eat or drink the following: Foods or drinks with added sugars. These include foods that contain brown sugar, corn sweetener, corn syrup, dextrose, fructose, glucose, high-fructose corn syrup, honey, invert sugar, lactose, malt syrup, maltose, molasses, raw sugar, sucrose, trehalose, or turbinado sugar. Limit your intake of added sugars to less than 10% of your total daily calories. Do not eat more than the following amounts of added sugar per day: 6 teaspoons (25 g) for females. 9 teaspoons (38 g) for males. Foods that contain processed or refined starches and grains. Refined grain products, such as white flour, degermed cornmeal, white bread, and white rice. Shopping Choose nutrient-rich snacks, such as vegetables, whole fruits, and nuts. Avoid high-calorie and high-sugar snacks, such as potato chips, fruit snacks, and candy. Use oil-based dressings and spreads on foods instead of solid fats such as butter, margarine, sour cream, or cream cheese. Limit pre-made sauces, mixes, and "instant" products such as flavored rice, instant noodles, and ready-made pasta. Try more plant-protein sources, such as tofu, tempeh, black beans, edamame, lentils, nuts, and seeds. Explore eating plans such as the Mediterranean diet or vegetarian diet. Try heart-healthy dips made with beans and healthy fats like hummus and guacamole. Vegetables go great with these. Cooking Use oil to saut or stir-fry foods instead of solid fats such as butter, margarine, or lard. Try baking, boiling, grilling, or broiling instead of frying. Remove the fatty part of meats before cooking. Steam vegetables in water or broth. Meal planning  At meals, imagine dividing your plate into fourths: One-half of  your plate is fruits and vegetables. One-fourth of your plate is whole grains. One-fourth of your plate is protein, especially lean meats, poultry, eggs, tofu, beans, or nuts. Include  low-fat dairy as part of your daily diet. Lifestyle Choose healthy options in all settings, including home, work, school, restaurants, or stores. Prepare your food safely: Wash your hands after handling raw meats. Where you prepare food, keep surfaces clean by regularly washing with hot, soapy water. Keep raw meats separate from ready-to-eat foods, such as fruits and vegetables. Cook seafood, meat, poultry, and eggs to the recommended temperature. Get a food thermometer. Store foods at safe temperatures. In general: Keep cold foods at 76F (4.4C) or below. Keep hot foods at 176F (60C) or above. Keep your freezer at [facility] (-17.8C) or below. Foods are not safe to eat if they have been between the temperatures of 40-176F (4.4-60C) for more than 2 hours. What foods should I eat? Fruits Aim to eat 1-2 cups of fresh, canned (in natural juice), or frozen fruits each day. One cup of fruit equals 1 small apple, 1 large banana, 8 large strawberries, 1 cup (237 g) canned fruit,  cup (82 g) dried fruit, or 1 cup (240 mL) 100% juice. Vegetables Aim to eat 2-4 cups of fresh and frozen vegetables each day, including different varieties and colors. One cup of vegetables equals 1 cup (91 g) broccoli or cauliflower florets, 2 medium carrots, 2 cups (150 g) raw, leafy greens, 1 large tomato, 1 large bell pepper, 1 large sweet potato, or 1 medium white potato. Grains Aim to eat 5-10 ounce-equivalents of whole grains each day. Examples of 1 ounce-equivalent of grains include 1 slice of bread, 1 cup (40 g) ready-to-eat cereal, 3 cups (24 g) popcorn, or  cup (93 g) cooked rice. Meats and other proteins Try to eat 5-7 ounce-equivalents of protein each day. Examples of 1 ounce-equivalent of protein include 1 egg,  oz nuts (12 almonds, 24 pistachios, or 7 walnut halves), 1/4 cup (90 g) cooked beans, 6 tablespoons (90 g) hummus or 1 tablespoon (16 g) peanut butter. A cut of meat or fish that is the size of a deck  of cards is about 3-4 ounce-equivalents (85 g). Of the protein you eat each week, try to have at least 8 sounce (227 g) of seafood. This is about 2 servings per week. This includes salmon, trout, herring, sardines, and anchovies. Dairy Aim to eat 3 cup-equivalents of fat-free or low-fat dairy each day. Examples of 1 cup-equivalent of dairy include 1 cup (240 mL) milk, 8 ounces (250 g) yogurt, 1 ounces (44 g) natural cheese, or 1 cup (240 mL) fortified soy milk. Fats and oils Aim for about 5 teaspoons (21 g) of fats and oils per day. Choose monounsaturated fats, such as canola and olive oils, mayonnaise made with olive oil or avocado oil, avocados, peanut butter, and most nuts, or polyunsaturated fats, such as sunflower, corn, and soybean oils, walnuts, pine nuts, sesame seeds, sunflower seeds, and flaxseed. Beverages Aim for 6 eight-ounce glasses of water per day. Limit coffee to 3-5 eight-ounce cups per day. Limit caffeinated beverages that have added calories, such as soda and energy drinks. If you drink alcohol: Limit how much you have to: 0-1 drink a day if you are female. 0-2 drinks a day if you are female. Know how much alcohol is in your drink. In the [place] one drink is one 12 oz bottle of beer (355 mL), one 5 oz glass of wine (  148 mL), or one 1 oz glass of hard liquor (44 mL). Seasoning and other foods Try not to add too much salt to your food. Try using herbs and spices instead of salt. Try not to add sugar to food. This information is based on [place] nutrition guidelines. To learn more, visit DisposableNylon.be. Exact amounts may vary. You may need different amounts. This information is not intended to replace advice given to you by your health care provider. Make sure you discuss any questions you have with your health care provider. Document Revised: 12/21/2021 Document Reviewed: 12/21/2021 Elsevier Patient Education  2024 [employer].

## 2023-05-17 ENCOUNTER — Other Ambulatory Visit: Payer: Self-pay

## 2023-05-17 ENCOUNTER — Encounter
Admission: RE | Disposition: A | Discharge status: Home / Self Care | Payer: Self-pay | Source: Home / Self Care | Attending: Surgery

## 2023-05-17 ENCOUNTER — Ambulatory Visit: Payer: 59 | Admitting: General Practice

## 2023-05-17 ENCOUNTER — Encounter: Payer: Self-pay | Admitting: Surgery

## 2023-05-17 ENCOUNTER — Ambulatory Visit
Admission: RE | Admit: 2023-05-17 | Discharge: 2023-05-17 | Disposition: A | Discharge status: Home / Self Care | Payer: 59 | Attending: Surgery | Admitting: Surgery

## 2023-05-17 DIAGNOSIS — Z79890 Hormone replacement therapy (postmenopausal): Secondary | ICD-10-CM | POA: Insufficient documentation

## 2023-05-17 DIAGNOSIS — R590 Localized enlarged lymph nodes: Secondary | ICD-10-CM | POA: Diagnosis not present

## 2023-05-17 DIAGNOSIS — Z803 Family history of malignant neoplasm of breast: Secondary | ICD-10-CM | POA: Insufficient documentation

## 2023-05-17 DIAGNOSIS — E039 Hypothyroidism, unspecified: Secondary | ICD-10-CM | POA: Diagnosis not present

## 2023-05-17 DIAGNOSIS — C3490 Malignant neoplasm of unspecified part of unspecified bronchus or lung: Secondary | ICD-10-CM | POA: Diagnosis present

## 2023-05-17 DIAGNOSIS — C3411 Malignant neoplasm of upper lobe, right bronchus or lung: Secondary | ICD-10-CM

## 2023-05-17 HISTORY — PX: AXILLARY LYMPH NODE DISSECTION: SHX5229

## 2023-05-17 SURGERY — LYMPHADENECTOMY, AXILLARY
Anesthesia: General | Laterality: Left

## 2023-05-17 MED ORDER — BUPIVACAINE-EPINEPHRINE 0.25% -1:200000 IJ SOLN
INTRAMUSCULAR | Status: DC | PRN
  Administered 2023-05-17: 30 mL

## 2023-05-17 MED ORDER — GABAPENTIN 300 MG PO CAPS
300.0000 mg | ORAL_CAPSULE | ORAL | Status: AC
  Administered 2023-05-17: 300 mg via ORAL

## 2023-05-17 MED ORDER — HYDROCODONE-ACETAMINOPHEN 5-325 MG PO TABS: 1.0000 | ORAL_TABLET | ORAL | 0 refills | Status: DC | PRN

## 2023-05-17 MED ORDER — OXYCODONE HCL 5 MG PO TABS
5.0000 mg | ORAL_TABLET | Freq: Once | ORAL | Status: AC | PRN
  Administered 2023-05-17: 5 mg via ORAL

## 2023-05-17 MED ORDER — ORAL CARE MOUTH RINSE: 15.0000 mL | Freq: Once | OROMUCOSAL | Status: AC

## 2023-05-17 MED ORDER — FENTANYL CITRATE (PF) 100 MCG/2ML IJ SOLN
25.0000 ug | INTRAMUSCULAR | Status: DC | PRN
Start: 2023-05-17 — End: 2023-05-17
  Administered 2023-05-17 (×4): 25 ug via INTRAVENOUS

## 2023-05-17 MED ORDER — GABAPENTIN 300 MG PO CAPS
ORAL_CAPSULE | ORAL | Status: AC
  Filled 2023-05-17: qty 1

## 2023-05-17 MED ORDER — EPHEDRINE SULFATE-NACL 50-0.9 MG/10ML-% IV SOSY
PREFILLED_SYRINGE | INTRAVENOUS | Status: DC | PRN
  Administered 2023-05-17 (×2): 10 mg via INTRAVENOUS

## 2023-05-17 MED ORDER — FENTANYL CITRATE (PF) 100 MCG/2ML IJ SOLN
INTRAMUSCULAR | Status: AC
  Filled 2023-05-17: qty 2

## 2023-05-17 MED ORDER — MIDAZOLAM HCL 2 MG/2ML IJ SOLN
INTRAMUSCULAR | Status: DC | PRN
  Administered 2023-05-17: 2 mg via INTRAVENOUS

## 2023-05-17 MED ORDER — ACETAMINOPHEN 500 MG PO TABS
1000.0000 mg | ORAL_TABLET | ORAL | Status: AC
  Administered 2023-05-17: 1000 mg via ORAL

## 2023-05-17 MED ORDER — DEXAMETHASONE SODIUM PHOSPHATE 10 MG/ML IJ SOLN
INTRAMUSCULAR | Status: DC | PRN
  Administered 2023-05-17: 10 mg via INTRAVENOUS

## 2023-05-17 MED ORDER — CHLORHEXIDINE GLUCONATE 0.12 % MT SOLN
15.0000 mL | Freq: Once | OROMUCOSAL | Status: AC
  Administered 2023-05-17: 15 mL via OROMUCOSAL

## 2023-05-17 MED ORDER — PROPOFOL 10 MG/ML IV BOLUS
INTRAVENOUS | Status: DC | PRN
  Administered 2023-05-17: 150 ug/kg/min via INTRAVENOUS
  Administered 2023-05-17: 150 mg via INTRAVENOUS

## 2023-05-17 MED ORDER — BUPIVACAINE LIPOSOME 1.3 % IJ SUSP
INTRAMUSCULAR | Status: AC
  Filled 2023-05-17: qty 20

## 2023-05-17 MED ORDER — CELECOXIB 200 MG PO CAPS
ORAL_CAPSULE | ORAL | Status: AC
  Filled 2023-05-17: qty 1

## 2023-05-17 MED ORDER — OXYCODONE HCL 5 MG PO TABS
ORAL_TABLET | ORAL | Status: AC
  Filled 2023-05-17: qty 1

## 2023-05-17 MED ORDER — CHLORHEXIDINE GLUCONATE CLOTH 2 % EX PADS: 6.0000 | MEDICATED_PAD | Freq: Once | CUTANEOUS | Status: DC

## 2023-05-17 MED ORDER — EPHEDRINE 5 MG/ML INJ
INTRAVENOUS | Status: AC
  Filled 2023-05-17: qty 5

## 2023-05-17 MED ORDER — GLYCOPYRROLATE 0.2 MG/ML IJ SOLN
INTRAMUSCULAR | Status: DC | PRN
  Administered 2023-05-17: .2 mg via INTRAVENOUS

## 2023-05-17 MED ORDER — LACTATED RINGERS IV SOLN: INTRAVENOUS | Status: DC | PRN

## 2023-05-17 MED ORDER — OXYCODONE HCL 5 MG/5ML PO SOLN: 5.0000 mg | Freq: Once | ORAL | Status: AC | PRN

## 2023-05-17 MED ORDER — ONDANSETRON HCL 4 MG/2ML IJ SOLN
INTRAMUSCULAR | Status: DC | PRN
  Administered 2023-05-17: 4 mg via INTRAVENOUS

## 2023-05-17 MED ORDER — DIPHENHYDRAMINE HCL 50 MG/ML IJ SOLN
INTRAMUSCULAR | Status: AC
  Filled 2023-05-17: qty 1

## 2023-05-17 MED ORDER — LACTATED RINGERS IV SOLN: INTRAVENOUS | Status: DC

## 2023-05-17 MED ORDER — PROPOFOL 1000 MG/100ML IV EMUL
INTRAVENOUS | Status: AC
  Filled 2023-05-17: qty 100

## 2023-05-17 MED ORDER — FENTANYL CITRATE (PF) 100 MCG/2ML IJ SOLN
INTRAMUSCULAR | Status: DC | PRN
  Administered 2023-05-17 (×2): 50 ug via INTRAVENOUS

## 2023-05-17 MED ORDER — BUPIVACAINE-EPINEPHRINE (PF) 0.25% -1:200000 IJ SOLN
INTRAMUSCULAR | Status: AC
  Filled 2023-05-17: qty 30

## 2023-05-17 MED ORDER — CHLORHEXIDINE GLUCONATE 0.12 % MT SOLN
OROMUCOSAL | Status: AC
  Filled 2023-05-17: qty 15

## 2023-05-17 MED ORDER — BUPIVACAINE LIPOSOME 1.3 % IJ SUSP
INTRAMUSCULAR | Status: DC | PRN
Start: 2023-05-17 — End: 2023-05-17
  Administered 2023-05-17: 20 mL

## 2023-05-17 MED ORDER — CELECOXIB 200 MG PO CAPS
200.0000 mg | ORAL_CAPSULE | ORAL | Status: AC
  Administered 2023-05-17: 200 mg via ORAL

## 2023-05-17 MED ORDER — CEFAZOLIN SODIUM-DEXTROSE 3-4 GM/150ML-% IV SOLN
3.0000 g | INTRAVENOUS | Status: AC
  Administered 2023-05-17: 3 g via INTRAVENOUS
  Filled 2023-05-17: qty 150

## 2023-05-17 MED ORDER — CHLORHEXIDINE GLUCONATE CLOTH 2 % EX PADS
6.0000 | MEDICATED_PAD | Freq: Once | CUTANEOUS | Status: AC
Start: 2023-05-17 — End: 2023-05-17
  Administered 2023-05-17: 6 via TOPICAL

## 2023-05-17 MED ORDER — MIDAZOLAM HCL 2 MG/2ML IJ SOLN
INTRAMUSCULAR | Status: AC
  Filled 2023-05-17: qty 2

## 2023-05-17 MED ORDER — ACETAMINOPHEN 500 MG PO TABS
ORAL_TABLET | ORAL | Status: AC
  Filled 2023-05-17: qty 2

## 2023-05-17 SURGICAL SUPPLY — 25 items
APPLIER CLIP 11 MED OPEN (CLIP) ×1 IMPLANT
BLADE SURG 15 STRL LF DISP TIS (BLADE) ×1 IMPLANT
CHLORAPREP W/TINT 26 (MISCELLANEOUS) ×1 IMPLANT
CLIP APPLIE 11 MED OPEN (CLIP) IMPLANT
DERMABOND ADVANCED .7 DNX12 (GAUZE/BANDAGES/DRESSINGS) IMPLANT
DRAPE LAPAROTOMY 77X122 PED (DRAPES) ×1 IMPLANT
DRAPE SHEET LG 3/4 BI-LAMINATE (DRAPES) IMPLANT
ELECT REM PT RETURN 9FT ADLT (ELECTROSURGICAL) ×1 IMPLANT
ELECTRODE REM PT RTRN 9FT ADLT (ELECTROSURGICAL) ×1 IMPLANT
GAUZE 4X4 16PLY ~~LOC~~+RFID DBL (SPONGE) ×1 IMPLANT
GLOVE BIO SURGEON STRL SZ7 (GLOVE) ×1 IMPLANT
GOWN STRL REUS W/ TWL LRG LVL3 (GOWN DISPOSABLE) ×2 IMPLANT
KIT TURNOVER KIT A (KITS) ×1 IMPLANT
LABEL OR SOLS (LABEL) ×1 IMPLANT
MANIFOLD NEPTUNE II (INSTRUMENTS) ×1 IMPLANT
NDL HYPO 22X1.5 SAFETY MO (MISCELLANEOUS) ×1 IMPLANT
NEEDLE HYPO 22X1.5 SAFETY MO (MISCELLANEOUS) ×1 IMPLANT
NS IRRIG 500ML POUR BTL (IV SOLUTION) ×1 IMPLANT
PACK BASIN MINOR ARMC (MISCELLANEOUS) ×1 IMPLANT
SUT MNCRL AB 4-0 PS2 18 (SUTURE) ×1 IMPLANT
SUT VIC AB 2-0 CT1 TAPERPNT 27 (SUTURE) ×1 IMPLANT
SUT VIC AB 3-0 54X BRD REEL (SUTURE) ×1 IMPLANT
SYR CONTROL 10ML LL (SYRINGE) ×1 IMPLANT
TRAP FLUID SMOKE EVACUATOR (MISCELLANEOUS) ×1 IMPLANT
WATER STERILE IRR 500ML POUR (IV SOLUTION) ×1 IMPLANT

## 2023-05-17 NOTE — Interval H&P Note (Signed)
History and Physical Interval Note:  05/17/2023 7:20 AM  Vesta Mixer  has presented today for surgery, with the diagnosis of lymphadenopathy.  The various methods of treatment have been discussed with the patient and family. After consideration of risks, benefits and other options for treatment, the patient has consented to  Procedure(s): AXILLARY LYMPH NODE DISSECTION, RNFA to assist (Left) as a surgical intervention.  The patient's history has been reviewed, patient examined, no change in status, stable for surgery.  I have reviewed the patient's chart and labs.  Questions were answered to the patient's satisfaction.     Trevion Hoben F Gjon Letarte

## 2023-05-17 NOTE — Anesthesia Postprocedure Evaluation (Signed)
Anesthesia Post Note  Patient: ARIEANNA PRESSEY  Procedure(s) Performed: AXILLARY LYMPH NODE DISSECTION, RNFA to assist (Left)  Patient location during evaluation: PACU Anesthesia Type: General Level of consciousness: awake and alert Pain management: pain level controlled Vital Signs Assessment: post-procedure vital signs reviewed and stable Respiratory status: spontaneous breathing, nonlabored ventilation, respiratory function stable and patient connected to nasal cannula oxygen Cardiovascular status: blood pressure returned to baseline and stable Postop Assessment: no apparent nausea or vomiting Anesthetic complications: no  There were no known notable events for this encounter.   Last Vitals:  Vitals:   05/17/23 0902 05/17/23 0920  BP: (!) 144/70 (!) 144/72  Pulse: 61 65  Resp: 15 18  Temp: (!) 36.1 C (!) 36.2 C  SpO2: 100% 100%    Last Pain:  Vitals:   05/17/23 0920  TempSrc: Temporal  PainSc: 4                  Stephanie Coup

## 2023-05-17 NOTE — Anesthesia Preprocedure Evaluation (Signed)
Anesthesia Evaluation  Patient identified by MRN, date of birth, ID band Patient awake    Reviewed: Allergy & Precautions, NPO status , Patient's Chart, lab work & pertinent test results  History of Anesthesia Complications (+) Family history of anesthesia reaction  Airway Mallampati: I  TM Distance: >3 FB Neck ROM: full    Dental  (+) Teeth Intact, Dental Advidsory Given   Pulmonary neg pulmonary ROS   Pulmonary exam normal breath sounds clear to auscultation       Cardiovascular Exercise Tolerance: Good negative cardio ROS Normal cardiovascular exam Rhythm:Regular Rate:Normal     Neuro/Psych  Headaches negative neurological ROS  negative psych ROS   GI/Hepatic negative GI ROS, Neg liver ROS,,,  Endo/Other  Hypothyroidism    Renal/GU      Musculoskeletal   Abdominal   Peds  Hematology negative hematology ROS (+) Blood dyscrasia, anemia   Anesthesia Other Findings Past Medical History: No date: Anemia     Comment:  In the past No date: Family history of adverse reaction to anesthesia     Comment:  nephew passed away during palate surgery and it was               thought to be related to anesthesia No date: Goiter No date: History of kidney stones No date: Migraines 1988: Nephrolithiasis No date: Pre-diabetes No date: Thyroid disease  Past Surgical History: No date: ABDOMINAL HYSTERECTOMY No date: CHOLECYSTECTOMY 07/20/2017: COLONOSCOPY WITH PROPOFOL; N/A     Comment:  Procedure: COLONOSCOPY WITH PROPOFOL;  Surgeon: [place],               Boykin Nearing, MD;  Location: [facility];  Service:               Gastroenterology;  Laterality: N/A; 09/26/2020: COLONOSCOPY WITH PROPOFOL; N/A     Comment:  Procedure: COLONOSCOPY WITH PROPOFOL;  Surgeon: Midge Minium, MD;  Location: [facility];  Service:               Endoscopy;  Laterality: N/A; 04/21/2016: CYSTOSCOPY; N/A     Comment:   Procedure: CYSTOSCOPY;  Surgeon: Artelia Laroche,              MD;  Location: [facility];  Service: Gynecology;                Laterality: N/A; No date: LITHOTRIPSY 04/21/2016: ROBOTIC ASSISTED TOTAL HYSTERECTOMY WITH BILATERAL  SALPINGO OOPHERECTOMY; Bilateral     Comment:  Procedure: ROBOTIC ASSISTED TOTAL HYSTERECTOMY WITH               BILATERAL SALPINGO OOPHORECTOMY;  Surgeon: Artelia Laroche, MD;  Location: [facility];  Service:               Gynecology;  Laterality: Bilateral; 04/21/2016: SENTINEL NODE BIOPSY; N/A     Comment:  Procedure: SENTINEL NODE BIOPSY;  Surgeon: Artelia Laroche, MD;  Location: [facility];  Service:               Gynecology;  Laterality: N/A;     Reproductive/Obstetrics negative OB ROS  Anesthesia Physical Anesthesia Plan  ASA: 3  Anesthesia Plan: General   Post-op Pain Management:    Induction: Intravenous  PONV Risk Score and Plan: 3 and Ondansetron, Dexamethasone and Midazolam  Airway Management Planned: LMA  Additional Equipment:   Intra-op Plan:   Post-operative Plan: Extubation in OR  Informed Consent: I have reviewed the patients History and Physical, chart, labs and discussed the procedure including the risks, benefits and alternatives for the proposed anesthesia with the patient or authorized representative who has indicated his/her understanding and acceptance.     Dental Advisory Given  Plan Discussed with: Anesthesiologist, CRNA and Surgeon  Anesthesia Plan Comments: (Patient consented for risks of anesthesia including but not limited to:  - adverse reactions to medications - damage to eyes, teeth, lips or other oral mucosa - nerve damage due to positioning  - sore throat or hoarseness - Damage to heart, brain, nerves, lungs, other parts of body or loss of life  Patient voiced understanding and assent.)       Anesthesia  Quick Evaluation

## 2023-05-17 NOTE — Op Note (Signed)
Pre-operative Diagnosis: metastatic CA and left axillary adenopathy   Post-operative Diagnosis: Same   Surgeon: Sterling Big,  MD FACS  Anesthesia: GETA  Procedure: Left Deep axillary lymphadenectomy, excisional biopsy for two nodes   Findings: Two large Left axillary abnormal and enlarged nodes  Estimated Blood Loss: Minimal         Drains: None         Specimens: Axillary nodes x 2        Complications: none                 Condition: Stable   Procedure Details  The patient was seen again in the Holding Room. The benefits, complications, treatment options, and expected outcomes were discussed with the patient. The risks of bleeding, infection, recurrence of symptoms, failure to resolve symptoms, lymphedema, hematoma, seroma, open wound, cosmetic deformity, and the need for further surgery were discussed.  The patient was taken to Operating Room, identified as Ann Robinson and the procedure verified.  A Time Out was held and the above information confirmed.  Prior to the induction of general anesthesia, antibiotic prophylaxis was administered. VTE prophylaxis was in place. Appropriate anesthesia was then administered and tolerated well. The axillar and chest wall were prepped with Chloraprep and draped in the sterile fashion. The patient was positioned in the supine position.   Left Axillary incision created with 15 blade, electrocautery used to dissect the subcutaneous tissue/ Fascia incised and the axillary basin was entered. There were two enlarged and palpable nodes were seen. Using clip applier the pedicle and its lymphatic channels were clipped. THe specimen were sent. Excellent hemostasis observed .  Liposomal  Marcaine was infiltrated into the skin and subcutaneous tissues of the cavity. Once assuring that hemostasis was adequate and checked multiple times the wound was closed with interrupted 3-0 Vicryl followed by 4-0 subcuticular Monocryl sutures.  Dermabond was  used to coat the skin.  Patient was taken to the recovery room in stable condition   Sterling Big, MD, FACS

## 2023-05-17 NOTE — Discharge Instructions (Addendum)
Removing Underarm Lymph Nodes (Axillary Lymph Node Dissection): What to Know After After an axillary lymph node dissection, it's common to have some pain and soreness. You may also have: Trouble moving your arm or shoulder. A small amount of swelling in your arm. Numbness on the upper and inside parts of your arm. Follow these instructions at home: Medicines Take your medicines only as told. Take your antibiotics as told. Do not stop taking them even if you start to feel better. You may need to take steps to help treat or prevent trouble pooping (constipation), such as: Taking medicines to help you poop. Eating foods high in fiber, like beans, whole grains, and fresh fruits and vegetables. Drinking more fluids as told. Ask your provider if it's safe to drive or use machines while taking your medicine. Incision care  Take care of your cut as told. Make sure you: Wash your hands with soap and water for at least 20 seconds before and after you change your bandage. If you can't use soap and water, use hand sanitizer. Change your bandage. Leave stitches or skin glue alone. Leave tape strips alone unless you're told to take them off. You may trim the edges of the tape strips if they curl up. Check the area around your cut every day for signs of infection. Check for: Redness, more swelling, or more pain. Fluid or blood. Warmth. Pus or a bad smell. If you have drainage tubes, follow your health care provider's instructions about how to care for them. Activity Ask if it's OK for you to lift. Do arm and shoulder exercises as told by your provider. This may prevent movement problems and stiffness. Ask what things are safe for you to do at home. Ask when you can go back to work or school. Bathing If your provider says you can take baths or showers, cover the bandage with a watertight covering to protect it from water. Do not let the bandage get wet. Keep the bandage dry until your provider says  it can be taken off. General instructions Do not smoke, vape, or use nicotine or tobacco. Wear a compression garment on your arm as told by your provider. This may help to prevent blood clots and lessen swelling in your arm. Do not have your blood pressure taken, have blood drawn, or get injections or IVs on the side where your lymph nodes were removed. Get screened for extra fluid around your lymph nodes (lymphedema) as often as told. Keep all follow-up visits. Your provider will check for changes or problems that could affect healing. Your provider may give you more instructions. Make sure you know what you can and can't do. Contact a health care provider if: You have any signs of infection around your cut from surgery. You have pus or a bad smell coming from your cut from surgery. Your arm is swollen, tight, and painful. Get help right away if: You have a fever or chills. You have very bad pain that doesn't get better with medicine. Your cut from surgery starts to open or come apart. Your drain comes out or gets blocked. This information is not intended to replace advice given to you by your health care provider. Make sure you discuss any questions you have with your health care provider. Document Revised: 09/13/2022 Document Reviewed: 09/13/2022 Elsevier Patient Education  2024 [employer]   Removing Underarm Lymph Nodes (Axillary Lymph Node Dissection): What to Expect  An axillary lymph node dissection is a procedure where lymph  nodes are taken out from under the arm. Lymph nodes are clumps of tissue that help your body fight infections and get rid of harmful germs. They're part of your body's defense system, or immune system. The axillary lymph nodes drain fluid from the breast and surrounding areas. This fluid is called lymph. This surgery is often done when someone has cancer and to check if the cancer has spread. Your surgeon will remove lymph nodes that have cancer or could  easily spread cancer. The procedure may be done at the same time as other procedures, such as removing a breast tumor (lumpectomy) or the entire breast (mastectomy). Tell a health care provider about: Any allergies you have. All medicines you take. These include vitamins, herbs, eye drops, and creams. Any problems you or family members have had with anesthesia. Any bleeding problems you have. Any surgeries you have had. Any medical conditions you have. Whether you're pregnant or may be pregnant. What are the risks? Your provider will talk with you about risks. These may include: Infection. Bleeding or blood clots. Allergic reactions to medicines. Numbness, tingling, stiffness, or weakness in the arm. Swelling in the arm (lymphedema). Damage to nearby structures or organs. What happens before the procedure When to stop eating and drinking Eat and drink only as you've been told. You may be told this: 8 hours before your procedure Stop eating most foods. Do not eat meat, fried foods, or fatty foods. Eat only light foods, such as toast or crackers. All liquids are OK except energy drinks and alcohol. 6 hours before your procedure Stop eating. Drink only clear liquids, such as water, clear fruit juice, black coffee, plain tea, and sports drinks. Do not drink energy drinks or alcohol. 2 hours before your procedure Stop drinking all liquids. You may be allowed to take medicines with small sips of water. If you don't eat and drink as told, your procedure may be delayed or canceled. Medicines Ask about changing or stopping: Any medicines you take. Any vitamins, herbs, or supplements you take. Do not take aspirin or ibuprofen unless you're told to. Surgery safety For your safety, you may: Need to wash your skin with a soap that kills germs. Get antibiotics. Have your procedure site marked. Have hair removed at the procedure site. General instructions Do not smoke, vape, or use  nicotine or tobacco for at least 2 weeks before the procedure. If you'll be going home right after the procedure, plan to have a responsible adult: Drive you home from the hospital or clinic. You won't be allowed to drive home. Stay with you for the time you're told. What happens during the procedure An IV will be put into a vein in your hand or arm. You may be given: A sedative to help you relax. Anesthesia to keep you from feeling pain. You'll lie on your back with your arm straight out to the side. The surgeon will make a cut in your armpit next to your breast, being careful with nerves, blood vessels, and muscles. Tissue and muscle will be moved out of the way to access the lymph nodes. The affected lymph nodes will be removed. This may total 10-40 lymph nodes. These lymph nodes will be sent for testing and studied under a microscope. A drainage tube may be placed in your breast to help drain fluid. The cut from surgery will be closed with stitches (sutures) or skin glue. A small bandage may be taped over the cut from surgery. These steps may  vary. Ask what you can expect. What happens after the procedure You'll be watched closely until you leave. This includes checking your pain level, blood pressure, heart rate, and breathing rate. You'll be given pain medicine as needed. You might need to wear a special sleeve on your arm. It helps prevent blood clots and reduces swelling. Ask when your procedure results will be ready and how to get them. You may need to call or meet with your provider to get your results. This information is not intended to replace advice given to you by your health care provider. Make sure you discuss any questions you have with your health care provider. Document Revised: 09/13/2022 Document Reviewed: 09/13/2022 Elsevier Patient Education  2024 [employer].

## 2023-05-17 NOTE — Transfer of Care (Signed)
Immediate Anesthesia Transfer of Care Note  Patient: Ann Robinson  Procedure(s) Performed: AXILLARY LYMPH NODE DISSECTION, RNFA to assist (Left)  Patient Location: PACU  Anesthesia Type:General  Level of Consciousness: awake  Airway & Oxygen Therapy: Patient Spontanous Breathing  Post-op Assessment: Report given to RN and Post -op Vital signs reviewed and stable  Post vital signs: Reviewed and stable  Last Vitals:  Vitals Value Taken Time  BP 127/76 05/17/23 0832  Temp    Pulse 81 05/17/23 0835  Resp 14 05/17/23 0835  SpO2 95 % 05/17/23 0835  Vitals shown include unfiled device data.  Last Pain:  Vitals:   05/17/23 0616  TempSrc: Oral  PainSc: 0-No pain         Complications: There were no known notable events for this encounter.

## 2023-05-17 NOTE — Anesthesia Procedure Notes (Signed)
Procedure Name: LMA Insertion Date/Time: 05/17/2023 7:41 AM  Performed by: Lysbeth Penner, CRNAPre-anesthesia Checklist: Patient identified, Emergency Drugs available, Suction available, Patient being monitored and Timeout performed Patient Re-evaluated:Patient Re-evaluated prior to induction Oxygen Delivery Method: Circle system utilized Preoxygenation: Pre-oxygenation with 100% oxygen Induction Type: IV induction LMA: LMA inserted LMA Size: 3.0 Number of attempts: 1 Tube secured with: Tape Dental Injury: Teeth and Oropharynx as per pre-operative assessment

## 2023-05-18 ENCOUNTER — Encounter: Payer: Self-pay | Admitting: Surgery

## 2023-05-18 LAB — SURGICAL PATHOLOGY

## 2023-05-20 ENCOUNTER — Encounter: Payer: Self-pay | Admitting: Nurse Practitioner

## 2023-05-20 ENCOUNTER — Ambulatory Visit (INDEPENDENT_AMBULATORY_CARE_PROVIDER_SITE_OTHER): Payer: 59 | Admitting: Nurse Practitioner

## 2023-05-20 VITALS — BP 105/68 | HR 62 | Temp 97.7°F | Ht 67.0 in | Wt 273.8 lb

## 2023-05-20 DIAGNOSIS — E66813 Obesity, class 3: Secondary | ICD-10-CM | POA: Diagnosis not present

## 2023-05-20 DIAGNOSIS — R530 Neoplastic (malignant) related fatigue: Secondary | ICD-10-CM | POA: Diagnosis not present

## 2023-05-20 DIAGNOSIS — E079 Disorder of thyroid, unspecified: Secondary | ICD-10-CM | POA: Diagnosis not present

## 2023-05-20 DIAGNOSIS — C3411 Malignant neoplasm of upper lobe, right bronchus or lung: Secondary | ICD-10-CM

## 2023-05-20 DIAGNOSIS — Z6841 Body Mass Index (BMI) 40.0 and over, adult: Secondary | ICD-10-CM

## 2023-05-20 DIAGNOSIS — L03818 Cellulitis of other sites: Secondary | ICD-10-CM

## 2023-05-20 MED ORDER — DOXYCYCLINE HYCLATE 100 MG PO TABS: 100.0000 mg | ORAL_TABLET | Freq: Two times a day (BID) | ORAL | 0 refills | Status: AC

## 2023-05-20 NOTE — Progress Notes (Signed)
 BP 105/68   Pulse 62   Temp 97.7 F (36.5 C) (Oral)   Ht 5\' 7"  (1.702 m)   Wt 273 lb 12.8 oz (124.2 kg)   LMP 09/17/2013 (Approximate)   SpO2 99%   BMI 42.88 kg/m    Subjective:    Patient ID: Ann Robinson, female    DOB: 1961-03-22, 63 y.o.   MRN: [id number]  HPI: Ann Robinson is a 63 y.o. female  Chief Complaint  Patient presents with   Cancer   LUNG MASS Follow-up today for lung mass.  Was noted to have a lung mass right apex + lymphadenopathy -- both on CT and PET scan in December 2024. Last visit to oncology was 04/28/23, with upcoming return to discuss all results and treatment. Had visit with Dr. Jayme Cloud on 05/03/23, they performed bronchoscopy - biopsy did note non-small cell carcinoma. Left axillary lymph node dissection and sampling on 05/17/23, she would like writer to look at area today due to concerns for infection.  She is still considering going to [facility] oncology for second opinion, as her children would like her to do this. On PET she was also noted to have thyroid nodule, this was biopsied by Dr. Tedd Sias with endo on 04/07/23 with reassuring findings.  Eating better and has gained 3 lbs since last visit. Reports fatigue and dizziness have improved.  Shortness of breath improved.  Has moved in with her sister in [place] to assist.  Duration:  weeks Severity: moderate  Onset: sudden Context when symptoms started:  unknown Symptoms improve with rest: no  Depressive symptoms: no Stress/anxiety: no Insomnia: no Snoring: no Observed apnea by bed partner: no Daytime hypersomnolence:no Wakes feeling refreshed: yes History of sleep study: no Dysnea on exertion:  no Orthopnea/PND: no Chest pain: no Chronic cough: no Lower extremity edema: no Arthralgias:no Myalgias: no Weakness:no Rash: no     05/20/2023    2:10 PM 03/21/2023    1:02 PM 02/22/2023    1:07 PM 03/17/2022    4:15 PM 03/16/2021    4:31 PM  Depression screen PHQ 2/9  Decreased  Interest 0 0 0 0 0  Down, Depressed, Hopeless 0 0 0 0 0  PHQ - 2 Score 0 0 0 0 0  Altered sleeping 0 0 0 0 0  Tired, decreased energy 0 0 0 0 0  Change in appetite 0 0 0 0 0  Feeling bad or failure about yourself  0 0 0 0 0  Trouble concentrating 0 0 0 0 0  Moving slowly or fidgety/restless 0 0 0 0 0  Suicidal thoughts 0 0 0 0 0  PHQ-9 Score 0 0 0 0 0  Difficult doing work/chores Not difficult at all Not difficult at all Not difficult at all Not difficult at all Not difficult at all       05/20/2023    2:10 PM 03/21/2023    1:02 PM 02/22/2023    1:08 PM 03/17/2022    4:15 PM  GAD 7 : Generalized Anxiety Score  Nervous, Anxious, on Edge 0 0 0 0  Control/stop worrying 0 0 0 0  Worry too much - different things 0 0 0 0  Trouble relaxing 0 0 0 0  Restless 0 0 0 0  Easily annoyed or irritable 0 0 0 0  Afraid - awful might happen 0 0 0 0  Total GAD 7 Score 0 0 0 0  Anxiety Difficulty Not difficult at all Not  difficult at all Not difficult at all    Relevant past medical, surgical, family and social history reviewed and updated as indicated. Interim medical history since our last visit reviewed. Allergies and medications reviewed and updated.  Review of Systems  Constitutional:  Negative for activity change, appetite change, diaphoresis, fatigue and fever.  Respiratory:  Negative for cough, chest tightness, shortness of breath and wheezing.   Cardiovascular:  Negative for chest pain, palpitations and leg swelling.  Gastrointestinal: Negative.   Neurological:  Negative for dizziness, syncope, weakness, light-headedness, numbness and headaches.  Psychiatric/Behavioral: Negative.      Per HPI unless specifically indicated above     Objective:    BP 105/68   Pulse 62   Temp 97.7 F (36.5 C) (Oral)   Ht 5\' 7"  (1.702 m)   Wt 273 lb 12.8 oz (124.2 kg)   LMP 09/17/2013 (Approximate)   SpO2 99%   BMI 42.88 kg/m   Wt Readings from Last 3 Encounters:  05/20/23 273 lb 12.8 oz  (124.2 kg)  05/17/23 270 lb (122.5 kg)  05/13/23 270 lb (122.5 kg)    Physical Exam Vitals and nursing note reviewed.  Constitutional:      General: She is awake. She is not in acute distress.    Appearance: She is well-developed and well-groomed. She is obese. She is not ill-appearing or toxic-appearing.  HENT:     Head: Normocephalic.     Right Ear: Hearing and external ear normal.     Left Ear: Hearing and external ear normal.  Eyes:     General: Lids are normal.        Right eye: No discharge.        Left eye: No discharge.     Conjunctiva/sclera: Conjunctivae normal.     Pupils: Pupils are equal, round, and reactive to light.  Neck:     Thyroid: No thyromegaly.     Vascular: No carotid bruit.  Cardiovascular:     Rate and Rhythm: Normal rate and regular rhythm.     Heart sounds: Normal heart sounds. No murmur heard.    No gallop.  Pulmonary:     Effort: Pulmonary effort is normal. No accessory muscle usage or respiratory distress.     Breath sounds: Normal breath sounds.  Abdominal:     General: Bowel sounds are normal. There is no distension.     Palpations: Abdomen is soft.     Tenderness: There is no abdominal tenderness.  Musculoskeletal:     Cervical back: Normal range of motion and neck supple.     Right lower leg: No edema.     Left lower leg: No edema.  Lymphadenopathy:     Cervical: No cervical adenopathy.  Skin:    General: Skin is warm and dry.     Findings: Wound present.       Neurological:     Mental Status: She is alert and oriented to person, place, and time.     Deep Tendon Reflexes: Reflexes are normal and symmetric.     Reflex Scores:      Brachioradialis reflexes are 2+ on the right side and 2+ on the left side.      Patellar reflexes are 2+ on the right side and 2+ on the left side. Psychiatric:        Attention and Perception: Attention normal.        Mood and Affect: Mood normal.        Speech: Speech normal.  Behavior: Behavior  normal. Behavior is cooperative.        Thought Content: Thought content normal.       Assessment & Plan:   Problem List Items Addressed This Visit       Respiratory   Malignant neoplasm of upper lobe of right lung (HCC) - Primary (Chronic)   Diagnosed in January 2025.  Continue collaboration with oncology.  Recent notes and labs reviewed.        Relevant Medications   doxycycline (VIBRA-TABS) 100 MG tablet     Other   Fatigue   Improved at this time.  Continue to monitor, suspect some related to cancer.      Obesity   BMI 42.88.  Recommended eating smaller high protein, low fat meals more frequently and exercising 30 mins a day 5 times a week with a goal of 10-15lb weight loss in the next 3 months. Patient voiced their understanding and motivation to adhere to these recommendations.       Thyroid mass   Biopsy by endo was reassuring, will continue to collaborate with Dr. Tedd Sias.      Other Visit Diagnoses       Cellulitis of other specified site       Under left axilla, surgical site, will treat with Doxycyline and if no improvement she is to alert provider.         Follow up plan: Return in about 6 months (around 11/17/2023) for LUNG MASS.

## 2023-05-21 NOTE — Assessment & Plan Note (Signed)
 Biopsy by endo was reassuring, will continue to collaborate with Dr. Tedd Sias.

## 2023-05-21 NOTE — Assessment & Plan Note (Signed)
BMI 42.88.  Recommended eating smaller high protein, low fat meals more frequently and exercising 30 mins a day 5 times a week with a goal of 10-15lb weight loss in the next 3 months. Patient voiced their understanding and motivation to adhere to these recommendations.

## 2023-05-21 NOTE — Assessment & Plan Note (Signed)
 Diagnosed in January 2025.  Continue collaboration with oncology.  Recent notes and labs reviewed.

## 2023-05-21 NOTE — Assessment & Plan Note (Signed)
 Improved at this time.  Continue to monitor, suspect some related to cancer.

## 2023-05-24 ENCOUNTER — Inpatient Hospital Stay: Payer: 59 | Attending: Oncology | Admitting: Oncology

## 2023-05-24 ENCOUNTER — Encounter: Payer: Self-pay | Admitting: *Deleted

## 2023-05-24 ENCOUNTER — Encounter: Payer: Self-pay | Admitting: Oncology

## 2023-05-24 VITALS — BP 118/63 | HR 70 | Temp 97.6°F | Resp 18 | Wt 270.9 lb

## 2023-05-24 DIAGNOSIS — G9389 Other specified disorders of brain: Secondary | ICD-10-CM | POA: Insufficient documentation

## 2023-05-24 DIAGNOSIS — G939 Disorder of brain, unspecified: Secondary | ICD-10-CM | POA: Diagnosis not present

## 2023-05-24 DIAGNOSIS — C3411 Malignant neoplasm of upper lobe, right bronchus or lung: Secondary | ICD-10-CM

## 2023-05-24 NOTE — Assessment & Plan Note (Addendum)
 Non small cell lung carcinoma, favor adenocarcinoma- confirmed by lung biopsy via bronchoscopy, 10R lymph node has activity on PET scan, biopsy was negative.  There is questionable activity in spleen and hepatoduodenal lymphadenopathy-discussed on tumor board. Left axillary lymphadenopathy needle biopsy showed atypical epithelial cells. - Excisional lymph node biopsy is negative for malignancy.  Clinically she may have stage I non-small cell lung cancer versus stage IV if spleen lesion and hepatoduodenal lymphadenopathy activity are counted as metastasis.  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 It might be helpful to repeat the PET scan to determine if the spleen and hepatoduodenal lesions continue to show hypermetabolic activity.  She would likely benefit from either adjuvant EGFR TKIs or palliative TKI's +/- chemotherapy if stage IV disease is confirmed. Patient plans to get second opinion with [facility] oncology.  She has an appointment on 05/26/2023.  Patient states that she is going to move her care over to [facility] due to her plan of moving to [place] to live with her sister.

## 2023-05-24 NOTE — Assessment & Plan Note (Addendum)
 MRI brain showed 8 mm mass in the anterior interhemispheric fissure, favor left parafalcine meningioma-need short-term follow-up -I will defer next imaging to [facility] oncology given that she is transferring care there.

## 2023-05-24 NOTE — Progress Notes (Signed)
 Hematology/Oncology Consult Note [phone] [phone]     REFERRING PROVIDER: Marjie Skiff, NP    CHIEF COMPLAINTS/PURPOSE OF CONSULTATION:  Right lung cancer  ASSESSMENT & PLAN:   Malignant neoplasm of upper lobe of right lung (HCC) Non small cell lung carcinoma, favor adenocarcinoma- confirmed by lung biopsy via bronchoscopy, 10R lymph node has activity on PET scan, biopsy was negative.  There is questionable activity in spleen and hepatoduodenal lymphadenopathy-discussed on tumor board. Left axillary lymphadenopathy needle biopsy showed atypical epithelial cells. - Excisional lymph node biopsy is negative for malignancy.  Clinically she may have stage I non-small cell lung cancer versus stage IV if spleen lesion and hepatoduodenal lymphadenopathy activity are counted as metastasis.  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 It might be helpful to repeat the PET scan to determine if the spleen and hepatoduodenal lesions continue to show hypermetabolic activity.  She would likely benefit from either adjuvant EGFR TKIs or palliative TKI's +/- chemotherapy if stage IV disease is confirmed. Patient plans to get second opinion with [facility] oncology.  She has an appointment on 05/26/2023.  Patient states that she is going to move her care over to [facility] due to her plan of moving to [place] to live with her sister.   Brain lesion MRI brain showed 8 mm mass in the anterior interhemispheric fissure, favor left parafalcine meningioma-need short-term follow-up -I will defer next imaging to [facility] oncology given that she is transferring care there.  No follow-up appointment is scheduled due to patient's prior to transfer care to [facility] oncology.  She will call back if she needs to reestablish. All questions were answered. The patient knows to call the clinic with any problems,  questions or concerns.  Rickard Patience, MD, PhD [facility] Hematology Oncology 05/24/2023    HISTORY OF PRESENTING ILLNESS:  Ann Robinson 63 y.o. female presents to establish care for right lung cancer.  I have reviewed her chart and materials related to her cancer extensively and collaborated history with the patient. Summary of oncologic history is as follows: Oncology History  Malignant neoplasm of upper lobe of right lung (HCC)  03/21/2023 Imaging   Chest xray 1. Possible developing mass lesion in the anterior right lung. Further evaluation with chest CT recommended. 2. No other acute cardiopulmonary process.   03/22/2023 Imaging   CT chest wo contrast  1. 3.1 x 3.6 x 4.4 cm mass lesion in the right lung apex contiguous with the apical and medial pleura and tethered to the lateral pleura. Imaging features are highly concerning for primary bronchogenic neoplasm. 2. Contralateral left axillary and subpectoral lymphadenopathy. This would be somewhat unusual presentation for metastatic disease from the right apical lesion given lack of hilar or mediastinal lymphadenopathy although this possibility cannot be excluded. Imaged portions of the left breast are unremarkable but correlation with mammographic history recommended. Synchronous lymphoproliferative disorder would also be a consideration. 3.  Aortic Atherosclerosis (ICD10-I70.0).      03/25/2023 Imaging   PET scan showed  1. Markedly hypermetabolic right apical lung mass consistent with primary bronchogenic carcinoma. 2. 12 mm right hilar node at the level of the carina with hypermetabolism consistent with metastatic disease. 3. Extensive left axillary subpectoral and subclavicular lymphadenopathy with hypermetabolism consistent with metastatic disease. 4. Two small hypermetabolic lesions in the spleen. These are not identified on the CT scan. Possible metastatic disease. 5. 9 mm hepatoduodenal lymph node with  hypermetabolism. Possible metastatic lymph node. 6. Hypermetabolic right thyroid lobe lesion. Recommend thyroid ultrasound examination. 7. Aortic atherosclerosis   04/20/2023 Procedure   Left axilla lymph node needle biopsy showed minute fragments of atypical epithelial cells.  Atypical lymphoid infiltrate with associated epithelioid granulomatous response       IT IS NOTED THAT THE PATIENT HAS A HISTORY OF A 4.4 CM MASS IN THE RIGHT LUNG      AND NOW WITH CONTRALATERAL LEFT AXILLARY LYMPHADENOPATHY.  THERE IS ALSO A      HISTORY OF A THYROID NODULE AND MULTIPLE SPLENIC LESIONS      THE SPECIMEN CONSISTS OF MULTIPLE FRAGMENTED CORES OF PREDOMINANTLY LYMPHOID      TISSUE.  1 CORE HAS A FOCAL SEPARATE FRAGMENTS OF BLOOD WITH A CLUSTER OF      ATYPICAL EPITHELIOID CELLS.  A CLUSTER OF EPITHELIOID CELLS ASSOCIATED WITH      LYMPHOID TISSUE IS CONCERNING FOR A METASTATIC CARCINOMA; HOWEVER, THIS IS      SLIGHTLY SEPARATE AND NOT INTIMATELY ASSOCIATED WITH THE CORE OF LYMPHOID TISSUE      AND A CONTAMINANT IS ENTERTAINED IS A POSSIBILITY.  IN ADDITION A FURTHER WORKUP      (CK7/TTF-1/THYROGLOBULIN/PAX8) WAS ATTEMPTED IN THIS MINUTE FRAGMENT WAS LOST ON      LEVELS.      IN ADDITION, THE LYMPHOID TISSUE ITSELF SHOWS AN OVERALL ATYPICAL ARCHITECTURE      PREDOMINANTLY SECONDARY TO ABUNDANT HISTIOCYTES WHICH COALESCE TO FORM AREAS OF      EPITHELIOID GRANULOMAS.  THE OVERALL LYMPHOID IMMUNO ARCHITECTURE SHOWS NODULES      OF CD20 POSITIVE B CELLS WHICH ARE APPROPRIATELY POSITIVE FOR      PAX5/CD20/CD10/BCL6 AND APPROPRIATELY NEGATIVE FOR BCL-2.  CD23 HEIGHTS OF      FOLLICULAR DENDRITIC CELL MESHWORK.  THERE IS A PROMINENT INTERFOLLICULAR ZONE      OF CD3/CD5/CD43 POSITIVE T CELLS.  CD30 HIGHLIGHTS A FEW REACTIVE IMMUNOBLASTS      BUT NO HODGKIN/REED-STERNBERG CELLS ARE IDENTIFIED.  EBV ISH IS NEGATIVE.  CD68      HIGHLIGHTS THE ABUNDANT HISTIOCYTOSIS.  MUM1 HIGHLIGHTS BACKGROUND PLASMA CELLS       KAPPA/LAMBDA ISH IS ESSENTIALLY NONCONTRIBUTORY DUE TO LIMITED STAINING.  AN      ADDITIONAL KERATIN STAIN (CK AE1/AE3) IS NEGATIVE.  WHILE THE OVERALL      MORPHOLOGIC ARCHITECTURE AND PRESENCE OF EPITHELIOID HISTIOCYTOSIS IS SLIGHTLY      ATYPICAL THE OVERALL IMMUNOHISTOCHEMICAL STAINS FAVOR A REACTIVE PROCESS.      THE OVERALL FINDINGS, INCLUDING THE ATYPICAL EPITHELIAL CLUSTER, EPITHELIOID      GRANULOMATOSIS AND MORPHOLOGICALLY ATYPICAL LYMPHOID ARCHITECTURE, ARE OVERALL      NONSPECIFIC, BUT THE EPITHELIAL CLUSTER IS LEFT UNEXPLAINED AND GIVEN THE      CLINICAL HISTORY REPEAT SAMPLING IS RECOMMENDED TO FULLY EXCLUDE METASTASIS.  IN      ADDITION IF THE LYMPH NODE IS CLINICALLY CONCERNING FOR LYMPHOMA A EXCISIONAL      BIOPSY MAY BE HELPFUL.    04/25/2023 Cancer Staging   Staging form: Lung, AJCC V9 - Clinical stage from 04/25/2023: cT2, cN0, cM0 - Signed by Rickard Patience, MD on 05/18/2023 Stage prefix: Initial diagnosis Method of lymph node assessment: Clinical   04/28/2023 Initial Diagnosis   Malignant neoplasm of upper lobe of right lung [facility])  The patient, with no history of smoking, presented with a  several weeks history of progressive dyspnea. The patient described the dyspnea as severe, to the point of experiencing  breathlessness after short walks within their small house. The patient denied any unintentional weight loss, cough, hemoptysis, or headaches. They have been actively trying to lose weight since retirement a year and a half ago, and have been more physically active, particularly with yard work.  The patient underwent a series of diagnostic tests, including an X-ray, CT scan, PET scan, and biopsy Left axillary lymph node biopsy was not conclusive.  Patient underwent bronchoscopy biopsy of RUL mass and pathology showed non-small cell lung cancer, favor adenocarcinoma. .  The patient has a family history of colon and breast cancer. she had a screening mammogram in August 2024,  which was reported as negative.    05/05/2023 Imaging   MRI brain with and without contrast 8 mm mass in the anterior interhemispheric fissure, favor left parafalcine meningioma but recommend short follow-up (such as in 3 months) to exclude solitary metastasis.   05/09/2023 Imaging   Left breast diagnostic mammogram with ultrasound showed 1. Persistent LEFT axillary lymphadenopathy. 2. No mammographic evidence of malignancy in the LEFT breast   05/17/2023 Procedure   Excisional left axilla lymph node biopsy showed benign reactive lymph node.  Negative for lymphoproliferative disorder or metastatic carcinoma.    Patient presents to discuss results and management plan.  No new concerns.  MEDICAL HISTORY:  Past Medical History:  Diagnosis Date   Adenocarcinoma of right lung, stage 4 (HCC) 04/2023   Anemia    In the past   Colon polyps    Complex atypical endometrial hyperplasia 03/2016   Family history of adverse reaction to anesthesia    nephew passed away during palate surgery and it was thought to be related to anesthesia   Goiter    Graves disease    History of kidney stones    Hypothyroidism, postablative    radio-iodine 1998 and 2011   LAD (lymphadenopathy), axillary 04/2023   Malignant neoplasm of upper lobe of right lung (HCC)    Migraines    Mixed hyperlipidemia    Multinodular goiter    Pre-diabetes    Vitamin D deficiency     SURGICAL HISTORY: Past Surgical History:  Procedure Laterality Date   AXILLARY LYMPH NODE DISSECTION Left 05/17/2023   Procedure: AXILLARY LYMPH NODE DISSECTION, RNFA to assist;  Surgeon: Leafy Ro, MD;  Location: [facility] ORS;  Service: General;  Laterality: Left;   CHOLECYSTECTOMY  2000   COLONOSCOPY     2010, 2015   COLONOSCOPY WITH PROPOFOL N/A 07/20/2017   Procedure: COLONOSCOPY WITH PROPOFOL;  Surgeon: Toledo, Boykin Nearing, MD;  Location: [facility] END[facility]COPY;  Service: Gastroenterology;  Laterality: N/A;   COLONOSCOPY WITH PROPOFOL N/A  09/26/2020   Procedure: COLONOSCOPY WITH PROPOFOL;  Surgeon: Midge Minium, MD;  Location: [facility];  Service: Endoscopy;  Laterality: N/A;   CYSTOSCOPY N/A 04/21/2016   Procedure: CYSTOSCOPY;  Surgeon: Artelia Laroche, MD;  Location: [facility] ORS;  Service: Gynecology;  Laterality: N/A;   ENDOBRONCHIAL ULTRASOUND Right 04/25/2023   Procedure: ENDOBRONCHIAL ULTRASOUND;  Surgeon: Salena Saner, MD;  Location: [facility] ORS;  Service: Pulmonary;  Laterality: Right;   LITHOTRIPSY     ROBOTIC ASSISTED TOTAL HYSTERECTOMY WITH BILATERAL SALPINGO OOPHERECTOMY Bilateral 04/21/2016   Procedure: ROBOTIC ASSISTED TOTAL HYSTERECTOMY WITH BILATERAL SALPINGO OOPHORECTOMY;  Surgeon: Artelia Laroche, MD;  Location: [facility] ORS;  Service: Gynecology;  Laterality: Bilateral;   SENTINEL NODE BIOPSY N/A 04/21/2016   Procedure: SENTINEL NODE BIOPSY;  Surgeon: Artelia Laroche, MD;  Location: [facility] OR[facility];  Service: Gynecology;  Laterality: N/A;    SOCIAL HISTORY: Social History   Socioeconomic History   Marital status: Widowed    Spouse name: Not on file   Number of children: 2   Years of education: Not on file   Highest education level: Associate degree: academic program  Occupational History   Not on file  Tobacco Use   Smoking status: Never   Smokeless tobacco: Never  Vaping Use   Vaping status: Never Used  Substance and Sexual Activity   Alcohol use: Not Currently   Drug use: No   Sexual activity: Yes  Other Topics Concern   Not on file  Social History Narrative   Lives alone   Social Drivers of Health   Financial Resource Strain: Medium Risk (02/21/2023)   Overall Financial Resource Strain (CARDIA)    Difficulty of Paying Living Expenses: Somewhat hard  Food Insecurity: No Food Insecurity (02/21/2023)   Hunger Vital Sign    Worried About Running Out of Food in the Last Year: Never true    Ran Out of Food in the Last Year: Never true  Transportation Needs: No  Transportation Needs (02/21/2023)   PRAPARE - Administrator, Civil Service (Medical): No    Lack of Transportation (Non-Medical): No  Physical Activity: Unknown (02/21/2023)   Exercise Vital Sign    Days of Exercise per Week: Patient declined    Minutes of Exercise per Session: Not on file  Stress: No Stress Concern Present (02/21/2023)   [employer] of Occupational Health - Occupational Stress Questionnaire    Feeling of Stress : Not at all  Social Connections: Moderately Integrated (02/21/2023)   Social Connection and Isolation Panel [NHANES]    Frequency of Communication with Friends and Family: Three times a week    Frequency of Social Gatherings with Friends and Family: Three times a week    Attends Religious Services: 1 to 4 times per year    Active Member of Clubs or Organizations: Yes    Attends Banker Meetings: More than 4 times per year    Marital Status: Widowed  Intimate Partner Violence: Not At Risk (03/16/2021)   Humiliation, Afraid, Rape, and Kick questionnaire    Fear of Current or Ex-Partner: No    Emotionally Abused: No    Physically Abused: No    Sexually Abused: No    FAMILY HISTORY: Family History  Problem Relation Age of Onset   Cancer Mother        colon and breast   Diabetes Mother    Breast cancer Mother 38   Alzheimer's disease Father    Heart disease Father    Hypertension Father    Diabetes Father    Diabetes Sister    Hypertension Sister    Diabetes Brother    Liver disease Brother    Heart disease Maternal Grandfather        MI   Stroke Paternal Grandmother    Thyroid disease Sister    Diabetes Brother    Breast cancer Maternal Aunt        late 43's early 53's   Breast cancer Cousin 40    ALLERGIES:  is allergic to sulfa antibiotics.  MEDICATIONS:  Current Outpatient Medications  Medication Sig Dispense Refill   Ascorbic Acid (VITAMIN C) 1000 MG tablet Take 1,000 mg by mouth every evening.      Cholecalciferol (VITAMIN D) 125 MCG (5000 UT) CAPS Take 2,000 Units by mouth every evening. 50 mcg  doxycycline (VIBRA-TABS) 100 MG tablet Take 1 tablet (100 mg total) by mouth 2 (two) times daily for 7 days. 14 tablet 0   levothyroxine (SYNTHROID) 125 MCG tablet Take 125 mcg by mouth daily before breakfast.     rosuvastatin (CRESTOR) 20 MG tablet Take 1 tablet (20 mg total) by mouth daily. (Patient taking differently: Take 20 mg by mouth every other day.) 90 tablet 3   No current facility-administered medications for this visit.    Review of Systems  Constitutional:  Negative for appetite change, chills, fatigue and fever.  HENT:   Negative for hearing loss and voice change.   Eyes:  Negative for eye problems.  Respiratory:  Positive for shortness of breath. Negative for chest tightness and cough.   Cardiovascular:  Negative for chest pain.  Gastrointestinal:  Negative for abdominal distention, abdominal pain and blood in stool.  Endocrine: Negative for hot flashes.  Genitourinary:  Negative for difficulty urinating and frequency.   Musculoskeletal:  Negative for arthralgias.  Skin:  Negative for itching and rash.  Neurological:  Negative for extremity weakness.  Hematological:  Negative for adenopathy.  Psychiatric/Behavioral:  Negative for confusion.      PHYSICAL EXAMINATION: ECOG PERFORMANCE STATUS: 1 - Symptomatic but completely ambulatory  Vitals:   05/24/23 1109  BP: 118/63  Pulse: 70  Resp: 18  Temp: 97.6 F (36.4 C)   Filed Weights   05/24/23 1109  Weight: 270 lb 14.4 oz (122.9 kg)    Physical Exam Constitutional:      General: She is not in acute distress.    Appearance: She is not diaphoretic.  HENT:     Head: Normocephalic and atraumatic.     Nose: Nose normal.  Eyes:     General: No scleral icterus. Cardiovascular:     Rate and Rhythm: Normal rate.  Pulmonary:     Effort: Pulmonary effort is normal. No respiratory distress.  Abdominal:      General: There is no distension.  Musculoskeletal:        General: Normal range of motion.     Cervical back: Normal range of motion and neck supple.  Skin:    Findings: No erythema.  Neurological:     Mental Status: She is alert and oriented to person, place, and time. Mental status is at baseline.     Motor: No abnormal muscle tone.  Psychiatric:        Mood and Affect: Mood and affect normal.      LABORATORY DATA:  I have reviewed the data as listed    Latest Ref Rng & Units 04/28/2023    3:48 PM 03/21/2023    1:43 PM 02/22/2023    1:35 PM  CBC  WBC 4.0 - 10.5 K/uL 6.0  5.1  6.2   Hemoglobin 12.0 - 15.0 g/dL 64.4  03.4  74.2   Hematocrit 36.0 - 46.0 % 39.3  42.7  40.7   Platelets 150 - 400 K/uL 229  252  291       Latest Ref Rng & Units 04/28/2023    3:48 PM 03/21/2023    1:43 PM 02/22/2023    1:35 PM  CMP  Glucose 70 - 99 mg/dL 595  638  80   BUN 8 - 23 mg/dL 8  21  10    Creatinine 0.44 - 1.00 mg/dL 7.56  4.33  2.95   Sodium 135 - 145 mmol/L 140  133  140   Potassium 3.5 - 5.1 mmol/L 4.0  4.2  4.1   Chloride 98 - 111 mmol/L 107  99  104   CO2 22 - 32 mmol/L 25  20  24    Calcium 8.9 - 10.3 mg/dL 9.3  8.5  9.1   Total Protein 6.5 - 8.1 g/dL 6.9  5.3  6.2   Total Bilirubin 0.0 - 1.2 mg/dL 0.8  0.3  0.3   Alkaline Phos 38 - 126 U/L 50  82  76   AST 15 - 41 U/L 16  16  22    ALT 0 - 44 U/L 19  13  18       RADIOGRAPHIC STUDIES: I have personally reviewed the radiological images as listed and agreed with the findings in the report. MR Brain W Wo Contrast Result Date: 05/16/2023 CLINICAL DATA:  Lung cancer staging EXAM: MRI HEAD WITHOUT AND WITH CONTRAST TECHNIQUE: Multiplanar, multiecho pulse sequences of the brain and surrounding structures were obtained without and with intravenous contrast. CONTRAST:  10mL GADAVIST GADOBUTROL 1 MMOL/ML IV SOLN COMPARISON:  03/25/2023 PET-CT.  Head CT 03/21/2023 FINDINGS: Brain: Enhancing mass in the anterior left para median inter  hemispheric fissure measuring 8 mm, likely dural, but relationship to the cortex is difficult to discern given small size. Small FLAIR hyperintensities in the cerebral white matter. Brain volume is normal. No infarct, hydrocephalus, or collection. No contributory change since recent head CT. Vascular: Major flow voids and vascular enhancements are preserved. Skull and upper cervical spine: Normal marrow signal. Sinuses/Orbits: Negative IMPRESSION: 8 mm mass in the anterior interhemispheric fissure, favor left parafalcine meningioma but recommend short follow-up (such as in 3 months) to exclude solitary metastasis. Electronically Signed   By: Tiburcio Pea M.D.   On: 05/16/2023 11:09   [place] AXILLA LEFT Result Date: 05/11/2023 CLINICAL DATA:  63 year old female with recently diagnosed RIGHT lung adenocarcinoma. On staging PET-CT, she was noted to have FDG avid LEFT axillary lymphadenopathy. Ultrasound-guided LEFT axillary lymph node biopsy was performed on April 20, 2023, with pathology demonstrating atypical epithelial cells. She presents for diagnostic mammogram to assess for possible breast primary. EXAM: DIGITAL DIAGNOSTIC UNILATERAL LEFT MAMMOGRAM WITH TOMOSYNTHESIS AND CAD TECHNIQUE: Left digital diagnostic mammography and breast tomosynthesis was performed. The images were evaluated with computer-aided detection. COMPARISON:  Previous exam(s). ACR Breast Density Category b: There are scattered areas of fibroglandular density. FINDINGS: No suspicious mass, calcification, or other findings are identified in the left breast. Targeted left axillary ultrasound was performed. This demonstrates multiple enlarged lymph nodes, with cortical thickness of up to 6 mm. IMPRESSION: 1. Persistent LEFT axillary lymphadenopathy. 2. No mammographic evidence of malignancy in the LEFT breast. RECOMMENDATION: Recommend repeat LEFT axillary lymph node biopsy with samples sent to cytology and flow cytometry. I have discussed the  findings and recommendations with the patient. The biopsy procedure was discussed with the patient and questions were answered. Patient expressed their understanding of the biopsy recommendation. Patient will be scheduled for biopsy at her earliest convenience by the schedulers. Ordering provider will be notified. If applicable, a reminder letter will be sent to the patient regarding the next appointment. BI-RADS CATEGORY  4: Suspicious. Electronically Signed   By: Jacob Moores M.D.   On: 05/11/2023 08:03   MM 3D DIAGNOSTIC MAMMOGRAM UNILATERAL LEFT BREAST Result Date: 05/09/2023 CLINICAL DATA:  63 year old female with recently diagnosed RIGHT lung adenocarcinoma. On staging PET-CT, she was noted to have FDG avid LEFT axillary lymphadenopathy. Ultrasound-guided LEFT axillary lymph node biopsy was performed on April 20, 2023, with pathology demonstrating  atypical epithelial cells. She presents for diagnostic mammogram to assess for possible breast primary. EXAM: DIGITAL DIAGNOSTIC UNILATERAL LEFT MAMMOGRAM WITH TOMOSYNTHESIS AND CAD TECHNIQUE: Left digital diagnostic mammography and breast tomosynthesis was performed. The images were evaluated with computer-aided detection. COMPARISON:  Previous exam(s). ACR Breast Density Category b: There are scattered areas of fibroglandular density. FINDINGS: No suspicious mass, calcification, or other findings are identified in the left breast. Targeted left axillary ultrasound was performed. This demonstrates multiple enlarged lymph nodes, with cortical thickness of up to 6 mm. IMPRESSION: 1. Persistent LEFT axillary lymphadenopathy. 2. No mammographic evidence of malignancy in the LEFT breast. RECOMMENDATION: Recommend repeat LEFT axillary lymph node biopsy with samples sent to cytology and flow cytometry. I have discussed the findings and recommendations with the patient. The biopsy procedure was discussed with the patient and questions were answered. Patient  expressed their understanding of the biopsy recommendation. Patient will be scheduled for biopsy at her earliest convenience by the schedulers. Ordering provider will be notified. If applicable, a reminder letter will be sent to the patient regarding the next appointment. BI-RADS CATEGORY  4: Suspicious. Electronically Signed   By: Jacob Moores M.D.   On: 05/09/2023 11:29   DG Chest Port 1 View Result Date: 04/25/2023 CLINICAL DATA:  Status post bronchoscopy, right upper lobe mass EXAM: PORTABLE CHEST 1 VIEW COMPARISON:  03/21/2023, 04/21/2023 FINDINGS: Single frontal view of the chest demonstrates an unremarkable cardiac silhouette. Stable right upper lobe mass. No acute airspace disease, effusion, or pneumothorax. No acute bony abnormalities. IMPRESSION: 1. Stable right upper lobe mass. No complication after bronchoscopy. Electronically Signed   By: Sharlet Salina M.D.   On: 04/25/2023 15:50   DG C-Arm 1-60 Min-No Report Result Date: 04/25/2023 Fluoroscopy was utilized by the requesting physician.  No radiographic interpretation.

## 2023-05-24 NOTE — Progress Notes (Signed)
 Pt here for follow up. No new concerns voiced.

## 2023-05-24 NOTE — Progress Notes (Signed)
 Nothing further needed at this time. Pt has scheduled appt with [facility] on 2/20 to transfer care due to moving closer to sister. Will close to navigation services.

## 2023-05-25 ENCOUNTER — Inpatient Hospital Stay: Payer: 59

## 2023-06-01 ENCOUNTER — Inpatient Hospital Stay (HOSPITAL_BASED_OUTPATIENT_CLINIC_OR_DEPARTMENT_OTHER): Payer: 59 | Admitting: Hospice and Palliative Medicine

## 2023-06-01 DIAGNOSIS — C3411 Malignant neoplasm of upper lobe, right bronchus or lung: Secondary | ICD-10-CM

## 2023-06-01 NOTE — Progress Notes (Signed)
 Multidisciplinary Oncology Council Documentation  Ann Robinson was presented by our [facility] on 06/01/2023, which included representatives from:  Palliative Care Dietitian  Physical/Occupational Therapist Nurse Navigator Genetics Social work Survivorship RN Financial Navigator Research RN   Ann Robinson currently presents with history of lung cancer  We reviewed previous medical and familial history, history of present illness, and recent lab results along with all available histopathologic and imaging studies. The MOC considered available treatment options and made the following recommendations/referrals:  Genetics  The MOC is a meeting of clinicians from various specialty areas who evaluate and discuss patients for whom a multidisciplinary approach is being considered. Final determinations in the plan of care are those of the provider(s).   Today's extended care, comprehensive team conference, Ann Robinson was not present for the discussion and was not examined.

## 2023-06-02 ENCOUNTER — Telehealth: Payer: Self-pay

## 2023-06-02 NOTE — Telephone Encounter (Signed)
 Clinical Social Work was referred by [facility] for assessment of psychosocial needs.  CSW attempted to contact patient by phone.  Left voicemail with contact information and request for return call.

## 2023-06-06 ENCOUNTER — Encounter: Payer: Self-pay | Admitting: Surgery

## 2023-06-06 ENCOUNTER — Ambulatory Visit (INDEPENDENT_AMBULATORY_CARE_PROVIDER_SITE_OTHER): Payer: 59 | Admitting: Surgery

## 2023-06-06 VITALS — BP 112/68 | HR 71 | Temp 98.1°F | Ht 67.0 in | Wt 270.0 lb

## 2023-06-06 DIAGNOSIS — R590 Localized enlarged lymph nodes: Secondary | ICD-10-CM

## 2023-06-06 DIAGNOSIS — C3411 Malignant neoplasm of upper lobe, right bronchus or lung: Secondary | ICD-10-CM

## 2023-06-06 DIAGNOSIS — Z09 Encounter for follow-up examination after completed treatment for conditions other than malignant neoplasm: Secondary | ICD-10-CM

## 2023-06-06 NOTE — Patient Instructions (Signed)
   Follow-up with our office as needed.  Please call and ask to speak with a nurse if you develop questions or concerns.

## 2023-06-06 NOTE — Progress Notes (Signed)
 Ann Robinson is 3 weeks out from left axillary excisional biopsy.  No evidence of cancer within the tumor.  She has some discomfort.  No fevers no chills no evidence of infection.  No evidence of bleeding.  PE NAD Wound healing Well.  Arm without lymphedema.  No infection  A/P Doing well w/o complications RTC prn

## 2023-06-14 DIAGNOSIS — C3490 Malignant neoplasm of unspecified part of unspecified bronchus or lung: Secondary | ICD-10-CM | POA: Insufficient documentation

## 2023-06-22 ENCOUNTER — Encounter: Payer: Self-pay | Admitting: Surgery

## 2023-09-11 ENCOUNTER — Encounter: Payer: Self-pay | Admitting: Nurse Practitioner

## 2023-11-07 ENCOUNTER — Encounter: Payer: Self-pay | Admitting: Nurse Practitioner

## 2023-11-07 MED ORDER — ROSUVASTATIN CALCIUM 10 MG PO TABS: 10.0000 mg | ORAL_TABLET | Freq: Every day | ORAL | 3 refills | Status: AC

## 2023-11-19 NOTE — Patient Instructions (Incomplete)
 Doppler venous ultrasound for left leg to check blood flow due to discomfort and bruising + knot or regular ultrasound to check on area of knot -- ?if cyst or bite.  Be Involved in Caring For Your Health:  Taking Medications When medications are taken as directed, they can greatly improve your health. But if they are not taken as prescribed, they may not work. In some cases, not taking them correctly can be harmful. To help ensure your treatment remains effective and safe, understand your medications and how to take them. Bring your medications to each visit for review by your provider.  Your lab results, notes, and after visit summary will be available on My Chart. We strongly encourage you to use this feature. If lab results are abnormal the clinic will contact you with the appropriate steps. If the clinic does not contact you assume the results are satisfactory. You can always view your results on My Chart. If you have questions regarding your health or results, please contact the clinic during office hours. You can also ask questions on My Chart.  We at [facility] are grateful that you chose us  to provide your care. We strive to provide evidence-based and compassionate care and are always looking for feedback. If you get a survey from the clinic please complete this so we can hear your opinions.  Healthy Eating, Adult Healthy eating may help you get and keep a healthy body weight, reduce the risk of chronic disease, and live a long and productive life. It is important to follow a healthy eating pattern. Your nutritional and calorie needs should be met mainly by different nutrient-rich foods. What are tips for following this plan? Reading food labels Read labels and choose the following: Reduced or low sodium products. Juices with 100% fruit juice. Foods with low saturated fats (<3 g per serving) and high polyunsaturated and monounsaturated fats. Foods with whole grains, such as  whole wheat, cracked wheat, brown rice, and wild rice. Whole grains that are fortified with folic acid. This is recommended for females who are pregnant or who want to become pregnant. Read labels and do not eat or drink the following: Foods or drinks with added sugars. These include foods that contain brown sugar, corn sweetener, corn syrup, dextrose , fructose, glucose, high-fructose corn syrup, honey, invert sugar, lactose, malt syrup, maltose, molasses, raw sugar, sucrose, trehalose, or turbinado sugar. Limit your intake of added sugars to less than 10% of your total daily calories. Do not eat more than the following amounts of added sugar per day: 6 teaspoons (25 g) for females. 9 teaspoons (38 g) for males. Foods that contain processed or refined starches and grains. Refined grain products, such as white flour, degermed cornmeal, white bread, and white rice. Shopping Choose nutrient-rich snacks, such as vegetables, whole fruits, and nuts. Avoid high-calorie and high-sugar snacks, such as potato chips, fruit snacks, and candy. Use oil-based dressings and spreads on foods instead of solid fats such as butter, margarine, sour cream, or cream cheese. Limit pre-made sauces, mixes, and instant products such as flavored rice, instant noodles, and ready-made pasta. Try more plant-protein sources, such as tofu, tempeh, black beans, edamame, lentils, nuts, and seeds. Explore eating plans such as the Mediterranean diet or vegetarian diet. Try heart-healthy dips made with beans and healthy fats like hummus and guacamole. Vegetables go great with these. Cooking Use oil to saut or stir-fry foods instead of solid fats such as butter, margarine, or lard. Try baking, boiling, grilling,  or broiling instead of frying. Remove the fatty part of meats before cooking. Steam vegetables in water  or broth. Meal planning  At meals, imagine dividing your plate into fourths: One-half of your plate is fruits and  vegetables. One-fourth of your plate is whole grains. One-fourth of your plate is protein, especially lean meats, poultry, eggs, tofu, beans, or nuts. Include low-fat dairy as part of your daily diet. Lifestyle Choose healthy options in all settings, including home, work, school, restaurants, or stores. Prepare your food safely: Wash your hands after handling raw meats. Where you prepare food, keep surfaces clean by regularly washing with hot, soapy water . Keep raw meats separate from ready-to-eat foods, such as fruits and vegetables. Cook seafood, meat, poultry, and eggs to the recommended temperature. Get a food thermometer. Store foods at safe temperatures. In general: Keep cold foods at 34F (4.4C) or below. Keep hot foods at 134F (60C) or above. Keep your freezer at [facility] (-17.8C) or below. Foods are not safe to eat if they have been between the temperatures of 40-134F (4.4-60C) for more than 2 hours. What foods should I eat? Fruits Aim to eat 1-2 cups of fresh, canned (in natural juice), or frozen fruits each day. One cup of fruit equals 1 small apple, 1 large banana, 8 large strawberries, 1 cup (237 g) canned fruit,  cup (82 g) dried fruit, or 1 cup (240 mL) 100% juice. Vegetables Aim to eat 2-4 cups of fresh and frozen vegetables each day, including different varieties and colors. One cup of vegetables equals 1 cup (91 g) broccoli or cauliflower florets, 2 medium carrots, 2 cups (150 g) raw, leafy greens, 1 large tomato, 1 large bell pepper, 1 large sweet potato, or 1 medium white potato. Grains Aim to eat 5-10 ounce-equivalents of whole grains each day. Examples of 1 ounce-equivalent of grains include 1 slice of bread, 1 cup (40 g) ready-to-eat cereal, 3 cups (24 g) popcorn, or  cup (93 g) cooked rice. Meats and other proteins Try to eat 5-7 ounce-equivalents of protein each day. Examples of 1 ounce-equivalent of protein include 1 egg,  oz nuts (12 almonds, 24 pistachios, or  7 walnut halves), 1/4 cup (90 g) cooked beans, 6 tablespoons (90 g) hummus or 1 tablespoon (16 g) peanut butter. A cut of meat or fish that is the size of a deck of cards is about 3-4 ounce-equivalents (85 g). Of the protein you eat each week, try to have at least 8 sounce (227 g) of seafood. This is about 2 servings per week. This includes salmon, trout, herring, sardines, and anchovies. Dairy Aim to eat 3 cup-equivalents of fat-free or low-fat dairy each day. Examples of 1 cup-equivalent of dairy include 1 cup (240 mL) milk, 8 ounces (250 g) yogurt, 1 ounces (44 g) natural cheese, or 1 cup (240 mL) fortified soy milk. Fats and oils Aim for about 5 teaspoons (21 g) of fats and oils per day. Choose monounsaturated fats, such as canola and olive oils, mayonnaise made with olive oil or avocado oil, avocados, peanut butter, and most nuts, or polyunsaturated fats, such as sunflower, corn, and soybean oils, walnuts, pine nuts, sesame seeds, sunflower seeds, and flaxseed. Beverages Aim for 6 eight-ounce glasses of water  per day. Limit coffee to 3-5 eight-ounce cups per day. Limit caffeinated beverages that have added calories, such as soda and energy drinks. If you drink alcohol: Limit how much you have to: 0-1 drink a day if you are female. 0-2 drinks a day  if you are female. Know how much alcohol is in your drink. In the [place] one drink is one 12 oz bottle of beer (355 mL), one 5 oz glass of wine (148 mL), or one 1 oz glass of hard liquor (44 mL). Seasoning and other foods Try not to add too much salt to your food. Try using herbs and spices instead of salt. Try not to add sugar to food. This information is based on [place] nutrition guidelines. To learn more, visit [employer].be. Exact amounts may vary. You may need different amounts. This information is not intended to replace advice given to you by your health care provider. Make sure you discuss any questions you have with your health care  provider. Document Revised: 12/21/2021 Document Reviewed: 12/21/2021 Elsevier Patient Education  2024 [employer].

## 2023-11-22 ENCOUNTER — Encounter: Payer: Self-pay | Admitting: Nurse Practitioner

## 2023-11-22 ENCOUNTER — Ambulatory Visit (INDEPENDENT_AMBULATORY_CARE_PROVIDER_SITE_OTHER): Payer: 59 | Admitting: Nurse Practitioner

## 2023-11-22 VITALS — BP 132/76 | HR 71 | Ht 67.0 in | Wt 266.6 lb

## 2023-11-22 DIAGNOSIS — C3491 Malignant neoplasm of unspecified part of right bronchus or lung: Secondary | ICD-10-CM | POA: Diagnosis not present

## 2023-11-22 DIAGNOSIS — E66813 Obesity, class 3: Secondary | ICD-10-CM

## 2023-11-22 DIAGNOSIS — E039 Hypothyroidism, unspecified: Secondary | ICD-10-CM | POA: Diagnosis not present

## 2023-11-22 DIAGNOSIS — E782 Mixed hyperlipidemia: Secondary | ICD-10-CM | POA: Diagnosis not present

## 2023-11-22 DIAGNOSIS — R229 Localized swelling, mass and lump, unspecified: Secondary | ICD-10-CM | POA: Insufficient documentation

## 2023-11-22 DIAGNOSIS — Z6841 Body Mass Index (BMI) 40.0 and over, adult: Secondary | ICD-10-CM

## 2023-11-22 NOTE — Assessment & Plan Note (Signed)
 Diagnosed in January 2025.  Continue collaboration with oncology.  Recent notes and labs reviewed.

## 2023-11-22 NOTE — Assessment & Plan Note (Signed)
BMI 41.76.  Recommended eating smaller high protein, low fat meals more frequently and exercising 30 mins a day 5 times a week with a goal of 10-15lb weight loss in the next 3 months. Patient voiced their understanding and motivation to adhere to these recommendations.

## 2023-11-22 NOTE — Progress Notes (Signed)
 BP 132/76   Pulse 71   Ht 5' 7 (1.702 m)   Wt 266 lb 9.6 oz (120.9 kg)   LMP 09/17/2013 (Approximate)   SpO2 97%   BMI 41.76 kg/m    Subjective:    Patient ID: Ann Robinson, female    DOB: November 05, 1960, 63 y.o.   MRN: [id number]  HPI: Ann Robinson is a 63 y.o. female  Chief Complaint  Patient presents with   Hyperlipidemia   Hypothyroidism   Lung Mass   HYPERLIPIDEMIA Taking Rosuvastatin . Hyperlipidemia status: good compliance Satisfied with current treatment?  yes Side effects:  no Medication compliance: good compliance Supplements: none Aspirin:  no The 10-year ASCVD risk score (Arnett DK, et al., 2019) is: 6.6%   Values used to calculate the score:     Age: 70 years     Clincally relevant sex: Female     Is Non-Hispanic African American: No     Diabetic: No     Tobacco smoker: No     Systolic Blood Pressure: 132 mmHg     Is BP treated: No     HDL Cholesterol: 27 mg/dL     Total Cholesterol: 185 mg/dL Chest pain:  no Coronary artery disease:  no Family history CAD:  no Family history early CAD:  no   HYPOTHYROIDISM Scheduled to see her thyroid  specialist October 9th. Taking Levothyroxine . Thyroid  control status:stable Satisfied with current treatment? yes Medication side effects: no Medication compliance: good compliance Etiology of hypothyroidism:  Recent dose adjustment:no Fatigue: no - improving Cold intolerance: no Heat intolerance: no Weight gain: no Weight loss: no Constipation: no Diarrhea/loose stools: no Palpitations: no Lower extremity edema: no Anxiety/depressed mood: no   LUNG MASS Following with [facility] for non-small cell cancer right lung. Had lobectomy on 09/27/23.  Last oncology visit 11/09/23.  Taking Tagrisso for 3 years, is tolerating well. Did chemo for a period with last treatment on 07/28/23.  Has hard knot to medial left calf, has been present for 3 weeks and is getting bigger per patient.  She did show oncology, no  imaging done as of yet.  She reports having occasional discomfort to behind left knee and extends up thigh. Recurrent headaches: no Visual changes: no Palpitations: no Dyspnea: no Chest pain: no Lower extremity edema: no Dizzy/lightheaded: no   Relevant past medical, surgical, family and social history reviewed and updated as indicated. Interim medical history since our last visit reviewed. Allergies and medications reviewed and updated.  Review of Systems  Constitutional:  Negative for activity change, appetite change, diaphoresis, fatigue and fever.  Respiratory:  Negative for cough, chest tightness, shortness of breath and wheezing.   Cardiovascular:  Negative for chest pain, palpitations and leg swelling.  Gastrointestinal: Negative.   Neurological:  Negative for dizziness, syncope, weakness, light-headedness, numbness and headaches.  Psychiatric/Behavioral: Negative.      Per HPI unless specifically indicated above     Objective:    BP 132/76   Pulse 71   Ht 5' 7 (1.702 m)   Wt 266 lb 9.6 oz (120.9 kg)   LMP 09/17/2013 (Approximate)   SpO2 97%   BMI 41.76 kg/m   Wt Readings from Last 3 Encounters:  11/22/23 266 lb 9.6 oz (120.9 kg)  06/06/23 270 lb (122.5 kg)  05/24/23 270 lb 14.4 oz (122.9 kg)    Physical Exam Vitals and nursing note reviewed.  Constitutional:      General: She is awake. She is not in acute distress.  Appearance: She is well-developed and well-groomed. She is obese. She is not ill-appearing or toxic-appearing.  HENT:     Head: Normocephalic.     Right Ear: Hearing and external ear normal.     Left Ear: Hearing and external ear normal.  Eyes:     General: Lids are normal.        Right eye: No discharge.        Left eye: No discharge.     Conjunctiva/sclera: Conjunctivae normal.     Pupils: Pupils are equal, round, and reactive to light.  Neck:     Thyroid : No thyromegaly.     Vascular: No carotid bruit.  Cardiovascular:     Rate and  Rhythm: Normal rate and regular rhythm.     Heart sounds: Normal heart sounds. No murmur heard.    No gallop.  Pulmonary:     Effort: Pulmonary effort is normal. No accessory muscle usage or respiratory distress.     Breath sounds: Normal breath sounds.  Abdominal:     General: Bowel sounds are normal. There is no distension.     Palpations: Abdomen is soft.     Tenderness: There is no abdominal tenderness.  Musculoskeletal:     Cervical back: Normal range of motion and neck supple.     Right lower leg: No edema.     Left lower leg: No edema.  Lymphadenopathy:     Cervical: No cervical adenopathy.  Skin:    General: Skin is warm and dry.      Neurological:     Mental Status: She is alert and oriented to person, place, and time.     Deep Tendon Reflexes: Reflexes are normal and symmetric.     Reflex Scores:      Brachioradialis reflexes are 2+ on the right side and 2+ on the left side.      Patellar reflexes are 2+ on the right side and 2+ on the left side. Psychiatric:        Attention and Perception: Attention normal.        Mood and Affect: Mood normal.        Speech: Speech normal.        Behavior: Behavior normal. Behavior is cooperative.        Thought Content: Thought content normal.       Latest Ref Rng & Units 04/28/2023    3:48 PM 03/21/2023    1:43 PM 02/22/2023    1:35 PM  CBC  WBC 4.0 - 10.5 K/uL 6.0  5.1  6.2   Hemoglobin 12.0 - 15.0 g/dL 87.0  85.8  86.6   Hematocrit 36.0 - 46.0 % 39.3  42.7  40.7   Platelets 150 - 400 K/uL 229  252  291        Latest Ref Rng & Units 04/28/2023    3:48 PM 03/21/2023    1:43 PM 02/22/2023    1:35 PM  CMP  Glucose 70 - 99 mg/dL 896  889  80   BUN 8 - 23 mg/dL 8  21  10    Creatinine 0.44 - 1.00 mg/dL 9.28  9.15  9.05   Sodium 135 - 145 mmol/L 140  133  140   Potassium 3.5 - 5.1 mmol/L 4.0  4.2  4.1   Chloride 98 - 111 mmol/L 107  99  104   CO2 22 - 32 mmol/L 25  20  24    Calcium  8.9 - 10.3 mg/dL 9.3  8.5  9.1   Total  Protein 6.5 - 8.1 g/dL 6.9  5.3  6.2   Total Bilirubin 0.0 - 1.2 mg/dL 0.8  0.3  0.3   Alkaline Phos 38 - 126 U/L 50  82  76   AST 15 - 41 U/L 16  16  22    ALT 0 - 44 U/L 19  13  18         Assessment & Plan:   Problem List Items Addressed This Visit       Respiratory   Non-small cell lung cancer (HCC) - Primary   Diagnosed in January 2025.  Continue collaboration with oncology.  Recent notes and labs reviewed.        Relevant Medications   osimertinib mesylate (TAGRISSO) 80 MG tablet     Endocrine   Hypothyroidism   Chronic, ongoing.  Followed by endocrinology.  Recommend continuing current dose and reviewed endo's recent note.  Thyroid  labs with endo.        Other   Obesity   BMI 41.76.  Recommended eating smaller high protein, low fat meals more frequently and exercising 30 mins a day 5 times a week with a goal of 10-15lb weight loss in the next 3 months. Patient voiced their understanding and motivation to adhere to these recommendations.       Mass of skin   ?if bug bite vs cyst.  She is having occasional pain and had recent surgery + undergoing cancer treatment.  May benefit from venous doppler of left lower extremity to assess blood flow and ensure no DVT + this may also show if cyst to area of concern.  She will discuss with oncology.  Could also consider soft tissue ultrasound to area of mass. No s/s infection on exam.      Hyperlipemia, mixed   Chronic, ongoing.  Did not tolerate Atorvastatin .  Continue Crestor  at low dose every other day, could try increase if needed but concern for myalgia with this. She will see if oncology can obtain lipid panel on her next labs.        Follow up plan: Return in about 6 months (around 05/24/2024) for LUNG MASS, HLD, HYPOTHYROID.

## 2023-11-22 NOTE — Assessment & Plan Note (Signed)
 Chronic, ongoing.  Did not tolerate Atorvastatin .  Continue Crestor  at low dose every other day, could try increase if needed but concern for myalgia with this. She will see if oncology can obtain lipid panel on her next labs.

## 2023-11-22 NOTE — Assessment & Plan Note (Signed)
 Chronic, ongoing.  Followed by endocrinology.  Recommend continuing current dose and reviewed endo's recent note.  Thyroid  labs with endo.

## 2023-11-22 NOTE — Assessment & Plan Note (Signed)
?  if bug bite vs cyst.  She is having occasional pain and had recent surgery + undergoing cancer treatment.  May benefit from venous doppler of left lower extremity to assess blood flow and ensure no DVT + this may also show if cyst to area of concern.  She will discuss with oncology.  Could also consider soft tissue ultrasound to area of mass. No s/s infection on exam.

## 2024-05-29 ENCOUNTER — Ambulatory Visit: Admitting: Nurse Practitioner
